# Patient Record
Sex: Female | Born: 1965 | Race: Black or African American | Hispanic: No | Marital: Single | Smoking: Current every day smoker
Health system: Southern US, Community
[De-identification: ages and names within clinical notes are randomized; demographics above are authoritative.]

## PROBLEM LIST (undated history)

## (undated) DIAGNOSIS — I739 Peripheral vascular disease, unspecified: Secondary | ICD-10-CM

## (undated) DIAGNOSIS — I10 Essential (primary) hypertension: Secondary | ICD-10-CM

## (undated) DIAGNOSIS — E119 Type 2 diabetes mellitus without complications: Secondary | ICD-10-CM

## (undated) DIAGNOSIS — I519 Heart disease, unspecified: Secondary | ICD-10-CM

## (undated) DIAGNOSIS — Z8619 Personal history of other infectious and parasitic diseases: Secondary | ICD-10-CM

## (undated) DIAGNOSIS — F32A Depression, unspecified: Secondary | ICD-10-CM

## (undated) DIAGNOSIS — I480 Paroxysmal atrial fibrillation: Secondary | ICD-10-CM

## (undated) DIAGNOSIS — F319 Bipolar disorder, unspecified: Secondary | ICD-10-CM

## (undated) DIAGNOSIS — E785 Hyperlipidemia, unspecified: Secondary | ICD-10-CM

## (undated) DIAGNOSIS — F329 Major depressive disorder, single episode, unspecified: Secondary | ICD-10-CM

## (undated) DIAGNOSIS — I499 Cardiac arrhythmia, unspecified: Secondary | ICD-10-CM

## (undated) HISTORY — DX: Paroxysmal atrial fibrillation: I48.0

## (undated) HISTORY — PX: WISDOM TOOTH EXTRACTION: SHX21

## (undated) HISTORY — DX: Peripheral vascular disease, unspecified: I73.9

## (undated) HISTORY — DX: Hyperlipidemia, unspecified: E78.5

## (undated) HISTORY — DX: Heart disease, unspecified: I51.9

## (undated) HISTORY — DX: Personal history of other infectious and parasitic diseases: Z86.19

## (undated) HISTORY — PX: MULTIPLE TOOTH EXTRACTIONS: SHX2053

---
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
## 1999-07-08 HISTORY — PX: ABDOMINAL HYSTERECTOMY: SHX81

## 2014-02-10 ENCOUNTER — Emergency Department (HOSPITAL_BASED_OUTPATIENT_CLINIC_OR_DEPARTMENT_OTHER)
Admission: EM | Admit: 2014-02-10 | Discharge: 2014-02-10 | Disposition: A | Discharge status: Home / Self Care | Payer: Managed Care, Other (non HMO) | Attending: Emergency Medicine | Admitting: Emergency Medicine

## 2014-02-10 ENCOUNTER — Encounter (HOSPITAL_BASED_OUTPATIENT_CLINIC_OR_DEPARTMENT_OTHER): Payer: Self-pay | Admitting: Emergency Medicine

## 2014-02-10 DIAGNOSIS — F17200 Nicotine dependence, unspecified, uncomplicated: Secondary | ICD-10-CM | POA: Insufficient documentation

## 2014-02-10 DIAGNOSIS — I10 Essential (primary) hypertension: Secondary | ICD-10-CM | POA: Insufficient documentation

## 2014-02-10 DIAGNOSIS — F329 Major depressive disorder, single episode, unspecified: Secondary | ICD-10-CM | POA: Insufficient documentation

## 2014-02-10 DIAGNOSIS — F3289 Other specified depressive episodes: Secondary | ICD-10-CM | POA: Insufficient documentation

## 2014-02-10 DIAGNOSIS — N390 Urinary tract infection, site not specified: Secondary | ICD-10-CM | POA: Insufficient documentation

## 2014-02-10 DIAGNOSIS — N898 Other specified noninflammatory disorders of vagina: Secondary | ICD-10-CM | POA: Insufficient documentation

## 2014-02-10 DIAGNOSIS — Z79899 Other long term (current) drug therapy: Secondary | ICD-10-CM | POA: Insufficient documentation

## 2014-02-10 DIAGNOSIS — A5901 Trichomonal vulvovaginitis: Secondary | ICD-10-CM

## 2014-02-10 HISTORY — DX: Major depressive disorder, single episode, unspecified: F32.9

## 2014-02-10 HISTORY — DX: Depression, unspecified: F32.A

## 2014-02-10 HISTORY — DX: Essential (primary) hypertension: I10

## 2014-02-10 LAB — URINALYSIS, ROUTINE W REFLEX MICROSCOPIC
BILIRUBIN URINE: NEGATIVE
Glucose, UA: NEGATIVE mg/dL
KETONES UR: NEGATIVE mg/dL
NITRITE: NEGATIVE
PH: 5 (ref 5.0–8.0)
PROTEIN: 30 mg/dL — AB
Specific Gravity, Urine: 1.03 (ref 1.005–1.030)
Urobilinogen, UA: 0.2 mg/dL (ref 0.0–1.0)

## 2014-02-10 LAB — URINE MICROSCOPIC-ADD ON

## 2014-02-10 LAB — WET PREP, GENITAL: YEAST WET PREP: NONE SEEN

## 2014-02-10 MED ORDER — CEFTRIAXONE SODIUM 250 MG IJ SOLR
250.0000 mg | Freq: Once | INTRAMUSCULAR | Status: AC
  Administered 2014-02-10: 250 mg via INTRAMUSCULAR
  Filled 2014-02-10: qty 250

## 2014-02-10 MED ORDER — METRONIDAZOLE 500 MG PO TABS
2000.0000 mg | ORAL_TABLET | Freq: Once | ORAL | Status: AC
  Administered 2014-02-10: 2000 mg via ORAL
  Filled 2014-02-10: qty 4

## 2014-02-10 MED ORDER — NITROFURANTOIN MONOHYD MACRO 100 MG PO CAPS: 100.0000 mg | ORAL_CAPSULE | Freq: Two times a day (BID) | ORAL | Status: DC

## 2014-02-10 MED ORDER — FLUCONAZOLE 150 MG PO TABS: 150.0000 mg | ORAL_TABLET | Freq: Every day | ORAL | Status: AC

## 2014-02-10 MED ORDER — ONDANSETRON 4 MG PO TBDP
4.0000 mg | ORAL_TABLET | Freq: Once | ORAL | Status: AC
  Administered 2014-02-10: 4 mg via ORAL
  Filled 2014-02-10: qty 1

## 2014-02-10 NOTE — Discharge Instructions (Signed)
Urinary Tract Infection °Urinary tract infections (UTIs) can develop anywhere along your urinary tract. Your urinary tract is your body's drainage system for removing wastes and extra water. Your urinary tract includes two kidneys, two ureters, a bladder, and a urethra. Your kidneys are a pair of bean-shaped organs. Each kidney is about the size of your fist. They are located below your ribs, one on each side of your spine. °CAUSES °Infections are caused by microbes, which are microscopic organisms, including fungi, viruses, and bacteria. These organisms are so small that they can only be seen through a microscope. Bacteria are the microbes that most commonly cause UTIs. °SYMPTOMS  °Symptoms of UTIs may vary by age and gender of the patient and by the location of the infection. Symptoms in young women typically include a frequent and intense urge to urinate and a painful, burning feeling in the bladder or urethra during urination. Older women and men are more likely to be tired, shaky, and weak and have muscle aches and abdominal pain. A fever may mean the infection is in your kidneys. Other symptoms of a kidney infection include pain in your back or sides below the ribs, nausea, and vomiting. °DIAGNOSIS °To diagnose a UTI, your caregiver will ask you about your symptoms. Your caregiver also will ask to provide a urine sample. The urine sample will be tested for bacteria and white blood cells. White blood cells are made by your body to help fight infection. °TREATMENT  °Typically, UTIs can be treated with medication. Because most UTIs are caused by a bacterial infection, they usually can be treated with the use of antibiotics. The choice of antibiotic and length of treatment depend on your symptoms and the type of bacteria causing your infection. °HOME CARE INSTRUCTIONS °· If you were prescribed antibiotics, take them exactly as your caregiver instructs you. Finish the medication even if you feel better after you  have only taken some of the medication. °· Drink enough water and fluids to keep your urine clear or pale yellow. °· Avoid caffeine, tea, and carbonated beverages. They tend to irritate your bladder. °· Empty your bladder often. Avoid holding urine for long periods of time. °· Empty your bladder before and after sexual intercourse. °· After a bowel movement, women should cleanse from front to back. Use each tissue only once. °SEEK MEDICAL CARE IF:  °· You have back pain. °· You develop a fever. °· Your symptoms do not begin to resolve within 3 days. °SEEK IMMEDIATE MEDICAL CARE IF:  °· You have severe back pain or lower abdominal pain. °· You develop chills. °· You have nausea or vomiting. °· You have continued burning or discomfort with urination. °MAKE SURE YOU:  °· Understand these instructions. °· Will watch your condition. °· Will get help right away if you are not doing well or get worse. °Document Released: 04/02/2005 Document Revised: 12/23/2011 Document Reviewed: 08/01/2011 °ExitCare® Patient Information ©2015 [employer]. This information is not intended to replace advice given to you by your health care provider. Make sure you discuss any questions you have with your health care provider. °Trichomoniasis °Trichomoniasis is an infection caused by an organism called Trichomonas. The infection can affect both women and men. In women, the outer female genitalia and the vagina are affected. In men, the penis is mainly affected, but the prostate and other reproductive organs can also be involved. Trichomoniasis is a sexually transmitted infection (STI) and is most often passed to another person through sexual contact.  °  RISK FACTORS °· Having unprotected sexual intercourse. °· Having sexual intercourse with an infected partner. °SIGNS AND SYMPTOMS  °Symptoms of trichomoniasis in women include: °· Abnormal gray-green frothy vaginal discharge. °· Itching and irritation of the vagina. °· Itching and irritation  of the area outside the vagina. °Symptoms of trichomoniasis in men include:  °· Penile discharge with or without pain. °· Pain during urination. This results from inflammation of the urethra. °DIAGNOSIS  °Trichomoniasis may be found during a Pap test or physical exam. Your health care provider may use one of the following methods to help diagnose this infection: °· Examining vaginal discharge under a microscope. For men, urethral discharge would be examined. °· Testing the pH of the vagina with a test tape. °· Using a vaginal swab test that checks for the Trichomonas organism. A test is available that provides results within a few minutes. °· Doing a culture test for the organism. This is not usually needed. °TREATMENT  °· You may be given medicine to fight the infection. Women should inform their health care provider if they could be or are pregnant. Some medicines used to treat the infection should not be taken during pregnancy. °· Your health care provider may recommend over-the-counter medicines or creams to decrease itching or irritation. °· Your sexual partner will need to be treated if infected. °HOME CARE INSTRUCTIONS  °· Take medicines only as directed by your health care provider. °· Take over-the-counter medicine for itching or irritation as directed by your health care provider. °· Do not have sexual intercourse while you have the infection. °· Women should not douche or wear tampons while they have the infection. °· Discuss your infection with your partner. Your partner may have gotten the infection from you, or you may have gotten it from your partner. °· Have your sex partner get examined and treated if necessary. °· Practice safe, informed, and protected sex. °· See your health care provider for other STI testing. °SEEK MEDICAL CARE IF:  °· You still have symptoms after you finish your medicine. °· You develop abdominal pain. °· You have pain when you urinate. °· You have bleeding after sexual  intercourse. °· You develop a rash. °· Your medicine makes you sick or makes you throw up (vomit). °MAKE SURE YOU: °· Understand these instructions. °· Will watch your condition. °· Will get help right away if you are not doing well or get worse. °Document Released: 12/17/2000 Document Revised: 11/07/2013 Document Reviewed: 04/04/2013 °ExitCare® Patient Information ©2015 [employer]. This information is not intended to replace advice given to you by your health care provider. Make sure you discuss any questions you have with your health care provider. ° °

## 2014-02-10 NOTE — ED Provider Notes (Signed)
CSN: [id number]     Arrival date & time 02/10/14  1754 History  This chart was scribed for Rachael Camel, MD by Luisa Dago, ED Scribe. This patient was seen in room MH01/MH01 and the patient's care was started at 7:12 PM.    Chief Complaint  Patient presents with  . Vaginal Discharge   The history is provided by the patient. No language interpreter was used.   HPI Comments: Rachael Becker is a 48 y.o. female who presents to the Emergency Department complaining of vaginal discharge that started approximately 4 days ago. Pt is also complaining of associated vaginal irritation and dysuria. Pt states that she's been using vagisil with no relief. She also states that last week she switched to a new detergent and that's when she noticed the vaginal irritation. Pt also states that she recently started having sex again and is concerned that the vaginal irritation could be due to her usage of latex condoms. Has been having some dysuria as well. She denies any other pertinent medical history. Denies any abdominal pain, back pain, vomiting or fevers.    Past Medical History  Diagnosis Date  . Hypertension   . Depression    Past Surgical History  Procedure Laterality Date  . Abdominal hysterectomy     No family history on file. History  Substance Use Topics  . Smoking status: Current Every Day Smoker  . Smokeless tobacco: Not on file  . Alcohol Use: Not on file   OB History   Grav Para Term Preterm Abortions TAB SAB Ect Mult Living                 Review of Systems  Constitutional: Negative for fever and chills.  Gastrointestinal: Negative for nausea, vomiting and abdominal pain.  Genitourinary: Positive for dysuria and vaginal discharge. Negative for flank pain.  Musculoskeletal: Negative for back pain.  All other systems reviewed and are negative.  Allergies  Review of patient's allergies indicates no known allergies.  Home Medications   Prior to Admission medications    Medication Sig Start Date End Date Taking? Authorizing Provider  carvedilol (COREG) 25 MG tablet Take 25 mg by mouth 2 (two) times daily with a meal.   Yes Historical Provider, MD  citalopram (CELEXA) 40 MG tablet Take 40 mg by mouth daily.   Yes Historical Provider, MD   BP 164/98  Pulse 77  Temp(Src) 97.5 F (36.4 C)  Resp 16  Ht  (1.651 m)  Wt 210 lb (95.255 kg)  BMI 34.95 kg/m2  SpO2 95%  Physical Exam  Nursing note and vitals reviewed. Constitutional: She is oriented to person, place, and time. She appears well-developed and well-nourished. No distress.  HENT:  Head: Normocephalic and atraumatic.  Eyes: EOM are normal.  Neck: Neck supple.  Cardiovascular: Normal rate, regular rhythm and normal heart sounds.   Pulmonary/Chest: Effort normal and breath sounds normal. No respiratory distress.  Abdominal: Soft. She exhibits no distension. There is no tenderness. There is no CVA tenderness.  Genitourinary: Uterus is not enlarged and not tender. Cervix exhibits no motion tenderness and no friability. Vaginal discharge (yellow) found.  Musculoskeletal: Normal range of motion.  Neurological: She is alert and oriented to person, place, and time.  Skin: Skin is warm and dry.  Psychiatric: She has a normal mood and affect. Her behavior is normal.    ED Course  Procedures (including critical care time)  DIAGNOSTIC STUDIES: Oxygen Saturation is 95% on RA, low by  my interpretation.    COORDINATION OF CARE: 7:17 PM- Will order a UA. Pt advised of plan for treatment and pt agrees.  Labs Review Labs Reviewed  WET PREP, GENITAL - Abnormal; Notable for the following:    Trich, Wet Prep MODERATE (*)    Clue Cells Wet Prep HPF POC FEW (*)    WBC, Wet Prep HPF POC TOO NUMEROUS TO COUNT (*)    All other components within normal limits  URINALYSIS, ROUTINE W REFLEX MICROSCOPIC - Abnormal; Notable for the following:    APPearance CLOUDY (*)    Hgb urine dipstick LARGE (*)     Protein, ur 30 (*)    Leukocytes, UA MODERATE (*)    All other components within normal limits  URINE MICROSCOPIC-ADD ON - Abnormal; Notable for the following:    Bacteria, UA MANY (*)    All other components within normal limits  GC/CHLAMYDIA PROBE AMP  URINE CULTURE    Imaging Review No results found.   EKG Interpretation None      MDM   Final diagnoses:  UTI (lower urinary tract infection)  Trichomonal vaginitis    Patient's wife is consistent with trichomoniasis. Given this she was also treated for GC/chlamydia. Tolerated meds well in the ED. We'll treat her likely UTI with macrobid. Patient has no signs of pyelonephritis, going to fever, flank/back pain, or at this time will discharge and recommend return if any of these symptoms develop.  I personally performed the services described in this documentation, which was scribed in my presence. The recorded information has been reviewed and is accurate.    Rachael Camel, MD 02/10/14 [phone]

## 2014-02-10 NOTE — ED Notes (Signed)
Pt having vaginal irritation and discharge since Tuesday.  Pt has recently changed detergent.  Pt states she is having pink tinged discharge.  Some dysuria.

## 2014-02-11 LAB — GC/CHLAMYDIA PROBE AMP
CT Probe RNA: NEGATIVE
GC Probe RNA: NEGATIVE

## 2014-02-12 LAB — URINE CULTURE: Colony Count: >=100000

## 2014-07-07 DIAGNOSIS — I2510 Atherosclerotic heart disease of native coronary artery without angina pectoris: Secondary | ICD-10-CM

## 2014-07-07 HISTORY — DX: Atherosclerotic heart disease of native coronary artery without angina pectoris: I25.10

## 2014-07-18 HISTORY — PX: BYPASS GRAFT: SHX909

## 2014-08-23 ENCOUNTER — Ambulatory Visit (INDEPENDENT_AMBULATORY_CARE_PROVIDER_SITE_OTHER): Payer: Managed Care, Other (non HMO) | Admitting: Psychology

## 2014-08-23 DIAGNOSIS — F4323 Adjustment disorder with mixed anxiety and depressed mood: Secondary | ICD-10-CM

## 2014-08-28 ENCOUNTER — Encounter: Payer: Self-pay | Admitting: Physician Assistant

## 2014-08-28 ENCOUNTER — Telehealth: Payer: Self-pay | Admitting: Physician Assistant

## 2014-08-28 ENCOUNTER — Ambulatory Visit (INDEPENDENT_AMBULATORY_CARE_PROVIDER_SITE_OTHER): Payer: Managed Care, Other (non HMO) | Admitting: Physician Assistant

## 2014-08-28 VITALS — BP 107/74 | HR 81 | Temp 98.4°F | Resp 16 | Ht 64.5 in | Wt 250.5 lb

## 2014-08-28 DIAGNOSIS — F419 Anxiety disorder, unspecified: Principal | ICD-10-CM

## 2014-08-28 DIAGNOSIS — E785 Hyperlipidemia, unspecified: Secondary | ICD-10-CM

## 2014-08-28 DIAGNOSIS — I25709 Atherosclerosis of coronary artery bypass graft(s), unspecified, with unspecified angina pectoris: Secondary | ICD-10-CM | POA: Insufficient documentation

## 2014-08-28 DIAGNOSIS — I10 Essential (primary) hypertension: Secondary | ICD-10-CM

## 2014-08-28 DIAGNOSIS — F418 Other specified anxiety disorders: Secondary | ICD-10-CM

## 2014-08-28 DIAGNOSIS — F32A Depression, unspecified: Secondary | ICD-10-CM

## 2014-08-28 DIAGNOSIS — F329 Major depressive disorder, single episode, unspecified: Secondary | ICD-10-CM | POA: Insufficient documentation

## 2014-08-28 MED ORDER — CITALOPRAM HYDROBROMIDE 40 MG PO TABS: 40.0000 mg | ORAL_TABLET | Freq: Every day | ORAL | Status: DC

## 2014-08-28 MED ORDER — ALPRAZOLAM 1 MG PO TABS: 1.0000 mg | ORAL_TABLET | Freq: Every evening | ORAL | Status: DC | PRN

## 2014-08-28 MED ORDER — LISINOPRIL 10 MG PO TABS: 10.0000 mg | ORAL_TABLET | Freq: Every day | ORAL | Status: DC

## 2014-08-28 MED ORDER — LISINOPRIL 20 MG PO TABS: 20.0000 mg | ORAL_TABLET | Freq: Every day | ORAL | Status: DC

## 2014-08-28 MED ORDER — CITALOPRAM HYDROBROMIDE 20 MG PO TABS: 20.0000 mg | ORAL_TABLET | Freq: Every day | ORAL | Status: DC

## 2014-08-28 MED ORDER — HYDROCHLOROTHIAZIDE 25 MG PO TABS: 25.0000 mg | ORAL_TABLET | Freq: Every day | ORAL | Status: AC

## 2014-08-28 MED ORDER — ATORVASTATIN CALCIUM 80 MG PO TABS: 40.0000 mg | ORAL_TABLET | Freq: Every day | ORAL | Status: DC

## 2014-08-28 NOTE — Patient Instructions (Signed)
Please continue medications as directed.  I will fax over a refill of your Xanax once I get confirmation on the dose from your pharmacy.   I would like for you to follow-up in 1 month to reassess your BP levels.  We can do your physical at that time.  Welcome to [facility]!  Hypertension Hypertension, commonly called high blood pressure, is when the force of blood pumping through your arteries is too strong. Your arteries are the blood vessels that carry blood from your heart throughout your body. A blood pressure reading consists of a higher number over a lower number, such as 110/72. The higher number (systolic) is the pressure inside your arteries when your heart pumps. The lower number (diastolic) is the pressure inside your arteries when your heart relaxes. Ideally you want your blood pressure below 120/80. Hypertension forces your heart to work harder to pump blood. Your arteries may become narrow or stiff. Having hypertension puts you at risk for heart disease, stroke, and other problems.  RISK FACTORS Some risk factors for high blood pressure are controllable. Others are not.  Risk factors you cannot control include:   Race. You may be at higher risk if you are African American.  Age. Risk increases with age.  Gender. Men are at higher risk than women before age 51 years. After age 51, women are at higher risk than men. Risk factors you can control include:  Not getting enough exercise or physical activity.  Being overweight.  Getting too much fat, sugar, calories, or salt in your diet.  Drinking too much alcohol. SIGNS AND SYMPTOMS Hypertension does not usually cause signs or symptoms. Extremely high blood pressure (hypertensive crisis) may cause headache, anxiety, shortness of breath, and nosebleed. DIAGNOSIS  To check if you have hypertension, your health care provider will measure your blood pressure while you are seated, with your arm held at the level of your heart. It should  be measured at least twice using the same arm. Certain conditions can cause a difference in blood pressure between your right and left arms. A blood pressure reading that is higher than normal on one occasion does not mean that you need treatment. If one blood pressure reading is high, ask your health care provider about having it checked again. TREATMENT  Treating high blood pressure includes making lifestyle changes and possibly taking medicine. Living a healthy lifestyle can help lower high blood pressure. You may need to change some of your habits. Lifestyle changes may include:  Following the DASH diet. This diet is high in fruits, vegetables, and whole grains. It is low in salt, red meat, and added sugars.  Getting at least 2 hours of brisk physical activity every week.  Losing weight if necessary.  Not smoking.  Limiting alcoholic beverages.  Learning ways to reduce stress. If lifestyle changes are not enough to get your blood pressure under control, your health care provider may prescribe medicine. You may need to take more than one. Work closely with your health care provider to understand the risks and benefits. HOME CARE INSTRUCTIONS  Have your blood pressure rechecked as directed by your health care provider.   Take medicines only as directed by your health care provider. Follow the directions carefully. Blood pressure medicines must be taken as prescribed. The medicine does not work as well when you skip doses. Skipping doses also puts you at risk for problems.   Do not smoke.   Monitor your blood pressure at home as directed  by your health care provider. SEEK MEDICAL CARE IF:   You think you are having a reaction to medicines taken.  You have recurrent headaches or feel dizzy.  You have swelling in your ankles.  You have trouble with your vision. SEEK IMMEDIATE MEDICAL CARE IF:  You develop a severe headache or confusion.  You have unusual weakness,  numbness, or feel faint.  You have severe chest or abdominal pain.  You vomit repeatedly.  You have trouble breathing. MAKE SURE YOU:   Understand these instructions.  Will watch your condition.  Will get help right away if you are not doing well or get worse. Document Released: 06/23/2005 Document Revised: 11/07/2013 Document Reviewed: 04/15/2013 [facility] Patient Information 2015 [place], [place]. This information is not intended to replace advice given to you by your health care provider. Make sure you discuss any questions you have with your health care provider.

## 2014-08-28 NOTE — Telephone Encounter (Signed)
Attempt to reach [facility] 3 times w/o answer this Am/SLS 02.22.16

## 2014-08-28 NOTE — Telephone Encounter (Signed)
Patient had stated at her appointment today that she was on Xanax PRN but did not know the dose.  Her pharmacy has faxed over all filled medication and it does not include Xanax.  Reviewing the [place] Controlled [place]bstance Database, it seem she was on Lorazepam 0.5 mg PRN previously.  I want to double check with her regarding the Lorazepam before I send in a refill.

## 2014-08-28 NOTE — Progress Notes (Signed)
Pre visit review using our clinic review tool, if applicable. No additional management support is needed unless otherwise documented below in the visit note/SLS  

## 2014-08-28 NOTE — Assessment & Plan Note (Signed)
Doing well.  Has scheduled follow-up with Cardiology. BP well controlled.  Continue cholesterol and BP regimen.  Continue 81 mg ASA daily.  Will return for CPE with fasting labs to assess cholesterol.

## 2014-08-28 NOTE — Telephone Encounter (Signed)
Please attempt to reach again as we need an updated med list to verify dosing of medications.

## 2014-08-28 NOTE — Assessment & Plan Note (Signed)
Well controlled. Medications refilled.  Continue current regimen.  Exercise encouraged.  Patient to return for CPE with fasting labs.

## 2014-08-28 NOTE — Assessment & Plan Note (Signed)
Medications refilled. Continue current regimen.  Diet and exercise regimen discussed.

## 2014-08-28 NOTE — Telephone Encounter (Signed)
Spoke with Pharmacist at [facility] listed in patient's chart. Has old Rx for Xanax 1 mg.  Will refill that to use PRN with quantity 30 to send to her pharmacy ([facility]).   Reviewing the medication list provided by [facility], it seems she was inaccurate about most of her medications, unless recent changes had been made by her Cardiologist. Please contact patient and have her use her old Rx bottles to help find correct medication doses:  It seems she should be taking 10 mg Lisinopril instead of 20 like she stated.  Since medication was refilled, she will need to take 1/2 tablet daily to get the right dose. For the Citalopram, they have her taking 20 mg daily instead of 40 mg daily.  If this is correct, she is to take 1/2 tablet daily to get the correct dose. For the Atorvastatin, [facility] states she has been getting 80 mg tablets of the Lipitor instead of 20 mg she reported.  If this is accurate, then I want her to at least take 2 tablets (40 mg daily) until I can recheck her cholesterol at her CPE.  She is to bring all Rx pill bottles to her appointments from now on.  I want her to return in 2 weeks for her CPE and medication management.

## 2014-08-28 NOTE — Telephone Encounter (Signed)
Spoke with patient and informed her of the fax that I received from [facility] with different dosing instructions for her medications and had patient get her bottles of medications to verify what was received from the pharmacy; resent Rx to pharmacy with new/correct dosing instructions. Patient understood & agreed/SLS 02.22.16

## 2014-08-28 NOTE — Assessment & Plan Note (Signed)
Citalopram refilled.  Continue daily as directed.  Will call pharmacy to assess previous Xanax dose.  Will refill based on what pharmacy ellicits. Follow-up in 1 month.

## 2014-08-28 NOTE — Progress Notes (Signed)
Patient presents to clinic today to establish care.  Patient with history of hypertension, hyperlipidemia and coronary artery disease s/p bypass grafting on 07/18/14.  Is currently on regimen of Carvedilol 25 mg BID, HCTZ 25 mg QD, and lisinopril 20 mg daily for BP.  Endorses good control of BP. Patient denies chest pain, palpitations, lightheadedness, dizziness, vision changes or frequent headaches. Is taking 81 mg ASA and Lipitor 20 mg daily.  Denies myalgias.   Is feeling slightly fatigued since her bypass but feels it is improving every day.  Is currently on Citalopram 40 mg daily for anxiety/depression.  States symptoms well-controlled overall but has been having some acute anxiety over the past couple of weeks.  States she was previously given Xanax PRN for panic attacks but cannot remember the dose.  Has not needed medication in many months.  Patient with upcoming appointment with Cardiothoracic Surgery on 08/30/14.  Past Medical History  Diagnosis Date  . Hypertension   . Depression   . History of chicken pox   . Hyperlipidemia   . Heart disease     Past Surgical History  Procedure Laterality Date  . Abdominal hysterectomy  2001  . Bypass graft  01.12.2016  . Tonsillectomy  1970  . Wisdom tooth extraction    . Multiple tooth extractions      Denturees    Current Outpatient Prescriptions on File Prior to Visit  Medication Sig Dispense Refill  . carvedilol (COREG) 25 MG tablet Take 25 mg by mouth 2 (two) times daily with a meal.     No current facility-administered medications on file prior to visit.    No Known Allergies  Family History  Problem Relation Age of Onset  . Stroke Father 2671    Deceased  . Depression Father   . Hypertension Father   . Hypertension Mother     Living  . Heart disease Father   . Hypertension Sister   . Healthy Son     x1  . Healthy Daughter     x1    History   Social History  . Marital Status: Single    Spouse Name: N/A  .  Number of Children: N/A  . Years of Education: N/A   Occupational History  . Not on file.   Social History Main Topics  . Smoking status: Former Smoker    Quit date: 07/07/2014  . Smokeless tobacco: Never Used  . Alcohol Use: 6.0 oz/week    10 Standard drinks or equivalent per week  . Drug Use: No  . Sexual Activity: Not Currently   Other Topics Concern  . Not on file   Social History Narrative   ROS Pertinent ROS are listed in the HPI.  BP 107/74 mmHg  Pulse 81  Temp(Src) 98.4 F (36.9 C) (Oral)  Resp 16  Ht 5' 4.5" (1.638 m)  Wt 250 lb 8 oz (113.626 kg)  BMI 42.35 kg/m2  SpO2 98%  Physical Exam  Constitutional: She is oriented to person, place, and time and well-developed, well-nourished, and in no distress.  HENT:  Head: Normocephalic and atraumatic.  Right Ear: External ear normal.  Left Ear: External ear normal.  Eyes: Conjunctivae are normal.  Neck: Neck supple.  Cardiovascular: Normal rate, regular rhythm, normal heart sounds and intact distal pulses.   Pulmonary/Chest: Effort normal and breath sounds normal. No respiratory distress. She has no wheezes. She has no rales. She exhibits no tenderness.  Neurological: She is alert and oriented to person,  place, and time.  Skin: Skin is warm and dry. No rash noted.  Psychiatric: Affect normal.  Vitals reviewed.  Assessment/Plan: Essential hypertension, benign Well controlled. Medications refilled.  Continue current regimen.  Exercise encouraged.  Patient to return for CPE with fasting labs.   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  Has scheduled follow-up with Cardiology. BP well controlled.  Continue cholesterol and BP regimen.  Continue 81 mg ASA daily.  Will return for CPE with fasting labs to assess cholesterol.   Anxiety and depression Citalopram refilled.  Continue daily as directed.  Will call pharmacy to assess previous  Xanax dose.  Will refill based on what pharmacy ellicits. Follow-up in 1 month.   Hyperlipidemia Medications refilled. Continue current regimen.  Diet and exercise regimen discussed.

## 2014-09-06 ENCOUNTER — Ambulatory Visit: Payer: Managed Care, Other (non HMO) | Admitting: Psychology

## 2014-09-08 ENCOUNTER — Telehealth: Payer: Self-pay

## 2014-09-08 NOTE — Telephone Encounter (Signed)
Message on phone states patient is not accepting calls at this time.

## 2014-09-11 ENCOUNTER — Encounter: Payer: Self-pay | Admitting: Physician Assistant

## 2014-09-11 ENCOUNTER — Ambulatory Visit (INDEPENDENT_AMBULATORY_CARE_PROVIDER_SITE_OTHER): Payer: Managed Care, Other (non HMO) | Admitting: Physician Assistant

## 2014-09-11 VITALS — BP 139/75 | HR 79 | Temp 98.2°F | Ht 66.25 in | Wt 250.8 lb

## 2014-09-11 DIAGNOSIS — F32A Depression, unspecified: Secondary | ICD-10-CM | POA: Insufficient documentation

## 2014-09-11 DIAGNOSIS — Z23 Encounter for immunization: Secondary | ICD-10-CM

## 2014-09-11 DIAGNOSIS — F329 Major depressive disorder, single episode, unspecified: Secondary | ICD-10-CM

## 2014-09-11 DIAGNOSIS — R5383 Other fatigue: Secondary | ICD-10-CM

## 2014-09-11 DIAGNOSIS — Z0000 Encounter for general adult medical examination without abnormal findings: Secondary | ICD-10-CM

## 2014-09-11 LAB — CBC
HCT: 36.9 % (ref 36.0–46.0)
HEMOGLOBIN: 11.9 g/dL — AB (ref 12.0–15.0)
MCHC: 32.2 g/dL (ref 30.0–36.0)
MCV: 80.8 fl (ref 78.0–100.0)
Platelets: 320 10*3/uL (ref 150.0–400.0)
RBC: 4.56 Mil/uL (ref 3.87–5.11)
RDW: 15.5 % (ref 11.5–15.5)
WBC: 8.7 10*3/uL (ref 4.0–10.5)

## 2014-09-11 LAB — BASIC METABOLIC PANEL
BUN: 9 mg/dL (ref 6–23)
CALCIUM: 9.3 mg/dL (ref 8.4–10.5)
CHLORIDE: 104 meq/L (ref 96–112)
CO2: 27 mEq/L (ref 19–32)
Creatinine, Ser: 0.71 mg/dL (ref 0.40–1.20)
GFR: 111.76 mL/min (ref >60.00)
GLUCOSE: 131 mg/dL — AB (ref 70–99)
Potassium: 4.1 mEq/L (ref 3.5–5.1)
Sodium: 138 mEq/L (ref 135–145)

## 2014-09-11 LAB — HEPATIC FUNCTION PANEL
ALBUMIN: 3.9 g/dL (ref 3.5–5.2)
ALT: 10 U/L (ref 0–35)
AST: 11 U/L (ref 0–37)
Alkaline Phosphatase: 74 U/L (ref 39–117)
BILIRUBIN DIRECT: 0.1 mg/dL (ref 0.0–0.3)
BILIRUBIN TOTAL: 0.4 mg/dL (ref 0.2–1.2)
TOTAL PROTEIN: 7.3 g/dL (ref 6.0–8.3)

## 2014-09-11 LAB — TSH: TSH: 1.2 u[IU]/mL (ref 0.35–4.50)

## 2014-09-11 LAB — VITAMIN D 25 HYDROXY (VIT D DEFICIENCY, FRACTURES): VITD: 8.49 ng/mL — ABNORMAL LOW (ref 30.00–100.00)

## 2014-09-11 LAB — LIPID PANEL
CHOL/HDL RATIO: 4
Cholesterol: 149 mg/dL (ref 0–200)
HDL: 38.5 mg/dL — AB (ref >39.00)
LDL CALC: 99 mg/dL (ref 0–99)
NonHDL: 110.5
TRIGLYCERIDES: 56 mg/dL (ref 0.0–149.0)
VLDL: 11.2 mg/dL (ref 0.0–40.0)

## 2014-09-11 LAB — HEMOGLOBIN A1C: Hgb A1c MFr Bld: 6.9 % — ABNORMAL HIGH (ref 4.6–6.5)

## 2014-09-11 NOTE — Assessment & Plan Note (Signed)
Will obtain labs today to further assess.  Increase Citalopram to 40 mg daily. Patient encouraged to pick up her Xanax and take as directed.  Per new office policy a CSC was signed by patient.  UDS will be obtained.  Follow-up in 1 month.

## 2014-09-11 NOTE — Addendum Note (Signed)
Addended by: Marcelline MatesMARTIN, Parsa Rickett on: 09/11/2014 09:04 AM   Modules accepted: [employer]

## 2014-09-11 NOTE — Progress Notes (Signed)
Patient presents to clinic today for annual exam.  Patient is fasting for labs.  Acute Concerns: Fatigue -- Patient is s/p CABG.  Also with history of depression, currently on Citalopram 20 mg daily.  Endorses some continued depression and anxiety.  Denies SI/HI.  Is wondering if symptoms could be stemming from a vitamin deficiency.   Chronic Issues: Hypertension -- Well controlled on current regimen. Patient denies chest pain, palpitations, lightheadedness, dizziness, vision changes or frequent headaches.  Health Maintenance: Dental -- Edentulous Vision -- up-to-date Immunizations -- Unsure of last Tetanus.   Colonoscopy -- Due Mammogram -- Last in June 2015.  No abnormal findings.  Due in June 2016. PAP -- s/p hysterectomy.  No hx of cervical cancer.   Past Medical History  Diagnosis Date  . Hypertension   . Depression   . History of chicken pox   . Hyperlipidemia   . Heart disease     Past Surgical History  Procedure Laterality Date  . Abdominal hysterectomy  2001  . Bypass graft  01.12.2016  . Tonsillectomy  1970  . Wisdom tooth extraction    . Multiple tooth extractions      Denturees    Current Outpatient Prescriptions on File Prior to Visit  Medication Sig Dispense Refill  . ALPRAZolam (XANAX) 1 MG tablet Take 1 tablet (1 mg total) by mouth at bedtime as needed for anxiety. 30 tablet 0  . aspirin 81 MG tablet Take 81 mg by mouth daily.    Marland Kitchen. atorvastatin (LIPITOR) 80 MG tablet Take 0.5 tablets (40 mg total) by mouth daily. 90 tablet 0  . carvedilol (COREG) 25 MG tablet Take 25 mg by mouth 2 (two) times daily with a meal.    . citalopram (CELEXA) 20 MG tablet Take 1 tablet (20 mg total) by mouth daily. 90 tablet 0  . hydrochlorothiazide (HYDRODIURIL) 25 MG tablet Take 1 tablet (25 mg total) by mouth daily. 90 tablet 1  . lisinopril (PRINIVIL,ZESTRIL) 10 MG tablet Take 1 tablet (10 mg total) by mouth daily. 90 tablet 0   No current facility-administered  medications on file prior to visit.    No Known Allergies  Family History  Problem Relation Age of Onset  . Stroke Father [id number]    Deceased  . Depression Father   . Hypertension Father   . Hypertension Mother     Living  . Heart disease Father   . Hypertension Sister   . Healthy Son     x1  . Healthy Daughter     x1    History   Social History  . Marital Status: Single    Spouse Name: N/A  . Number of Children: N/A  . Years of Education: N/A   Occupational History  . Not on file.   Social History Main Topics  . Smoking status: Former Smoker    Quit date: 07/07/2014  . Smokeless tobacco: Never Used  . Alcohol Use: 6.0 oz/week    10 Standard drinks or equivalent per week  . Drug Use: No  . Sexual Activity: Not Currently   Other Topics Concern  . Not on file   Social History Narrative   Review of Systems  Constitutional: Positive for malaise/fatigue. Negative for fever and weight loss.  HENT: Negative for ear discharge, ear pain, hearing loss and tinnitus.   Eyes: Negative for blurred vision, double vision, photophobia and pain.  Respiratory: Negative for cough and shortness of breath.   Cardiovascular: Negative for chest  pain and palpitations.  Gastrointestinal: Negative for heartburn, nausea, vomiting, abdominal pain, diarrhea, constipation, blood in stool and melena.  Genitourinary: Negative for dysuria, urgency, frequency, hematuria and flank pain.  Musculoskeletal: Negative for falls.  Neurological: Negative for dizziness, loss of consciousness and headaches.  Endo/Heme/Allergies: Negative for environmental allergies.  Psychiatric/Behavioral: Positive for depression. Negative for suicidal ideas, hallucinations and substance abuse. The patient is nervous/anxious. The patient does not have insomnia.    BP 139/75 mmHg  Pulse 79  Temp(Src) 98.2 F (36.8 C) (Oral)  Ht 5' 6.25" (1.683 m)  Wt 250 lb 12.8 oz (113.762 kg)  BMI 40.16 kg/m2  SpO2 99%  Physical  Exam  Constitutional: She is oriented to person, place, and time and well-developed, well-nourished, and in no distress.  HENT:  Head: Normocephalic and atraumatic.  Right Ear: External ear normal.  Left Ear: External ear normal.  Nose: Nose normal.  Mouth/Throat: Oropharynx is clear and moist. No oropharyngeal exudate.  TM within normal limits bilaterally.  Eyes: Conjunctivae are normal. Pupils are equal, round, and reactive to light.  Neck: Neck supple. No thyromegaly present.  Cardiovascular: Normal rate, regular rhythm, normal heart sounds and intact distal pulses.   Pulmonary/Chest: Effort normal and breath sounds normal. No respiratory distress. She has no wheezes. She has no rales. She exhibits no tenderness.  Abdominal: Soft. Bowel sounds are normal. She exhibits no distension and no mass. There is no tenderness. There is no rebound and no guarding.  Lymphadenopathy:    She has no cervical adenopathy.  Neurological: She is alert and oriented to person, place, and time.  Skin: Skin is warm and dry. No rash noted.  Psychiatric: Affect normal.  Vitals reviewed.  Assessment/Plan: Visit for preventive health examination I have reviewed the patient's medical history in detail and updated the computerized patient record. Health Maintenance discussed.  Due for TDaP.  TDaP given by nursing. Due for Colonoscopy.  Giving recent CT surgery, will hold off on colonoscopy for a couple of months until patient is feeling better.  Reminder placed in system to call patient the 1st of May to set up Colonoscopy.  Due for Mammogram in June 2016.  Will obtain fasting labs today. Preventive care discussed.  Handout given.   Fatigue due to depression Will obtain labs today to further assess.  Increase Citalopram to 40 mg daily. Patient encouraged to pick up her Xanax and take as directed.  Per new office policy a CSC was signed by patient.  UDS will be obtained.  Follow-up in 1 month.

## 2014-09-11 NOTE — Addendum Note (Signed)
Addended by: Verdie ShireBAYNES, ANGELA M on: 09/11/2014 03:58 PM   Modules accepted: Orders

## 2014-09-11 NOTE — Patient Instructions (Signed)
Please continue medications as directed. Can increase the Celexa to 40 mg.  You have a refill of your Xanax waiting at the pharmacy.  Please take as directed.  Stop by the lab for blood work.  I will call you with your results.  I will call you in a few months for a colonoscopy referral, after you have had time to adequately recover from heart surgery.  Follow-up 1 month.  Preventive Care for Adults A healthy lifestyle and preventive care can promote health and wellness. Preventive health guidelines for women include the following key practices.  A routine yearly physical is a good way to check with your health care provider about your health and preventive screening. It is a chance to share any concerns and updates on your health and to receive a thorough exam.  Visit your dentist for a routine exam and preventive care every 6 months. Brush your teeth twice a day and floss once a day. Good oral hygiene prevents tooth decay and gum disease.  The frequency of eye exams is based on your age, health, family medical history, use of contact lenses, and other factors. Follow your health care provider's recommendations for frequency of eye exams.  Eat a healthy diet. Foods like vegetables, fruits, whole grains, low-fat dairy products, and lean protein foods contain the nutrients you need without too many calories. Decrease your intake of foods high in solid fats, added sugars, and salt. Eat the right amount of calories for you.Get information about a proper diet from your health care provider, if necessary.  Regular physical exercise is one of the most important things you can do for your health. Most adults should get at least 150 minutes of moderate-intensity exercise (any activity that increases your heart rate and causes you to sweat) each week. In addition, most adults need muscle-strengthening exercises on 2 or more days a week.  Maintain a healthy weight. The body mass index (BMI) is a  screening tool to identify possible weight problems. It provides an estimate of body fat based on height and weight. Your health care provider can find your BMI and can help you achieve or maintain a healthy weight.For adults 20 years and older:  A BMI below 18.5 is considered underweight.  A BMI of 18.5 to 24.9 is normal.  A BMI of 25 to 29.9 is considered overweight.  A BMI of 30 and above is considered obese.  Maintain normal blood lipids and cholesterol levels by exercising and minimizing your intake of saturated fat. Eat a balanced diet with plenty of fruit and vegetables. Blood tests for lipids and cholesterol should begin at age 77 and be repeated every 5 years. If your lipid or cholesterol levels are high, you are over 50, or you are at high risk for heart disease, you may need your cholesterol levels checked more frequently.Ongoing high lipid and cholesterol levels should be treated with medicines if diet and exercise are not working.  If you smoke, find out from your health care provider how to quit. If you do not use tobacco, do not start.  Lung cancer screening is recommended for adults aged 57-80 years who are at high risk for developing lung cancer because of a history of smoking. A yearly low-dose CT scan of the lungs is recommended for people who have at least a 30-pack-year history of smoking and are a current smoker or have quit within the past 15 years. A pack year of smoking is smoking an average of 1  pack of cigarettes a day for 1 year (for example: 1 pack a day for 30 years or 2 packs a day for 15 years). Yearly screening should continue until the smoker has stopped smoking for at least 15 years. Yearly screening should be stopped for people who develop a health problem that would prevent them from having lung cancer treatment.  If you are pregnant, do not drink alcohol. If you are breastfeeding, be very cautious about drinking alcohol. If you are not pregnant and choose to  drink alcohol, do not have more than 1 drink per day. One drink is considered to be 12 ounces (355 mL) of beer, 5 ounces (148 mL) of wine, or 1.5 ounces (44 mL) of liquor.  Avoid use of street drugs. Do not share needles with anyone. Ask for help if you need support or instructions about stopping the use of drugs.  High blood pressure causes heart disease and increases the risk of stroke. Your blood pressure should be checked at least every 1 to 2 years. Ongoing high blood pressure should be treated with medicines if weight loss and exercise do not work.  If you are 61-66 years old, ask your health care provider if you should take aspirin to prevent strokes.  Diabetes screening involves taking a blood sample to check your fasting blood sugar level. This should be done once every 3 years, after age 14, if you are within normal weight and without risk factors for diabetes. Testing should be considered at a younger age or be carried out more frequently if you are overweight and have at least 1 risk factor for diabetes.  Breast cancer screening is essential preventive care for women. You should practice "breast self-awareness." This means understanding the normal appearance and feel of your breasts and may include breast self-examination. Any changes detected, no matter how small, should be reported to a health care provider. Women in their 75s and 30s should have a clinical breast exam (CBE) by a health care provider as part of a regular health exam every 1 to 3 years. After age 61, women should have a CBE every year. Starting at age 4, women should consider having a mammogram (breast X-ray test) every year. Women who have a family history of breast cancer should talk to their health care provider about genetic screening. Women at a high risk of breast cancer should talk to their health care providers about having an MRI and a mammogram every year.  Breast cancer gene (BRCA)-related cancer risk assessment  is recommended for women who have family members with BRCA-related cancers. BRCA-related cancers include breast, ovarian, tubal, and peritoneal cancers. Having family members with these cancers may be associated with an increased risk for harmful changes (mutations) in the breast cancer genes BRCA1 and BRCA2. Results of the assessment will determine the need for genetic counseling and BRCA1 and BRCA2 testing.  Routine pelvic exams to screen for cancer are no longer recommended for nonpregnant women who are considered low risk for cancer of the pelvic organs (ovaries, uterus, and vagina) and who do not have symptoms. Ask your health care provider if a screening pelvic exam is right for you.  If you have had past treatment for cervical cancer or a condition that could lead to cancer, you need Pap tests and screening for cancer for at least 20 years after your treatment. If Pap tests have been discontinued, your risk factors (such as having a new sexual partner) need to be reassessed to determine  if screening should be resumed. Some women have medical problems that increase the chance of getting cervical cancer. In these cases, your health care provider may recommend more frequent screening and Pap tests.  The HPV test is an additional test that may be used for cervical cancer screening. The HPV test looks for the virus that can cause the cell changes on the cervix. The cells collected during the Pap test can be tested for HPV. The HPV test could be used to screen women aged 37 years and older, and should be used in women of any age who have unclear Pap test results. After the age of 64, women should have HPV testing at the same frequency as a Pap test.  Colorectal cancer can be detected and often prevented. Most routine colorectal cancer screening begins at the age of 42 years and continues through age 26 years. However, your health care provider may recommend screening at an earlier age if you have risk  factors for colon cancer. On a yearly basis, your health care provider may provide home test kits to check for hidden blood in the stool. Use of a small camera at the end of a tube, to directly examine the colon (sigmoidoscopy or colonoscopy), can detect the earliest forms of colorectal cancer. Talk to your health care provider about this at age 9, when routine screening begins. Direct exam of the colon should be repeated every 5-10 years through age 105 years, unless early forms of pre-cancerous polyps or small growths are found.  People who are at an increased risk for hepatitis B should be screened for this virus. You are considered at high risk for hepatitis B if:  You were born in a country where hepatitis B occurs often. Talk with your health care provider about which countries are considered high risk.  Your parents were born in a high-risk country and you have not received a shot to protect against hepatitis B (hepatitis B vaccine).  You have HIV or AIDS.  You use needles to inject street drugs.  You live with, or have sex with, someone who has hepatitis B.  You get hemodialysis treatment.  You take certain medicines for conditions like cancer, organ transplantation, and autoimmune conditions.  Hepatitis C blood testing is recommended for all people born from [birth year] through 1965 and any individual with known risks for hepatitis C.  Practice safe sex. Use condoms and avoid high-risk sexual practices to reduce the spread of sexually transmitted infections (STIs). STIs include gonorrhea, chlamydia, syphilis, trichomonas, herpes, HPV, and human immunodeficiency virus (HIV). Herpes, HIV, and HPV are viral illnesses that have no cure. They can result in disability, cancer, and death.  You should be screened for sexually transmitted illnesses (STIs) including gonorrhea and chlamydia if:  You are sexually active and are younger than 24 years.  You are older than 24 years and your health care  provider tells you that you are at risk for this type of infection.  Your sexual activity has changed since you were last screened and you are at an increased risk for chlamydia or gonorrhea. Ask your health care provider if you are at risk.  If you are at risk of being infected with HIV, it is recommended that you take a prescription medicine daily to prevent HIV infection. This is called preexposure prophylaxis (PrEP). You are considered at risk if:  You are a heterosexual woman, are sexually active, and are at increased risk for HIV infection.  You take drugs  by injection.  You are sexually active with a partner who has HIV.  Talk with your health care provider about whether you are at high risk of being infected with HIV. If you choose to begin PrEP, you should first be tested for HIV. You should then be tested every 3 months for as long as you are taking PrEP.  Osteoporosis is a disease in which the bones lose minerals and strength with aging. This can result in serious bone fractures or breaks. The risk of osteoporosis can be identified using a bone density scan. Women ages 20 years and over and women at risk for fractures or osteoporosis should discuss screening with their health care providers. Ask your health care provider whether you should take a calcium supplement or vitamin D to reduce the rate of osteoporosis.  Menopause can be associated with physical symptoms and risks. Hormone replacement therapy is available to decrease symptoms and risks. You should talk to your health care provider about whether hormone replacement therapy is right for you.  Use sunscreen. Apply sunscreen liberally and repeatedly throughout the day. You should seek shade when your shadow is shorter than you. Protect yourself by wearing long sleeves, pants, a wide-brimmed hat, and sunglasses year round, whenever you are outdoors.  Once a month, do a whole body skin exam, using a mirror to look at the skin on  your back. Tell your health care provider of new moles, moles that have irregular borders, moles that are larger than a pencil eraser, or moles that have changed in shape or color.  Stay current with required vaccines (immunizations).  Influenza vaccine. All adults should be immunized every year.  Tetanus, diphtheria, and acellular pertussis (Td, Tdap) vaccine. Pregnant women should receive 1 dose of Tdap vaccine during each pregnancy. The dose should be obtained regardless of the length of time since the last dose. Immunization is preferred during the 27th-36th week of gestation. An adult who has not previously received Tdap or who does not know her vaccine status should receive 1 dose of Tdap. This initial dose should be followed by tetanus and diphtheria toxoids (Td) booster doses every 10 years. Adults with an unknown or incomplete history of completing a 3-dose immunization series with Td-containing vaccines should begin or complete a primary immunization series including a Tdap dose. Adults should receive a Td booster every 10 years.  Varicella vaccine. An adult without evidence of immunity to varicella should receive 2 doses or a second dose if she has previously received 1 dose. Pregnant females who do not have evidence of immunity should receive the first dose after pregnancy. This first dose should be obtained before leaving the health care facility. The second dose should be obtained 4-8 weeks after the first dose.  Human papillomavirus (HPV) vaccine. Females aged 13-26 years who have not received the vaccine previously should obtain the 3-dose series. The vaccine is not recommended for use in pregnant females. However, pregnancy testing is not needed before receiving a dose. If a female is found to be pregnant after receiving a dose, no treatment is needed. In that case, the remaining doses should be delayed until after the pregnancy. Immunization is recommended for any person with an  immunocompromised condition through the age of 22 years if she did not get any or all doses earlier. During the 3-dose series, the second dose should be obtained 4-8 weeks after the first dose. The third dose should be obtained 24 weeks after the first dose and 16  weeks after the second dose.  Zoster vaccine. One dose is recommended for adults aged 37 years or older unless certain conditions are present.  Measles, mumps, and rubella (MMR) vaccine. Adults born before [birth year] generally are considered immune to measles and mumps. Adults born in [birth year] or later should have 1 or more doses of MMR vaccine unless there is a contraindication to the vaccine or there is laboratory evidence of immunity to each of the three diseases. A routine second dose of MMR vaccine should be obtained at least 28 days after the first dose for students attending postsecondary schools, health care workers, or international travelers. People who received inactivated measles vaccine or an unknown type of measles vaccine during 1963-1967 should receive 2 doses of MMR vaccine. People who received inactivated mumps vaccine or an unknown type of mumps vaccine before 1979 and are at high risk for mumps infection should consider immunization with 2 doses of MMR vaccine. For females of childbearing age, rubella immunity should be determined. If there is no evidence of immunity, females who are not pregnant should be vaccinated. If there is no evidence of immunity, females who are pregnant should delay immunization until after pregnancy. Unvaccinated health care workers born before [birth year] who lack laboratory evidence of measles, mumps, or rubella immunity or laboratory confirmation of disease should consider measles and mumps immunization with 2 doses of MMR vaccine or rubella immunization with 1 dose of MMR vaccine.  Pneumococcal 13-valent conjugate (PCV13) vaccine. When indicated, a person who is uncertain of her immunization history and has no record  of immunization should receive the PCV13 vaccine. An adult aged 70 years or older who has certain medical conditions and has not been previously immunized should receive 1 dose of PCV13 vaccine. This PCV13 should be followed with a dose of pneumococcal polysaccharide (PPSV23) vaccine. The PPSV23 vaccine dose should be obtained at least 8 weeks after the dose of PCV13 vaccine. An adult aged 58 years or older who has certain medical conditions and previously received 1 or more doses of PPSV23 vaccine should receive 1 dose of PCV13. The PCV13 vaccine dose should be obtained 1 or more years after the last PPSV23 vaccine dose.  Pneumococcal polysaccharide (PPSV23) vaccine. When PCV13 is also indicated, PCV13 should be obtained first. All adults aged 15 years and older should be immunized. An adult younger than age 90 years who has certain medical conditions should be immunized. Any person who resides in a nursing home or long-term care facility should be immunized. An adult smoker should be immunized. People with an immunocompromised condition and certain other conditions should receive both PCV13 and PPSV23 vaccines. People with human immunodeficiency virus (HIV) infection should be immunized as soon as possible after diagnosis. Immunization during chemotherapy or radiation therapy should be avoided. Routine use of PPSV23 vaccine is not recommended for American Indians, [place] Natives, or people younger than 65 years unless there are medical conditions that require PPSV23 vaccine. When indicated, people who have unknown immunization and have no record of immunization should receive PPSV23 vaccine. One-time revaccination 5 years after the first dose of PPSV23 is recommended for people aged 19-64 years who have chronic kidney failure, nephrotic syndrome, asplenia, or immunocompromised conditions. People who received 1-2 doses of PPSV23 before age 52 years should receive another dose of PPSV23 vaccine at age 35 years or  later if at least 5 years have passed since the previous dose. Doses of PPSV23 are not needed for people immunized with PPSV23 at or  after age 83 years.  Meningococcal vaccine. Adults with asplenia or persistent complement component deficiencies should receive 2 doses of quadrivalent meningococcal conjugate (MenACWY-D) vaccine. The doses should be obtained at least 2 months apart. Microbiologists working with certain meningococcal bacteria, [employer] recruits, people at risk during an outbreak, and people who travel to or live in countries with a high rate of meningitis should be immunized. A first-year college student up through age 63 years who is living in a residence hall should receive a dose if she did not receive a dose on or after her 16th birthday. Adults who have certain high-risk conditions should receive one or more doses of vaccine.  Hepatitis A vaccine. Adults who wish to be protected from this disease, have certain high-risk conditions, work with hepatitis A-infected animals, work in hepatitis A research labs, or travel to or work in countries with a high rate of hepatitis A should be immunized. Adults who were previously unvaccinated and who anticipate close contact with an international adoptee during the first 60 days after arrival in the [place] from a country with a high rate of hepatitis A should be immunized.  Hepatitis B vaccine. Adults who wish to be protected from this disease, have certain high-risk conditions, may be exposed to blood or other infectious body fluids, are household contacts or sex partners of hepatitis B positive people, are clients or workers in certain care facilities, or travel to or work in countries with a high rate of hepatitis B should be immunized.  Haemophilus influenzae type b (Hib) vaccine. A previously unvaccinated person with asplenia or sickle cell disease or having a scheduled splenectomy should receive 1 dose of Hib vaccine. Regardless of  previous immunization, a recipient of a hematopoietic stem cell transplant should receive a 3-dose series 6-12 months after her successful transplant. Hib vaccine is not recommended for adults with HIV infection. Preventive Services / Frequency Ages 37 to 38 years  Blood pressure check.** / Every 1 to 2 years.  Lipid and cholesterol check.** / Every 5 years beginning at age 34.  Clinical breast exam.** / Every 3 years for women in their 62s and 6s.  BRCA-related cancer risk assessment.** / For women who have family members with a BRCA-related cancer (breast, ovarian, tubal, or peritoneal cancers).  Pap test.** / Every 2 years from ages 32 through 24. Every 3 years starting at age 64 through age 68 or 61 with a history of 3 consecutive normal Pap tests.  HPV screening.** / Every 3 years from ages 13 through ages 82 to 61 with a history of 3 consecutive normal Pap tests.  Hepatitis C blood test.** / For any individual with known risks for hepatitis C.  Skin self-exam. / Monthly.  Influenza vaccine. / Every year.  Tetanus, diphtheria, and acellular pertussis (Tdap, Td) vaccine.** / Consult your health care provider. Pregnant women should receive 1 dose of Tdap vaccine during each pregnancy. 1 dose of Td every 10 years.  Varicella vaccine.** / Consult your health care provider. Pregnant females who do not have evidence of immunity should receive the first dose after pregnancy.  HPV vaccine. / 3 doses over 6 months, if 26 and younger. The vaccine is not recommended for use in pregnant females. However, pregnancy testing is not needed before receiving a dose.  Measles, mumps, rubella (MMR) vaccine.** / You need at least 1 dose of MMR if you were born in 1957 or later. You may also need a 2nd dose. For females of  childbearing age, rubella immunity should be determined. If there is no evidence of immunity, females who are not pregnant should be vaccinated. If there is no evidence of immunity,  females who are pregnant should delay immunization until after pregnancy.  Pneumococcal 13-valent conjugate (PCV13) vaccine.** / Consult your health care provider.  Pneumococcal polysaccharide (PPSV23) vaccine.** / 1 to 2 doses if you smoke cigarettes or if you have certain conditions.  Meningococcal vaccine.** / 1 dose if you are age 5 to 64 years and a Market researcher living in a residence hall, or have one of several medical conditions, you need to get vaccinated against meningococcal disease. You may also need additional booster doses.  Hepatitis A vaccine.** / Consult your health care provider.  Hepatitis B vaccine.** / Consult your health care provider.  Haemophilus influenzae type b (Hib) vaccine.** / Consult your health care provider. Ages 52 to 53 years  Blood pressure check.** / Every 1 to 2 years.  Lipid and cholesterol check.** / Every 5 years beginning at age 59 years.  Lung cancer screening. / Every year if you are aged 69-80 years and have a 30-pack-year history of smoking and currently smoke or have quit within the past 15 years. Yearly screening is stopped once you have quit smoking for at least 15 years or develop a health problem that would prevent you from having lung cancer treatment.  Clinical breast exam.** / Every year after age 93 years.  BRCA-related cancer risk assessment.** / For women who have family members with a BRCA-related cancer (breast, ovarian, tubal, or peritoneal cancers).  Mammogram.** / Every year beginning at age 54 years and continuing for as long as you are in good health. Consult with your health care provider.  Pap test.** / Every 3 years starting at age 24 years through age 61 or 46 years with a history of 3 consecutive normal Pap tests.  HPV screening.** / Every 3 years from ages 1 years through ages 46 to 65 years with a history of 3 consecutive normal Pap tests.  Fecal occult blood test (FOBT) of stool. / Every year  beginning at age 69 years and continuing until age 63 years. You may not need to do this test if you get a colonoscopy every 10 years.  Flexible sigmoidoscopy or colonoscopy.** / Every 5 years for a flexible sigmoidoscopy or every 10 years for a colonoscopy beginning at age 92 years and continuing until age 30 years.  Hepatitis C blood test.** / For all people born from [birth year] through 1967 and any individual with known risks for hepatitis C.  Skin self-exam. / Monthly.  Influenza vaccine. / Every year.  Tetanus, diphtheria, and acellular pertussis (Tdap/Td) vaccine.** / Consult your health care provider. Pregnant women should receive 1 dose of Tdap vaccine during each pregnancy. 1 dose of Td every 10 years.  Varicella vaccine.** / Consult your health care provider. Pregnant females who do not have evidence of immunity should receive the first dose after pregnancy.  Zoster vaccine.** / 1 dose for adults aged 53 years or older.  Measles, mumps, rubella (MMR) vaccine.** / You need at least 1 dose of MMR if you were born in 1957 or later. You may also need a 2nd dose. For females of childbearing age, rubella immunity should be determined. If there is no evidence of immunity, females who are not pregnant should be vaccinated. If there is no evidence of immunity, females who are pregnant should delay immunization until after pregnancy.  Pneumococcal 13-valent conjugate (PCV13) vaccine.** / Consult your health care provider.  Pneumococcal polysaccharide (PPSV23) vaccine.** / 1 to 2 doses if you smoke cigarettes or if you have certain conditions.  Meningococcal vaccine.** / Consult your health care provider.  Hepatitis A vaccine.** / Consult your health care provider.  Hepatitis B vaccine.** / Consult your health care provider.  Haemophilus influenzae type b (Hib) vaccine.** / Consult your health care provider. Ages 58 years and over  Blood pressure check.** / Every 1 to 2 years.  Lipid and  cholesterol check.** / Every 5 years beginning at age 46 years.  Lung cancer screening. / Every year if you are aged 50-80 years and have a 30-pack-year history of smoking and currently smoke or have quit within the past 15 years. Yearly screening is stopped once you have quit smoking for at least 15 years or develop a health problem that would prevent you from having lung cancer treatment.  Clinical breast exam.** / Every year after age 68 years.  BRCA-related cancer risk assessment.** / For women who have family members with a BRCA-related cancer (breast, ovarian, tubal, or peritoneal cancers).  Mammogram.** / Every year beginning at age 43 years and continuing for as long as you are in good health. Consult with your health care provider.  Pap test.** / Every 3 years starting at age 55 years through age 53 or 95 years with 3 consecutive normal Pap tests. Testing can be stopped between 65 and 70 years with 3 consecutive normal Pap tests and no abnormal Pap or HPV tests in the past 10 years.  HPV screening.** / Every 3 years from ages 21 years through ages 29 or 12 years with a history of 3 consecutive normal Pap tests. Testing can be stopped between 65 and 70 years with 3 consecutive normal Pap tests and no abnormal Pap or HPV tests in the past 10 years.  Fecal occult blood test (FOBT) of stool. / Every year beginning at age 60 years and continuing until age 37 years. You may not need to do this test if you get a colonoscopy every 10 years.  Flexible sigmoidoscopy or colonoscopy.** / Every 5 years for a flexible sigmoidoscopy or every 10 years for a colonoscopy beginning at age 50 years and continuing until age 75 years.  Hepatitis C blood test.** / For all people born from [birth year] through 1967 and any individual with known risks for hepatitis C.  Osteoporosis screening.** / A one-time screening for women ages 72 years and over and women at risk for fractures or osteoporosis.  Skin self-exam. /  Monthly.  Influenza vaccine. / Every year.  Tetanus, diphtheria, and acellular pertussis (Tdap/Td) vaccine.** / 1 dose of Td every 10 years.  Varicella vaccine.** / Consult your health care provider.  Zoster vaccine.** / 1 dose for adults aged 76 years or older.  Pneumococcal 13-valent conjugate (PCV13) vaccine.** / Consult your health care provider.  Pneumococcal polysaccharide (PPSV23) vaccine.** / 1 dose for all adults aged 11 years and older.  Meningococcal vaccine.** / Consult your health care provider.  Hepatitis A vaccine.** / Consult your health care provider.  Hepatitis B vaccine.** / Consult your health care provider.  Haemophilus influenzae type b (Hib) vaccine.** / Consult your health care provider. ** Family history and personal history of risk and conditions may change your health care provider's recommendations. Document Released: 08/19/2001 Document Revised: 11/07/2013 Document Reviewed: 11/18/2010 [facility] Patient Information 2015 [place], [place]. This information is not intended to replace advice  to you by your health care provider. Make sure you discuss any questions you have with your health care provider.  

## 2014-09-11 NOTE — Assessment & Plan Note (Signed)
I have reviewed the patient's medical history in detail and updated the computerized patient record. Health Maintenance discussed.  Due for TDaP.  TDaP given by nursing. Due for Colonoscopy.  Giving recent CT surgery, will hold off on colonoscopy for a couple of months until patient is feeling better.  Reminder placed in system to call patient the 1st of May to set up Colonoscopy.  Due for Mammogram in June 2016.  Will obtain fasting labs today. Preventive care discussed.  Handout given.

## 2014-09-11 NOTE — Progress Notes (Signed)
Pre visit review using our clinic review tool, if applicable. No additional management support is needed unless otherwise documented below in the visit note. 

## 2014-09-12 LAB — URINALYSIS, ROUTINE W REFLEX MICROSCOPIC
Bilirubin Urine: NEGATIVE
Hgb urine dipstick: NEGATIVE
Ketones, ur: NEGATIVE
LEUKOCYTES UA: NEGATIVE
NITRITE: NEGATIVE
RBC / HPF: NONE SEEN (ref 0–?)
SPECIFIC GRAVITY, URINE: 1.015 (ref 1.000–1.030)
Total Protein, Urine: NEGATIVE
Urine Glucose: NEGATIVE
Urobilinogen, UA: 0.2 (ref 0.0–1.0)
pH: 5.5 (ref 5.0–8.0)

## 2014-09-15 ENCOUNTER — Telehealth: Payer: Self-pay | Admitting: Physician Assistant

## 2014-09-15 ENCOUNTER — Ambulatory Visit (INDEPENDENT_AMBULATORY_CARE_PROVIDER_SITE_OTHER): Payer: Managed Care, Other (non HMO) | Admitting: Psychology

## 2014-09-15 DIAGNOSIS — F4323 Adjustment disorder with mixed anxiety and depressed mood: Secondary | ICD-10-CM | POA: Diagnosis not present

## 2014-09-15 DIAGNOSIS — E559 Vitamin D deficiency, unspecified: Secondary | ICD-10-CM

## 2014-09-15 MED ORDER — VITAMIN D (ERGOCALCIFEROL) 1.25 MG (50000 UNIT) PO CAPS: 50000.0000 [IU] | ORAL_CAPSULE | ORAL | Status: DC

## 2014-09-15 NOTE — Telephone Encounter (Signed)
Patient informed, understood & agreed/SLS  

## 2014-09-15 NOTE — Telephone Encounter (Signed)
Lab results are in.  Want to talk to her regarding as it was high.  She has appointment Monday with me so we can discuss in detail at that time.  Other labs good overall.  Her Vitamin D level is very low which is contributing to her fatigue, etc.  I have sent in a Rx for Vitamin D 50,000 unit pill.  She is to take one tablet every 7 days for 10 weeks.

## 2014-09-18 ENCOUNTER — Encounter: Payer: Self-pay | Admitting: Physician Assistant

## 2014-09-18 ENCOUNTER — Ambulatory Visit (INDEPENDENT_AMBULATORY_CARE_PROVIDER_SITE_OTHER): Payer: Managed Care, Other (non HMO) | Admitting: Physician Assistant

## 2014-09-18 VITALS — BP 132/98 | HR 89 | Temp 98.4°F | Resp 16 | Ht 64.5 in | Wt 249.0 lb

## 2014-09-18 DIAGNOSIS — F418 Other specified anxiety disorders: Secondary | ICD-10-CM

## 2014-09-18 DIAGNOSIS — F32A Depression, unspecified: Secondary | ICD-10-CM

## 2014-09-18 DIAGNOSIS — F419 Anxiety disorder, unspecified: Principal | ICD-10-CM

## 2014-09-18 DIAGNOSIS — R7309 Other abnormal glucose: Secondary | ICD-10-CM | POA: Insufficient documentation

## 2014-09-18 DIAGNOSIS — F329 Major depressive disorder, single episode, unspecified: Secondary | ICD-10-CM

## 2014-09-18 MED ORDER — VENLAFAXINE HCL ER 37.5 MG PO CP24: 37.5000 mg | ORAL_CAPSULE | Freq: Every day | ORAL | Status: DC

## 2014-09-18 MED ORDER — ALPRAZOLAM 1 MG PO TABS: 1.0000 mg | ORAL_TABLET | Freq: Two times a day (BID) | ORAL | Status: DC | PRN

## 2014-09-18 NOTE — Progress Notes (Signed)
Patient presents to clinic today for medication management regarding anxiety and depression, as recommended by her Counselor.  At last visit, patient was placed on combination of Celexa 20 mg and Xanax 1 mg.  Patient endorses worsening anxiety and depressed mood. Denies SI/HI.Marland Kitchen  Patient also here to discuss recent A1C results.  A1c on 09/11/14 was at 6.9, indicating potential for diabetes.  Patient denies prior history of elevated blood glucose.  + family history.    Past Medical History  Diagnosis Date  . Hypertension   . Depression   . History of chicken pox   . Hyperlipidemia   . Heart disease     Current Outpatient Prescriptions on File Prior to Visit  Medication Sig Dispense Refill  . aspirin 81 MG tablet Take 81 mg by mouth daily.    Marland Kitchen atorvastatin (LIPITOR) 80 MG tablet Take 0.5 tablets (40 mg total) by mouth daily. 90 tablet 0  . carvedilol (COREG) 25 MG tablet Take 25 mg by mouth 2 (two) times daily with a meal.    . hydrochlorothiazide (HYDRODIURIL) 25 MG tablet Take 1 tablet (25 mg total) by mouth daily. 90 tablet 1  . lisinopril (PRINIVIL,ZESTRIL) 10 MG tablet Take 1 tablet (10 mg total) by mouth daily. 90 tablet 0  . Vitamin D, Ergocalciferol, (DRISDOL) 50000 UNITS CAPS capsule Take 1 capsule (50,000 Units total) by mouth every 7 (seven) days. 10 capsule 0   No current facility-administered medications on file prior to visit.    No Known Allergies  Family History  Problem Relation Age of Onset  . Stroke Father 67    Deceased  . Depression Father   . Hypertension Father   . Hypertension Mother     Living  . Heart disease Father   . Hypertension Sister   . Healthy Son     x1  . Healthy Daughter     x1    History   Social History  . Marital Status: Single    Spouse Name: N/A  . Number of Children: N/A  . Years of Education: N/A   Social History Main Topics  . Smoking status: Former Smoker    Quit date: 07/07/2014  . Smokeless tobacco: Never Used  .  Alcohol Use: 6.0 oz/week    10 Standard drinks or equivalent per week  . Drug Use: No  . Sexual Activity: Not Currently   Other Topics Concern  . None   Social History Narrative   Review of Systems - See HPI.  All other ROS are negative.  BP 132/98 mmHg  Pulse 89  Temp(Src) 98.4 F (36.9 C) (Oral)  Resp 16  Ht 5' 4.5" (1.638 m)  Wt 249 lb (112.946 kg)  BMI 42.10 kg/m2  SpO2 100%  Physical Exam  Constitutional: She is oriented to person, place, and time and well-developed, well-nourished, and in no distress.  HENT:  Head: Normocephalic and atraumatic.  Eyes: Conjunctivae are normal.  Neck: Neck supple.  Cardiovascular: Normal rate, regular rhythm, normal heart sounds and intact distal pulses.   Neurological: She is alert and oriented to person, place, and time.  Skin: Skin is warm and dry.  Vitals reviewed.   Recent Results (from the past 2160 hour(s))  CBC     Status: Abnormal   Collection Time: 09/11/14  9:12 AM  Result Value Ref Range   WBC 8.7 4.0 - 10.5 K/uL   RBC 4.56 3.87 - 5.11 Mil/uL   Platelets 320.0 150.0 - 400.0 K/uL  Hemoglobin 11.9 (L) 12.0 - 15.0 g/dL   HCT 16.1 09.6 - 04.5 %   MCV 80.8 78.0 - 100.0 fl   MCHC 32.2 30.0 - 36.0 g/dL   RDW 40.9 81.1 - 91.4 %  Basic metabolic panel     Status: Abnormal   Collection Time: 09/11/14  9:12 AM  Result Value Ref Range   Sodium 138 135 - 145 mEq/L   Potassium 4.1 3.5 - 5.1 mEq/L   Chloride 104 96 - 112 mEq/L   CO2 27 19 - 32 mEq/L   Glucose, Bld 131 (H) 70 - 99 mg/dL   BUN 9 6 - 23 mg/dL   Creatinine, Ser 7.82 0.40 - 1.20 mg/dL   Calcium 9.3 8.4 - 95.6 mg/dL   GFR 213.08 >65.78 mL/min  Hemoglobin A1c     Status: Abnormal   Collection Time: 09/11/14  9:12 AM  Result Value Ref Range   Hgb A1c MFr Bld 6.9 (H) 4.6 - 6.5 %    Comment: Glycemic Control Guidelines for People with Diabetes:Non Diabetic:  <6%Goal of Therapy: <7%Additional Action Suggested:  >8%   Hepatic function panel     Status: None    Collection Time: 09/11/14  9:12 AM  Result Value Ref Range   Total Bilirubin 0.4 0.2 - 1.2 mg/dL   Bilirubin, Direct 0.1 0.0 - 0.3 mg/dL   Alkaline Phosphatase 74 39 - 117 U/L   AST 11 0 - 37 U/L   ALT 10 0 - 35 U/L   Total Protein 7.3 6.0 - 8.3 g/dL   Albumin 3.9 3.5 - 5.2 g/dL  TSH     Status: None   Collection Time: 09/11/14  9:12 AM  Result Value Ref Range   TSH 1.20 0.35 - 4.50 uIU/mL  Urinalysis, Routine w reflex microscopic     Status: Abnormal   Collection Time: 09/11/14  9:12 AM  Result Value Ref Range   Color, Urine YELLOW Yellow;Lt. Yellow   APPearance CLEAR Clear   Specific Gravity, Urine 1.015 1.000-1.030   pH 5.5 5.0 - 8.0   Total Protein, Urine NEGATIVE Negative   Urine Glucose NEGATIVE Negative   Ketones, ur NEGATIVE Negative   Bilirubin Urine NEGATIVE Negative   Hgb urine dipstick NEGATIVE Negative   Urobilinogen, UA 0.2 0.0 - 1.0   Leukocytes, UA NEGATIVE Negative   Nitrite NEGATIVE Negative   WBC, UA 0-2/hpf 0-2/hpf   RBC / HPF none seen 0-2/hpf   Mucus, UA Presence of (A) None   Squamous Epithelial / LPF Rare(0-4/hpf) Rare(0-4/hpf)   Bacteria, UA Rare(<10/hpf) (A) None  Lipid panel     Status: Abnormal   Collection Time: 09/11/14  9:12 AM  Result Value Ref Range   Cholesterol 149 0 - 200 mg/dL    Comment: ATP III Classification       Desirable:  < 200 mg/dL               Borderline High:  200 - 239 mg/dL          High:  > = 469 mg/dL   Triglycerides 62.9 0.0 - 149.0 mg/dL    Comment: Normal:  <528 mg/dLBorderline High:  150 - 199 mg/dL   HDL 41.32 (L) >44.01 mg/dL   VLDL 02.7 0.0 - 25.3 mg/dL   LDL Cholesterol 99 0 - 99 mg/dL   Total CHOL/HDL Ratio 4     Comment:                Men  Women1/2 Average Risk     3.4          3.3Average Risk          5.0          4.42X Average Risk          9.6          7.13X Average Risk          15.0          11.0                       NonHDL 110.50     Comment: NOTE:  Non-HDL goal should be 30 mg/dL higher than  patient's LDL goal (i.e. LDL goal of < 70 mg/dL, would have non-HDL goal of < 100 mg/dL)  Vitamin D (25 hydroxy)     Status: Abnormal   Collection Time: 09/11/14  9:12 AM  Result Value Ref Range   VITD 8.49 (L) 30.00 - 100.00 ng/mL    Assessment/Plan: Anxiety and depression Will stop Celexa.  Will begin Effexor XR 37.5 mg daily. Will refill Xanax -- 1 mg up to BID prn.  Will follow-up in 2 weeks via phone to assess how she is doing.  Follow-up in office 1 month.   Elevated hemoglobin A1c Mild. Possibly due to cortisol elevation from recent CT surgery.  Diet and exercise discussed.  Will recheck in 3 months.

## 2014-09-18 NOTE — Patient Instructions (Signed)
Please take Xanax up to twice daily as needed for acute anxiety. Stop the Celexa (Citalopram) Start the Effexor (Venlafaxine XR) daily as directed. I will call you in 2 weeks to see how you are doing. We will increase dose if needed.  If you notice any worsening of your symptoms on this medication, please stop the medicine and call the office.  IF you have any thoughts of harming yourself or others, please call 911.  Keep up on the diet and exercise.  We will recheck your A1C in 3 months.

## 2014-09-18 NOTE — Progress Notes (Signed)
Pre visit review using our clinic review tool, if applicable. No additional management support is needed unless otherwise documented below in the visit note/SLS  

## 2014-09-18 NOTE — Assessment & Plan Note (Signed)
Mild. Possibly due to cortisol elevation from recent CT surgery.  Diet and exercise discussed.  Will recheck in 3 months.

## 2014-09-18 NOTE — Assessment & Plan Note (Signed)
Will stop Celexa.  Will begin Effexor XR 37.5 mg daily. Will refill Xanax -- 1 mg up to BID prn.  Will follow-up in 2 weeks via phone to assess how she is doing.  Follow-up in office 1 month.

## 2014-09-19 ENCOUNTER — Telehealth: Payer: Self-pay | Admitting: Physician Assistant

## 2014-09-19 DIAGNOSIS — F419 Anxiety disorder, unspecified: Principal | ICD-10-CM

## 2014-09-19 DIAGNOSIS — F329 Major depressive disorder, single episode, unspecified: Secondary | ICD-10-CM

## 2014-09-19 DIAGNOSIS — F32A Depression, unspecified: Secondary | ICD-10-CM

## 2014-09-19 MED ORDER — VENLAFAXINE HCL ER 37.5 MG PO CP24: 37.5000 mg | ORAL_CAPSULE | Freq: Every day | ORAL | Status: DC

## 2014-09-19 MED ORDER — ALPRAZOLAM 1 MG PO TABS: 1.0000 mg | ORAL_TABLET | Freq: Two times a day (BID) | ORAL | Status: AC | PRN

## 2014-09-19 NOTE — Telephone Encounter (Signed)
Done

## 2014-09-19 NOTE — Telephone Encounter (Signed)
Caller name: Wendi SnipesGibson, Veleka Relation to pt: SELF  Call back number: [phone] [facility] [phone]  Reason for call:   [facility] but will cover venlafaxine XR (EFFEXOR XR) 37.5 MG 24 hr capsule & ALPRAZolam (XANAX) 1 MG tablet [[id number]][[id number]]  If its sent to  [facility] [facility] [place] - [address] [phone] (Phone) [phone] (Fax)

## 2014-09-26 ENCOUNTER — Encounter: Payer: Managed Care, Other (non HMO) | Admitting: Physician Assistant

## 2014-10-03 ENCOUNTER — Encounter: Payer: Self-pay | Admitting: Physician Assistant

## 2014-10-03 ENCOUNTER — Ambulatory Visit: Payer: Managed Care, Other (non HMO) | Admitting: Physician Assistant

## 2014-10-03 ENCOUNTER — Ambulatory Visit (INDEPENDENT_AMBULATORY_CARE_PROVIDER_SITE_OTHER): Payer: Managed Care, Other (non HMO) | Admitting: Physician Assistant

## 2014-10-03 VITALS — BP 126/72 | HR 95 | Temp 98.4°F | Resp 16 | Ht 64.5 in | Wt 247.0 lb

## 2014-10-03 DIAGNOSIS — F418 Other specified anxiety disorders: Secondary | ICD-10-CM

## 2014-10-03 DIAGNOSIS — F419 Anxiety disorder, unspecified: Principal | ICD-10-CM

## 2014-10-03 DIAGNOSIS — F32A Depression, unspecified: Secondary | ICD-10-CM

## 2014-10-03 DIAGNOSIS — F329 Major depressive disorder, single episode, unspecified: Secondary | ICD-10-CM

## 2014-10-03 MED ORDER — VENLAFAXINE HCL ER 75 MG PO CP24: 75.0000 mg | ORAL_CAPSULE | Freq: Every day | ORAL | Status: DC

## 2014-10-03 NOTE — Progress Notes (Signed)
Pre visit review using our clinic review tool, if applicable. No additional management support is needed unless otherwise documented below in the visit note/SLS  

## 2014-10-03 NOTE — Patient Instructions (Signed)
Please started the new dose of the Effexor daily as directed. Continue all other medications as prescribed. Try to stay active, but don't overexert yourself. Follow-up with the cardiothoracic surgeon as scheduled.  Follow-up with me in one month.

## 2014-10-06 ENCOUNTER — Ambulatory Visit: Payer: Managed Care, Other (non HMO) | Admitting: Psychology

## 2014-10-06 NOTE — Progress Notes (Signed)
Patient presents to clinic today  For follow-up of anxiety and depression after starting Effexor XR 37.5 mg daily.  Patient endorses taking medication as directed. Has started to notice some improvement in her symptoms, but is still struggling with anxiety. Is having to take her Xanax a couple times per day. Denies panic attack. Denies suicidal thought or ideation.  Past Medical History  Diagnosis Date  . Hypertension   . Depression   . History of chicken pox   . Hyperlipidemia   . Heart disease     Current Outpatient Prescriptions on File Prior to Visit  Medication Sig Dispense Refill  . ALPRAZolam (XANAX) 1 MG tablet Take 1 tablet (1 mg total) by mouth 2 (two) times daily as needed for anxiety. 60 tablet 0  . aspirin 81 MG tablet Take 81 mg by mouth daily.    Marland Kitchen atorvastatin (LIPITOR) 80 MG tablet Take 0.5 tablets (40 mg total) by mouth daily. 90 tablet 0  . carvedilol (COREG) 25 MG tablet Take 25 mg by mouth 2 (two) times daily with a meal.    . hydrochlorothiazide (HYDRODIURIL) 25 MG tablet Take 1 tablet (25 mg total) by mouth daily. 90 tablet 1  . lisinopril (PRINIVIL,ZESTRIL) 10 MG tablet Take 1 tablet (10 mg total) by mouth daily. 90 tablet 0  . Vitamin D, Ergocalciferol, (DRISDOL) 50000 UNITS CAPS capsule Take 1 capsule (50,000 Units total) by mouth every 7 (seven) days. 10 capsule 0   No current facility-administered medications on file prior to visit.    No Known Allergies  Family History  Problem Relation Age of Onset  . Stroke Father 91    Deceased  . Depression Father   . Hypertension Father   . Hypertension Mother     Living  . Heart disease Father   . Hypertension Sister   . Healthy Son     x1  . Healthy Daughter     x1    History   Social History  . Marital Status: Single    Spouse Name: N/A  . Number of Children: N/A  . Years of Education: N/A   Social History Main Topics  . Smoking status: Former Smoker    Quit date: 07/07/2014  . Smokeless  tobacco: Never Used  . Alcohol Use: 6.0 oz/week    10 Standard drinks or equivalent per week  . Drug Use: No  . Sexual Activity: Not Currently   Other Topics Concern  . None   Social History Narrative    Review of Systems - See HPI.  All other ROS are negative.  BP 126/72 mmHg  Pulse 95  Temp(Src) 98.4 F (36.9 C) (Oral)  Resp 16  Ht 5' 4.5" (1.638 m)  Wt 247 lb (112.038 kg)  BMI 41.76 kg/m2  SpO2 100%  Physical Exam  Constitutional: She is oriented to person, place, and time and well-developed, well-nourished, and in no distress.  HENT:  Head: Normocephalic and atraumatic.  Eyes: Conjunctivae are normal.  Cardiovascular: Normal rate, regular rhythm, normal heart sounds and intact distal pulses.   Pulmonary/Chest: Effort normal and breath sounds normal. No respiratory distress. She has no wheezes. She has no rales. She exhibits no tenderness.  Neurological: She is alert and oriented to person, place, and time.  Skin: Skin is warm and dry. No rash noted.  Psychiatric: Affect normal.  Vitals reviewed.   Recent Results (from the past 2160 hour(s))  CBC     Status: Abnormal   Collection  Time: 09/11/14  9:12 AM  Result Value Ref Range   WBC 8.7 4.0 - 10.5 K/uL   RBC 4.56 3.87 - 5.11 Mil/uL   Platelets 320.0 150.0 - 400.0 K/uL   Hemoglobin 11.9 (L) 12.0 - 15.0 g/dL   HCT 04.5 40.9 - 81.1 %   MCV 80.8 78.0 - 100.0 fl   MCHC 32.2 30.0 - 36.0 g/dL   RDW 91.4 78.2 - 95.6 %  Basic metabolic panel     Status: Abnormal   Collection Time: 09/11/14  9:12 AM  Result Value Ref Range   Sodium 138 135 - 145 mEq/L   Potassium 4.1 3.5 - 5.1 mEq/L   Chloride 104 96 - 112 mEq/L   CO2 27 19 - 32 mEq/L   Glucose, Bld 131 (H) 70 - 99 mg/dL   BUN 9 6 - 23 mg/dL   Creatinine, Ser 2.13 0.40 - 1.20 mg/dL   Calcium 9.3 8.4 - 08.6 mg/dL   GFR 578.46 >96.29 mL/min  Hemoglobin A1c     Status: Abnormal   Collection Time: 09/11/14  9:12 AM  Result Value Ref Range   Hgb A1c MFr Bld 6.9 (H)  4.6 - 6.5 %    Comment: Glycemic Control Guidelines for People with Diabetes:Non Diabetic:  <6%Goal of Therapy: <7%Additional Action Suggested:  >8%   Hepatic function panel     Status: None   Collection Time: 09/11/14  9:12 AM  Result Value Ref Range   Total Bilirubin 0.4 0.2 - 1.2 mg/dL   Bilirubin, Direct 0.1 0.0 - 0.3 mg/dL   Alkaline Phosphatase 74 39 - 117 U/L   AST 11 0 - 37 U/L   ALT 10 0 - 35 U/L   Total Protein 7.3 6.0 - 8.3 g/dL   Albumin 3.9 3.5 - 5.2 g/dL  TSH     Status: None   Collection Time: 09/11/14  9:12 AM  Result Value Ref Range   TSH 1.20 0.35 - 4.50 uIU/mL  Urinalysis, Routine w reflex microscopic     Status: Abnormal   Collection Time: 09/11/14  9:12 AM  Result Value Ref Range   Color, Urine YELLOW Yellow;Lt. Yellow   APPearance CLEAR Clear   Specific Gravity, Urine 1.015 1.000-1.030   pH 5.5 5.0 - 8.0   Total Protein, Urine NEGATIVE Negative   Urine Glucose NEGATIVE Negative   Ketones, ur NEGATIVE Negative   Bilirubin Urine NEGATIVE Negative   Hgb urine dipstick NEGATIVE Negative   Urobilinogen, UA 0.2 0.0 - 1.0   Leukocytes, UA NEGATIVE Negative   Nitrite NEGATIVE Negative   WBC, UA 0-2/hpf 0-2/hpf   RBC / HPF none seen 0-2/hpf   Mucus, UA Presence of (A) None   Squamous Epithelial / LPF Rare(0-4/hpf) Rare(0-4/hpf)   Bacteria, UA Rare(<10/hpf) (A) None  Lipid panel     Status: Abnormal   Collection Time: 09/11/14  9:12 AM  Result Value Ref Range   Cholesterol 149 0 - 200 mg/dL    Comment: ATP III Classification       Desirable:  < 200 mg/dL               Borderline High:  200 - 239 mg/dL          High:  > = 528 mg/dL   Triglycerides 41.3 0.0 - 149.0 mg/dL    Comment: Normal:  <244 mg/dLBorderline High:  150 - 199 mg/dL   HDL 01.02 (L) >72.53 mg/dL   VLDL 66.4 0.0 - 40.3 mg/dL  LDL Cholesterol 99 0 - 99 mg/dL   Total CHOL/HDL Ratio 4     Comment:                Men          Women1/2 Average Risk     3.4          3.3Average Risk          5.0           4.42X Average Risk          9.6          7.13X Average Risk          15.0          11.0                       NonHDL 110.50     Comment: NOTE:  Non-HDL goal should be 30 mg/dL higher than patient's LDL goal (i.e. LDL goal of < 70 mg/dL, would have non-HDL goal of < 100 mg/dL)  Vitamin D (25 hydroxy)     Status: Abnormal   Collection Time: 09/11/14  9:12 AM  Result Value Ref Range   VITD 8.49 (L) 30.00 - 100.00 ng/mL    Assessment/Plan: Anxiety and depression  Improving but not quite there yet. We'll increase Effexor XR to 75 mg daily. Continue Xanax as directed. Follow-up with counseling as scheduled. Follow-up in one month.

## 2014-10-06 NOTE — Assessment & Plan Note (Signed)
Improving but not quite there yet. We'll increase Effexor XR to 75 mg daily. Continue Xanax as directed. Follow-up with counseling as scheduled. Follow-up in one month.

## 2014-10-09 ENCOUNTER — Telehealth: Payer: Self-pay | Admitting: Physician Assistant

## 2014-10-09 DIAGNOSIS — Z1211 Encounter for screening for malignant neoplasm of colon: Secondary | ICD-10-CM

## 2014-10-09 NOTE — Telephone Encounter (Signed)
Please inform patient I went ahead with referral to GI for screening colonoscopy.  She will be contacted to schedule.

## 2014-10-10 NOTE — Telephone Encounter (Signed)
Referral Notes     Type Date User   General 10/09/2014 3:07 PM HARRIS, CHRISTY D        Summary   Auto: Referral message      Note   ----- Message -----    From: [name]    Sent: 10/09/2014  3:06 PM     To: Rachael DarbyMarjorie H Farquhar   Spoke w/pt---wants to sch'd at an Office in [place].   Thanks     [facility], [facility] [place] [facility] [place], per patient request/SLS

## 2014-10-13 ENCOUNTER — Ambulatory Visit: Payer: Self-pay | Admitting: Physician Assistant

## 2014-11-03 ENCOUNTER — Ambulatory Visit: Payer: Managed Care, Other (non HMO) | Admitting: Physician Assistant

## 2014-11-03 DIAGNOSIS — Z0289 Encounter for other administrative examinations: Secondary | ICD-10-CM

## 2014-11-15 ENCOUNTER — Ambulatory Visit: Payer: Managed Care, Other (non HMO) | Admitting: Psychology

## 2014-12-25 ENCOUNTER — Ambulatory Visit: Payer: Managed Care, Other (non HMO) | Admitting: Physician Assistant

## 2014-12-27 ENCOUNTER — Encounter: Payer: Self-pay | Admitting: Physician Assistant

## 2014-12-27 ENCOUNTER — Telehealth: Payer: Self-pay | Admitting: Physician Assistant

## 2014-12-27 NOTE — Telephone Encounter (Signed)
Charge. 

## 2014-12-27 NOTE — Telephone Encounter (Signed)
Pt was no show 12/25/14 2:30pm, follow up 15 appt, appt has not been rescheduled, charge?

## 2014-12-29 ENCOUNTER — Telehealth: Payer: Self-pay

## 2014-12-29 NOTE — Telephone Encounter (Signed)
Pt signed ROI received via fax from [employer]. Forwarded to [place] to scan/email to medical records.

## 2015-07-12 ENCOUNTER — Encounter (HOSPITAL_BASED_OUTPATIENT_CLINIC_OR_DEPARTMENT_OTHER): Payer: Self-pay | Admitting: *Deleted

## 2015-07-12 ENCOUNTER — Emergency Department (HOSPITAL_BASED_OUTPATIENT_CLINIC_OR_DEPARTMENT_OTHER)
Admission: EM | Admit: 2015-07-12 | Discharge: 2015-07-12 | Disposition: A | Discharge status: Home / Self Care | Payer: Managed Care, Other (non HMO) | Attending: Emergency Medicine | Admitting: Emergency Medicine

## 2015-07-12 DIAGNOSIS — F329 Major depressive disorder, single episode, unspecified: Secondary | ICD-10-CM | POA: Insufficient documentation

## 2015-07-12 DIAGNOSIS — Z951 Presence of aortocoronary bypass graft: Secondary | ICD-10-CM | POA: Diagnosis not present

## 2015-07-12 DIAGNOSIS — G8929 Other chronic pain: Secondary | ICD-10-CM | POA: Diagnosis not present

## 2015-07-12 DIAGNOSIS — R200 Anesthesia of skin: Secondary | ICD-10-CM | POA: Insufficient documentation

## 2015-07-12 DIAGNOSIS — Z8619 Personal history of other infectious and parasitic diseases: Secondary | ICD-10-CM | POA: Diagnosis not present

## 2015-07-12 DIAGNOSIS — Z87891 Personal history of nicotine dependence: Secondary | ICD-10-CM | POA: Diagnosis not present

## 2015-07-12 DIAGNOSIS — I10 Essential (primary) hypertension: Secondary | ICD-10-CM | POA: Diagnosis not present

## 2015-07-12 DIAGNOSIS — E785 Hyperlipidemia, unspecified: Secondary | ICD-10-CM | POA: Insufficient documentation

## 2015-07-12 DIAGNOSIS — Z7982 Long term (current) use of aspirin: Secondary | ICD-10-CM | POA: Diagnosis not present

## 2015-07-12 DIAGNOSIS — Z79899 Other long term (current) drug therapy: Secondary | ICD-10-CM | POA: Insufficient documentation

## 2015-07-12 DIAGNOSIS — M545 Low back pain, unspecified: Secondary | ICD-10-CM

## 2015-07-12 MED ORDER — HYDROCODONE-ACETAMINOPHEN 5-325 MG PO TABS: 1.0000 | ORAL_TABLET | Freq: Four times a day (QID) | ORAL | Status: DC | PRN

## 2015-07-12 NOTE — ED Notes (Signed)
walking in triage with cane Pt c/o chronic leg pain x 3 months

## 2015-07-12 NOTE — ED Provider Notes (Signed)
CSN: [id number]     Arrival date & time 07/12/15  2059 History  By signing my name below, I, Jarvis Morganaylor Ferguson, attest that this documentation has been prepared under the direction and in the presence of Paula LibraJohn Feliz Herard, MD. Electronically Signed: Jarvis Morganaylor Ferguson, ED Scribe. 07/12/2015. 11:43 PM.    Chief Complaint  Patient presents with  . Pain   The history is provided by the patient. No language interpreter was used.    HPI Comments: Rachael Becker is a 50 y.o. female with a h/o chronic pain who presents to the Emergency Department complaining of moderate to severe, lower back pain that radiates from her tailbone down into right right leg for 2-3 months that has worsened over the last 3 weeks. She rates her pain as a 10/10 at today's visit. She states she walks with a cane for stability because she feels like when she walks her "right leg is going to give out". Pt endorses associated numbness in her right lower leg. She admits she has been taken Tylenol with no significant relief at this time. Pt was given a narcotic rx back in November 2016 and [facility] [place], she has not had any other prescriptions for narcotics since early last year. Pt notes she has attempted to see a specialist about this but has been unable to get an appt. She denies any other associated symptoms at this time.   Past Medical History  Diagnosis Date  . Hypertension   . Depression   . History of chicken pox   . Hyperlipidemia   . Heart disease    Past Surgical History  Procedure Laterality Date  . Abdominal hysterectomy  2001  . Bypass graft  01.12.2016  . Tonsillectomy  1970  . Wisdom tooth extraction    . Multiple tooth extractions      Denturees   Family History  Problem Relation Age of Onset  . Stroke Father [id number]    Deceased  . Depression Father   . Hypertension Father   . Hypertension Mother     Living  . Heart disease Father   . Hypertension Sister   . Healthy Son     x1   . Healthy Daughter     x1   Social History  Substance Use Topics  . Smoking status: Former Smoker    Quit date: 07/07/2014  . Smokeless tobacco: Never Used  . Alcohol Use: 6.0 oz/week    10 Standard drinks or equivalent per week   OB History    No data available     Review of Systems 10 Systems reviewed and all are negative for acute change except as noted in the HPI.  Allergies  Review of patient's allergies indicates no known allergies.  Home Medications   Prior to Admission medications   Medication Sig Start Date End Date Taking? Authorizing Provider  ALPRAZolam Prudy Feeler(XANAX) 1 MG tablet Take 1 tablet (1 mg total) by mouth 2 (two) times daily as needed for anxiety. 09/19/14   Waldon MerlWilliam C Martin, PA-C  aspirin 81 MG tablet Take 81 mg by mouth daily.    Historical Provider, MD  atorvastatin (LIPITOR) 80 MG tablet Take 0.5 tablets (40 mg total) by mouth daily. 08/28/14   Waldon MerlWilliam C Martin, PA-C  carvedilol (COREG) 25 MG tablet Take 25 mg by mouth 2 (two) times daily with a meal.    Historical Provider, MD  hydrochlorothiazide (HYDRODIURIL) 25 MG tablet Take 1 tablet (25 mg total) by mouth daily.  08/28/14   Waldon Merl, PA-C  HYDROcodone-acetaminophen (NORCO/VICODIN) 5-325 MG tablet Take 1-2 tablets by mouth every 6 (six) hours as needed (for pain; may cause constipation). 07/12/15   Kalia Vahey, MD  lisinopril (PRINIVIL,ZESTRIL) 10 MG tablet Take 1 tablet (10 mg total) by mouth daily. 08/28/14   Waldon Merl, PA-C  venlafaxine XR (EFFEXOR-XR) 75 MG 24 hr capsule Take 1 capsule (75 mg total) by mouth daily with breakfast. 10/03/14   Waldon Merl, PA-C  Vitamin D, Ergocalciferol, (DRISDOL) 50000 UNITS CAPS capsule Take 1 capsule (50,000 Units total) by mouth every 7 (seven) days. 09/15/14   Waldon Merl, PA-C   Triage Vitals: BP 115/113 mmHg  Pulse 108  Temp(Src) 98.2 F (36.8 C)  Resp 16  SpO2 98%  Physical Exam  Nursing note and vitals reviewed.  General: Well-developed,  well-nourished female in no acute distress; appearance consistent with age of record HENT: normocephalic; atraumatic Eyes: pupils equal, round and reactive to light; extraocular muscles intact Neck: supple Heart: regular rate and rhythm Lungs: clear to auscultation bilaterally Abdomen: soft; nondistended; nontender; bowel sounds present Back: lower lumbar tenderness; positive SLR on left at 30 degrees; positive SLR on right at 25 degrees Extremities: No deformity; full range of motion except hips limited by pain; pulses normal Neurologic: Awake, alert and oriented; decreased sensation in right lower leg and foot; motor function intact and symmetric in all extremities except +4 out of 5 in the right lower extremity; no facial droop;  Skin: Warm and dry Psychiatric: Normal mood and affect   ED Course  Procedures (including critical care time)   MDM  We'll provide a short course of narcotic analgesia and refer to neurosurgery.  Final diagnoses:  Chronic low back pain   I personally performed the services described in this documentation, which was scribed in my presence. The recorded information has been reviewed and is accurate.    Paula Libra, MD 07/12/15 [phone]

## 2016-10-22 ENCOUNTER — Other Ambulatory Visit: Payer: Self-pay | Admitting: Neurological Surgery

## 2016-10-22 DIAGNOSIS — M4316 Spondylolisthesis, lumbar region: Secondary | ICD-10-CM

## 2016-11-08 ENCOUNTER — Ambulatory Visit
Admission: RE | Admit: 2016-11-08 | Discharge: 2016-11-08 | Disposition: A | Discharge status: Home / Self Care | Payer: BLUE CROSS/BLUE SHIELD | Source: Ambulatory Visit | Attending: Neurological Surgery | Admitting: Neurological Surgery

## 2016-11-08 DIAGNOSIS — M4316 Spondylolisthesis, lumbar region: Secondary | ICD-10-CM

## 2016-11-08 MED ORDER — GADOBENATE DIMEGLUMINE 529 MG/ML IV SOLN
20.0000 mL | Freq: Once | INTRAVENOUS | Status: AC | PRN
  Administered 2016-11-08: 20 mL via INTRAVENOUS

## 2016-12-16 ENCOUNTER — Other Ambulatory Visit: Payer: Self-pay | Admitting: Neurological Surgery

## 2017-01-08 NOTE — Pre-Procedure Instructions (Signed)
    Rachael Becker  01/08/2017      [facility] [address] - [place], [place] - [address] [place] [address] Phone: [phone] Fax: [phone]  [facility] [address] [place], [place] - [id number] [place] [address] Phone: [phone] Fax: [phone]    Your procedure is scheduled on Friday, July 13th   [facility] Admitting at 8:30 AM             (posted surgery time 10:27 am - 1:54 pm)   Call this number if you have problems the morning of surgery:  [phone], for other questions, call [phone] Mon - Fri from 8-4pm   Remember:   Do not eat food or drink liquids after midnight Thursday.              4-5 days prior to surgery, STOP taking any Vitamins, Herbal Supplements, Anti=inflammatories.   Take these medicines the morning of surgery with A SIP OF WATER : Xanax, Carvedilol, Oxycodone, Effexor.   Do not wear jewelry, make-up or nail polish.  Do not wear lotions, powders, perfumes, or deoderant.  Do not shave 48 hours prior to surgery.    Do not bring valuables to the hospital.  [facility] is not responsible for any belongings or valuables.  Contacts, dentures or bridgework may not be worn into surgery.  Leave your suitcase in the car.  After surgery it may be brought to your room.  For patients admitted to the hospital, discharge time will be determined by your treatment team.  Patients discharged the day of surgery will not be allowed to drive home.   Please read over the following fact sheets that you were given. Pain Booklet, MRSA Information and Surgical Site Infection Prevention

## 2017-01-09 ENCOUNTER — Encounter (HOSPITAL_COMMUNITY): Payer: Self-pay

## 2017-01-09 ENCOUNTER — Encounter (HOSPITAL_COMMUNITY)
Admission: RE | Admit: 2017-01-09 | Discharge: 2017-01-09 | Disposition: A | Discharge status: Home / Self Care | Payer: BLUE CROSS/BLUE SHIELD | Source: Ambulatory Visit | Attending: Neurological Surgery | Admitting: Neurological Surgery

## 2017-01-09 ENCOUNTER — Ambulatory Visit (HOSPITAL_COMMUNITY)
Admission: RE | Admit: 2017-01-09 | Discharge: 2017-01-09 | Disposition: A | Discharge status: Home / Self Care | Payer: BLUE CROSS/BLUE SHIELD | Source: Ambulatory Visit | Attending: Neurological Surgery | Admitting: Neurological Surgery

## 2017-01-09 DIAGNOSIS — Z01818 Encounter for other preprocedural examination: Secondary | ICD-10-CM | POA: Diagnosis present

## 2017-01-09 DIAGNOSIS — M4310 Spondylolisthesis, site unspecified: Secondary | ICD-10-CM | POA: Diagnosis not present

## 2017-01-09 HISTORY — DX: Bipolar disorder, unspecified: F31.9

## 2017-01-09 HISTORY — DX: Cardiac arrhythmia, unspecified: I49.9

## 2017-01-09 LAB — SURGICAL PCR SCREEN
MRSA, PCR: NEGATIVE
STAPHYLOCOCCUS AUREUS: NEGATIVE

## 2017-01-09 LAB — BASIC METABOLIC PANEL
Anion gap: 10 (ref 5–15)
BUN: 10 mg/dL (ref 6–20)
CHLORIDE: 108 mmol/L (ref 101–111)
CO2: 20 mmol/L — AB (ref 22–32)
CREATININE: 0.77 mg/dL (ref 0.44–1.00)
Calcium: 9.3 mg/dL (ref 8.9–10.3)
GFR calc non Af Amer: >60 mL/min (ref >60)
Glucose, Bld: 186 mg/dL — ABNORMAL HIGH (ref 65–99)
POTASSIUM: 4.2 mmol/L (ref 3.5–5.1)
Sodium: 138 mmol/L (ref 135–145)

## 2017-01-09 LAB — CBC WITH DIFFERENTIAL/PLATELET
Basophils Absolute: 0 10*3/uL (ref 0.0–0.1)
Basophils Relative: 0 %
EOS ABS: 0.3 10*3/uL (ref 0.0–0.7)
Eosinophils Relative: 3 %
HEMATOCRIT: 40.2 % (ref 36.0–46.0)
HEMOGLOBIN: 12.6 g/dL (ref 12.0–15.0)
LYMPHS ABS: 3.3 10*3/uL (ref 0.7–4.0)
LYMPHS PCT: 33 %
MCH: 26.1 pg (ref 26.0–34.0)
MCHC: 31.3 g/dL (ref 30.0–36.0)
MCV: 83.4 fL (ref 78.0–100.0)
MONOS PCT: 5 %
Monocytes Absolute: 0.5 10*3/uL (ref 0.1–1.0)
NEUTROS PCT: 59 %
Neutro Abs: 5.7 10*3/uL (ref 1.7–7.7)
Platelets: 235 10*3/uL (ref 150–400)
RBC: 4.82 MIL/uL (ref 3.87–5.11)
RDW: 15.2 % (ref 11.5–15.5)
WBC: 10 10*3/uL (ref 4.0–10.5)

## 2017-01-09 LAB — TYPE AND SCREEN
ABO/RH(D): B POS
Antibody Screen: NEGATIVE

## 2017-01-09 LAB — PROTIME-INR
INR: 0.92
PROTHROMBIN TIME: 12.4 s (ref 11.4–15.2)

## 2017-01-09 LAB — ABO/RH: ABO/RH(D): B POS

## 2017-01-09 NOTE — Progress Notes (Addendum)
Anesthesia Chart Review: Patient is a 51 year old female scheduled for L3-4, L4-5 PLIF on 01/16/17 by Dr. Marikay Alar.   History includes former smoker (07/07/14), HTN, HLD, afib, CAD s/p CABG X 4 07/2014 [facility]), Bipolar disorder, depression, hysterectomy, tonsillectomy. BMI is consistent with obesity (borderline morbid obesity). She did not report a history of diabetes, but when records received from her PCP an A1c was listed at 8.4.   - PCP is Dr. Dolan Amen with [facility], last visit 06/11/16 for HTN follow-up. A1c was added to labs due to "hyperglycemia" with A1c of 8.4. (I don't have documentation on whether this was addressed or not, but she was not on DM medications then and is not on any now.) - Cardiologist is Dr. Lollie Marrow with [facility]. He saw her on 11/27/16 and did not recommend any additional cardiac testing prior to lumbar surgery.   Meds include Xanax, ASA 81 mg (to hold 3 days prior to surgery), Lipitor, Coreg, Eliquis (to hold 3 days prior to surgery), losartan 100 mg daily, Coreg 6.25 mg BID. At one point she was on HCTZ and lisinopril, but per patient HCTZ was discontinued and lisinopril was changed to losartan due to cough. Her current medications of Coreg and losartan (and not lisinopril and HCTZ) is supported by Dr. Reginal Lutes' 06/11/16 note although patient is now on a higher dose of Coreg (which is supported by the medication list at Dr. Dyke Brackett office). Patient reports that Dr. Jethro Poling manages her HTN. She was instructed that despite cardiac clearance, her surgery could be cancelled if she presented with a significantly elevated BP on the day of surgery.  BP (!) 187/117   Pulse 96   Temp 36.9 C (Oral)   Resp 20   Ht 5\' 5"  (1.651 m)   Wt 238 lb 1.6 oz (108 kg)   SpO2 98%   BMI 39.62 kg/m  Patient arrived with BP 227/139, but 187/117 on recheck. Patient was asymptomatic and had not taken her antihypertensive  medications due to being in a rush to make it to [facility] from [facility] by 8:00 AM. She also had [employer]ite last night for her birthday. She denied illicit drugs such as cocaine. She has a BP cuff at home, and will go home from [facility] and take her medications. BP at her 11/27/16 cardiology visit was 134/80. (I followed up with her this afternoon and at 1:40 PM today her home BP was 138/91 with HR 89.)   EKG 01/09/17: NSR, ST/T wave abnormality, consider anterolateral ischemia. She had high lateral T wave abnormality on 02/05/16 tracing from [facility], but V3-6 is new or more pronounced (V5-6) on current tracing.   Echo 11/27/16 [facility]): Impression: 1. There is mild concentric LVH. 2. Trace MR. 3. Normal study; normal cardiac chamber sizes and function; normal valve anatomy and function; no pericardial effusion or intracardiac mass. No intracardiac shunts by 2-D and color flow imaging. Normal thoracic aorta and aortic arch.  Nuclear stress test 04/01/16 [facility]): Impressions: The resting ECG shows normal sinus rhythm. The resting and stress ECG shows normal ST segment; and no ventricular tachycardia, significant QRS prolongation or heart block. Both the rest and stress images are within normal limits showing a showing only a mild fixed apical attenuation artifact. No significant reversible ischemia or fixed scar. Gated left ventricular EF is low normal at 45%. Normal LV segmental wall motion.  CXR 01/09/17: IMPRESSION:  Diffuse airspace opacities bilaterally which may be of a chronic fibrotic nature. Given lack of previous imaging a short-term follow-up is recommended to assess for stability. This could be performed in 4-6 weeks. Alternatively a CT of the chest could be performed for better characterization of the opacities. ([facility]. Patient notified that future follow-up recommended, either CXR within the next few  weeks or a chest CT. She denied SOB, fever, cough. She is not on home O2. Will defer timing of follow-up to Dr. Yetta BarreJones or he can defer to Dr. Jethro PolingNorwood.)  Preoperative labs noted. Cr 0.77. Glucose 186. CBC WNL. Will add an A1c to labs drawn today.   Last cath report and [facility] Op [facility]. I notified Erie NoeVanessa at Dr. Yetta BarreJones' office of patient's BP, CXR results, and probable untreated DM2. Hopefully lab can add the A1c. If her A1c is elevated and can't be added on then she will likely need to return for PCP for further management. Erie NoeVanessa will discuss this with Dr. Yetta BarreJones and get his recommendations for CXR follow-up.   Chart will be left for follow-up for records from [facility] and A1c results. Additional recommendations at that time.   Velna Ochsllison Zelenak, PA-C [facility]esthesiology Phone [phone] 01/09/2017 4:59 PM  Addendum:   Additional records received.   Cardiac [place] 07/18/14 ([place] [facility]):  1. Multivessel CAD. Active CX lesions, ostial ramus, RCA CTO, complex aneurysmal LAD lesion. 2. Preserved LV function. 3. Consult for possible CABG.  HbA1c at PAT is 8.6.   Pt went for f/u to PCP's office 01/10/17 and was evaluated by Laurence Alyyan Miller, PA for new DM.  Started on metformin 500mg  po BID.    If pt's glucose is acceptable DOS, I anticipate pt can proceed as scheduled.   Rica Mastngela Kamilia Carollo, FNP-BC [facility]iology Phone: [phone] 01/13/2017 3:29 PM

## 2017-01-09 NOTE — Progress Notes (Addendum)
PCP is Dr. Dolan Amenorothy [place] Cardiologist Dr Lollie Marrowaymond  [facility] Denies any chest pain, fever, or cough.  Echo noted from 02-2016 Stress test noted from 03-2016  Cardiac Clearance noted in the chart. Bp Today 227/139 states he was rushing and did not take her bp meds Rechecked bp 187/117 Revonda Standardllison called and informed of Bp  Instructed her to take Bp when she gets home and after her Bp med then call us with her readings. Also instructed her if Bp continues to read high to call her Dr or go to the Ed. Voices understanding. Pt states she was told to stop Eliquis and aspirin 3 days before surgery.

## 2017-01-10 LAB — HEMOGLOBIN A1C
Hgb A1c MFr Bld: 8.6 % — ABNORMAL HIGH (ref 4.8–5.6)
Mean Plasma Glucose: 200 mg/dL

## 2017-01-14 NOTE — Progress Notes (Addendum)
Ms Rachael Becker called message left for pt not not to take metformin on the day of surgery. Pt did call back and was informed not to take metformin the day of surgery.

## 2017-01-16 ENCOUNTER — Inpatient Hospital Stay (HOSPITAL_COMMUNITY): Payer: BLUE CROSS/BLUE SHIELD | Admitting: Vascular Surgery

## 2017-01-16 ENCOUNTER — Inpatient Hospital Stay (HOSPITAL_COMMUNITY)
Admission: RE | Admit: 2017-01-16 | Discharge: 2017-01-20 | DRG: 455 | Disposition: A | Discharge status: Home Health | Payer: BLUE CROSS/BLUE SHIELD | Attending: Neurological Surgery | Admitting: Neurological Surgery

## 2017-01-16 ENCOUNTER — Inpatient Hospital Stay (HOSPITAL_COMMUNITY): Payer: BLUE CROSS/BLUE SHIELD

## 2017-01-16 ENCOUNTER — Encounter (HOSPITAL_COMMUNITY)
Admission: RE | Disposition: A | Discharge status: Home Health | Payer: Self-pay | Source: Home / Self Care | Attending: Neurological Surgery

## 2017-01-16 ENCOUNTER — Encounter (HOSPITAL_COMMUNITY): Payer: Self-pay | Admitting: *Deleted

## 2017-01-16 DIAGNOSIS — M545 Low back pain: Secondary | ICD-10-CM | POA: Diagnosis present

## 2017-01-16 DIAGNOSIS — Z7982 Long term (current) use of aspirin: Secondary | ICD-10-CM

## 2017-01-16 DIAGNOSIS — I4891 Unspecified atrial fibrillation: Secondary | ICD-10-CM | POA: Diagnosis present

## 2017-01-16 DIAGNOSIS — Z8249 Family history of ischemic heart disease and other diseases of the circulatory system: Secondary | ICD-10-CM

## 2017-01-16 DIAGNOSIS — M5116 Intervertebral disc disorders with radiculopathy, lumbar region: Secondary | ICD-10-CM | POA: Diagnosis present

## 2017-01-16 DIAGNOSIS — Z7901 Long term (current) use of anticoagulants: Secondary | ICD-10-CM

## 2017-01-16 DIAGNOSIS — Z818 Family history of other mental and behavioral disorders: Secondary | ICD-10-CM

## 2017-01-16 DIAGNOSIS — R9389 Abnormal findings on diagnostic imaging of other specified body structures: Secondary | ICD-10-CM

## 2017-01-16 DIAGNOSIS — Z79891 Long term (current) use of opiate analgesic: Secondary | ICD-10-CM | POA: Diagnosis not present

## 2017-01-16 DIAGNOSIS — Z79899 Other long term (current) drug therapy: Secondary | ICD-10-CM

## 2017-01-16 DIAGNOSIS — Z886 Allergy status to analgesic agent status: Secondary | ICD-10-CM

## 2017-01-16 DIAGNOSIS — Z7984 Long term (current) use of oral hypoglycemic drugs: Secondary | ICD-10-CM

## 2017-01-16 DIAGNOSIS — Z888 Allergy status to other drugs, medicaments and biological substances status: Secondary | ICD-10-CM

## 2017-01-16 DIAGNOSIS — E785 Hyperlipidemia, unspecified: Secondary | ICD-10-CM | POA: Diagnosis present

## 2017-01-16 DIAGNOSIS — Z8619 Personal history of other infectious and parasitic diseases: Secondary | ICD-10-CM

## 2017-01-16 DIAGNOSIS — Z951 Presence of aortocoronary bypass graft: Secondary | ICD-10-CM

## 2017-01-16 DIAGNOSIS — Z981 Arthrodesis status: Secondary | ICD-10-CM

## 2017-01-16 DIAGNOSIS — F319 Bipolar disorder, unspecified: Secondary | ICD-10-CM | POA: Diagnosis present

## 2017-01-16 DIAGNOSIS — M48061 Spinal stenosis, lumbar region without neurogenic claudication: Secondary | ICD-10-CM | POA: Diagnosis present

## 2017-01-16 DIAGNOSIS — Z87891 Personal history of nicotine dependence: Secondary | ICD-10-CM

## 2017-01-16 DIAGNOSIS — I10 Essential (primary) hypertension: Secondary | ICD-10-CM | POA: Diagnosis present

## 2017-01-16 DIAGNOSIS — Z90710 Acquired absence of both cervix and uterus: Secondary | ICD-10-CM | POA: Diagnosis not present

## 2017-01-16 DIAGNOSIS — E119 Type 2 diabetes mellitus without complications: Secondary | ICD-10-CM | POA: Diagnosis present

## 2017-01-16 DIAGNOSIS — Z419 Encounter for procedure for purposes other than remedying health state, unspecified: Secondary | ICD-10-CM

## 2017-01-16 HISTORY — DX: Type 2 diabetes mellitus without complications: E11.9

## 2017-01-16 LAB — GLUCOSE, CAPILLARY
GLUCOSE-CAPILLARY: 233 mg/dL — AB (ref 65–99)
Glucose-Capillary: 177 mg/dL — ABNORMAL HIGH (ref 65–99)
Glucose-Capillary: 319 mg/dL — ABNORMAL HIGH (ref 65–99)

## 2017-01-16 SURGERY — POSTERIOR LUMBAR FUSION 2 LEVEL
Anesthesia: General | Site: Back

## 2017-01-16 MED ORDER — ACETAMINOPHEN 650 MG RE SUPP: 650.0000 mg | RECTAL | Status: DC | PRN

## 2017-01-16 MED ORDER — CHLORHEXIDINE GLUCONATE CLOTH 2 % EX PADS: 6.0000 | MEDICATED_PAD | Freq: Once | CUTANEOUS | Status: DC

## 2017-01-16 MED ORDER — ROCURONIUM BROMIDE 50 MG/5ML IV SOLN
INTRAVENOUS | Status: AC
  Filled 2017-01-16: qty 1

## 2017-01-16 MED ORDER — MENTHOL 3 MG MT LOZG: 1.0000 | LOZENGE | OROMUCOSAL | Status: DC | PRN

## 2017-01-16 MED ORDER — CARVEDILOL 6.25 MG PO TABS
6.2500 mg | ORAL_TABLET | Freq: Two times a day (BID) | ORAL | Status: DC
  Administered 2017-01-17 – 2017-01-18 (×3): 6.25 mg via ORAL
  Filled 2017-01-16 (×3): qty 1

## 2017-01-16 MED ORDER — DEXAMETHASONE SODIUM PHOSPHATE 10 MG/ML IJ SOLN
INTRAMUSCULAR | Status: AC
  Filled 2017-01-16: qty 1

## 2017-01-16 MED ORDER — SUGAMMADEX SODIUM 500 MG/5ML IV SOLN
INTRAVENOUS | Status: AC
  Filled 2017-01-16: qty 5

## 2017-01-16 MED ORDER — PANTOPRAZOLE SODIUM 40 MG PO TBEC
40.0000 mg | DELAYED_RELEASE_TABLET | Freq: Every day | ORAL | Status: DC
  Administered 2017-01-17 – 2017-01-20 (×4): 40 mg via ORAL
  Filled 2017-01-16 (×4): qty 1

## 2017-01-16 MED ORDER — HYDROMORPHONE HCL 1 MG/ML IJ SOLN
INTRAMUSCULAR | Status: AC
  Filled 2017-01-16: qty 0.5

## 2017-01-16 MED ORDER — SENNA 8.6 MG PO TABS
1.0000 | ORAL_TABLET | Freq: Two times a day (BID) | ORAL | Status: DC
  Administered 2017-01-17 – 2017-01-20 (×8): 8.6 mg via ORAL
  Filled 2017-01-16 (×8): qty 1

## 2017-01-16 MED ORDER — OXYCODONE HCL 5 MG PO TABS
5.0000 mg | ORAL_TABLET | ORAL | Status: DC | PRN
  Administered 2017-01-16 – 2017-01-20 (×15): 10 mg via ORAL
  Filled 2017-01-16 (×14): qty 2

## 2017-01-16 MED ORDER — SODIUM CHLORIDE 0.9% FLUSH: 3.0000 mL | INTRAVENOUS | Status: DC | PRN

## 2017-01-16 MED ORDER — VENLAFAXINE HCL ER 75 MG PO CP24
75.0000 mg | ORAL_CAPSULE | Freq: Every day | ORAL | Status: DC
Start: 2017-01-17 — End: 2017-01-20
  Administered 2017-01-17 – 2017-01-20 (×4): 75 mg via ORAL
  Filled 2017-01-16 (×4): qty 1

## 2017-01-16 MED ORDER — LABETALOL HCL 5 MG/ML IV SOLN
INTRAVENOUS | Status: AC
  Filled 2017-01-16: qty 4

## 2017-01-16 MED ORDER — PROPOFOL 10 MG/ML IV BOLUS
INTRAVENOUS | Status: AC
  Filled 2017-01-16: qty 20

## 2017-01-16 MED ORDER — VANCOMYCIN HCL 1000 MG IV SOLR
INTRAVENOUS | Status: DC | PRN
  Administered 2017-01-16: 1000 mg via TOPICAL

## 2017-01-16 MED ORDER — BUPIVACAINE HCL (PF) 0.25 % IJ SOLN
INTRAMUSCULAR | Status: AC
  Filled 2017-01-16: qty 30

## 2017-01-16 MED ORDER — LACTATED RINGERS IV SOLN
INTRAVENOUS | Status: DC
  Administered 2017-01-16 (×3): via INTRAVENOUS

## 2017-01-16 MED ORDER — SODIUM CHLORIDE 0.9 % IR SOLN
Status: DC | PRN
  Administered 2017-01-16: 12:00:00

## 2017-01-16 MED ORDER — ONDANSETRON HCL 4 MG/2ML IJ SOLN: 4.0000 mg | Freq: Once | INTRAMUSCULAR | Status: DC | PRN

## 2017-01-16 MED ORDER — PHENOL 1.4 % MT LIQD: 1.0000 | OROMUCOSAL | Status: DC | PRN

## 2017-01-16 MED ORDER — SUGAMMADEX SODIUM 500 MG/5ML IV SOLN
INTRAVENOUS | Status: DC | PRN
  Administered 2017-01-16: 300 mg via INTRAVENOUS

## 2017-01-16 MED ORDER — FENTANYL CITRATE (PF) 250 MCG/5ML IJ SOLN
INTRAMUSCULAR | Status: AC
  Filled 2017-01-16: qty 5

## 2017-01-16 MED ORDER — THROMBIN 5000 UNITS EX SOLR
OROMUCOSAL | Status: DC | PRN
  Administered 2017-01-16: 12:00:00 via TOPICAL

## 2017-01-16 MED ORDER — INSULIN ASPART 100 UNIT/ML ~~LOC~~ SOLN
0.0000 [IU] | Freq: Three times a day (TID) | SUBCUTANEOUS | Status: DC
Start: 2017-01-17 — End: 2017-01-20
  Administered 2017-01-16: 11 [IU] via SUBCUTANEOUS
  Administered 2017-01-17: 3 [IU] via SUBCUTANEOUS
  Administered 2017-01-17: 5 [IU] via SUBCUTANEOUS
  Administered 2017-01-18: 3 [IU] via SUBCUTANEOUS
  Administered 2017-01-18: 5 [IU] via SUBCUTANEOUS
  Administered 2017-01-18 – 2017-01-20 (×4): 3 [IU] via SUBCUTANEOUS
  Administered 2017-01-20 (×2): 2 [IU] via SUBCUTANEOUS

## 2017-01-16 MED ORDER — THROMBIN 20000 UNITS EX SOLR
CUTANEOUS | Status: DC | PRN
  Administered 2017-01-16: 12:00:00 via TOPICAL

## 2017-01-16 MED ORDER — METHOCARBAMOL 500 MG PO TABS
500.0000 mg | ORAL_TABLET | Freq: Four times a day (QID) | ORAL | Status: DC | PRN
  Administered 2017-01-16 – 2017-01-20 (×5): 500 mg via ORAL
  Filled 2017-01-16 (×4): qty 1

## 2017-01-16 MED ORDER — ACETAMINOPHEN 10 MG/ML IV SOLN
INTRAVENOUS | Status: AC
  Filled 2017-01-16: qty 100

## 2017-01-16 MED ORDER — ACETAMINOPHEN 10 MG/ML IV SOLN
INTRAVENOUS | Status: DC | PRN
  Administered 2017-01-16: 1000 mg via INTRAVENOUS

## 2017-01-16 MED ORDER — METHOCARBAMOL 1000 MG/10ML IJ SOLN
500.0000 mg | Freq: Four times a day (QID) | INTRAVENOUS | Status: DC | PRN
  Filled 2017-01-16: qty 5

## 2017-01-16 MED ORDER — VANCOMYCIN HCL 1000 MG IV SOLR
INTRAVENOUS | Status: AC
  Filled 2017-01-16: qty 1000

## 2017-01-16 MED ORDER — LIDOCAINE HCL (CARDIAC) 20 MG/ML IV SOLN
INTRAVENOUS | Status: DC | PRN
  Administered 2017-01-16: 100 mg via INTRAVENOUS

## 2017-01-16 MED ORDER — HYDROMORPHONE HCL 1 MG/ML IJ SOLN
0.2500 mg | INTRAMUSCULAR | Status: DC | PRN
  Administered 2017-01-16: 0.5 mg via INTRAVENOUS

## 2017-01-16 MED ORDER — ROCURONIUM BROMIDE 100 MG/10ML IV SOLN
INTRAVENOUS | Status: DC | PRN
  Administered 2017-01-16: 50 mg via INTRAVENOUS
  Administered 2017-01-16: 15 mg via INTRAVENOUS
  Administered 2017-01-16: 20 mg via INTRAVENOUS
  Administered 2017-01-16: 15 mg via INTRAVENOUS

## 2017-01-16 MED ORDER — ASPIRIN EC 81 MG PO TBEC
81.0000 mg | DELAYED_RELEASE_TABLET | Freq: Every day | ORAL | Status: DC
  Administered 2017-01-16 – 2017-01-20 (×5): 81 mg via ORAL
  Filled 2017-01-16 (×5): qty 1

## 2017-01-16 MED ORDER — CELECOXIB 200 MG PO CAPS
200.0000 mg | ORAL_CAPSULE | Freq: Two times a day (BID) | ORAL | Status: DC
  Administered 2017-01-17 – 2017-01-20 (×8): 200 mg via ORAL
  Filled 2017-01-16 (×8): qty 1

## 2017-01-16 MED ORDER — LABETALOL HCL 5 MG/ML IV SOLN
INTRAVENOUS | Status: DC | PRN
  Administered 2017-01-16 (×2): 5 mg via INTRAVENOUS
  Administered 2017-01-16: 10 mg via INTRAVENOUS

## 2017-01-16 MED ORDER — MORPHINE SULFATE (PF) 2 MG/ML IV SOLN
2.0000 mg | INTRAVENOUS | Status: DC | PRN
  Administered 2017-01-16 – 2017-01-17 (×3): 2 mg via INTRAVENOUS
  Filled 2017-01-16 (×3): qty 1

## 2017-01-16 MED ORDER — DEXAMETHASONE SODIUM PHOSPHATE 10 MG/ML IJ SOLN
10.0000 mg | INTRAMUSCULAR | Status: AC
  Administered 2017-01-16: 5 mg via INTRAVENOUS

## 2017-01-16 MED ORDER — PROPOFOL 10 MG/ML IV BOLUS
INTRAVENOUS | Status: DC | PRN
  Administered 2017-01-16: 40 mg via INTRAVENOUS
  Administered 2017-01-16: 110 mg via INTRAVENOUS

## 2017-01-16 MED ORDER — LOSARTAN POTASSIUM 50 MG PO TABS
100.0000 mg | ORAL_TABLET | Freq: Every day | ORAL | Status: DC
  Administered 2017-01-17 – 2017-01-20 (×3): 100 mg via ORAL
  Filled 2017-01-16 (×4): qty 2

## 2017-01-16 MED ORDER — MEPERIDINE HCL 25 MG/ML IJ SOLN: 6.2500 mg | INTRAMUSCULAR | Status: DC | PRN

## 2017-01-16 MED ORDER — ONDANSETRON HCL 4 MG PO TABS: 4.0000 mg | ORAL_TABLET | Freq: Four times a day (QID) | ORAL | Status: DC | PRN

## 2017-01-16 MED ORDER — OXYCODONE HCL 5 MG PO TABS
ORAL_TABLET | ORAL | Status: AC
  Filled 2017-01-16: qty 2

## 2017-01-16 MED ORDER — CEFAZOLIN SODIUM-DEXTROSE 2-4 GM/100ML-% IV SOLN
INTRAVENOUS | Status: AC
  Filled 2017-01-16: qty 100

## 2017-01-16 MED ORDER — LIDOCAINE HCL (CARDIAC) 20 MG/ML IV SOLN
INTRAVENOUS | Status: AC
  Filled 2017-01-16: qty 5

## 2017-01-16 MED ORDER — FENTANYL CITRATE (PF) 100 MCG/2ML IJ SOLN
INTRAMUSCULAR | Status: DC | PRN
  Administered 2017-01-16: 50 ug via INTRAVENOUS
  Administered 2017-01-16: 150 ug via INTRAVENOUS
  Administered 2017-01-16 (×2): 50 ug via INTRAVENOUS
  Administered 2017-01-16: 100 ug via INTRAVENOUS
  Administered 2017-01-16: 50 ug via INTRAVENOUS

## 2017-01-16 MED ORDER — BUPIVACAINE HCL (PF) 0.25 % IJ SOLN
INTRAMUSCULAR | Status: DC | PRN
  Administered 2017-01-16: 3 mL

## 2017-01-16 MED ORDER — ONDANSETRON HCL 4 MG/2ML IJ SOLN
INTRAMUSCULAR | Status: AC
  Filled 2017-01-16: qty 2

## 2017-01-16 MED ORDER — CEFAZOLIN SODIUM-DEXTROSE 2-4 GM/100ML-% IV SOLN
2.0000 g | Freq: Three times a day (TID) | INTRAVENOUS | Status: AC
  Administered 2017-01-16 – 2017-01-17 (×2): 2 g via INTRAVENOUS
  Filled 2017-01-16 (×2): qty 100

## 2017-01-16 MED ORDER — PHENYLEPHRINE 40 MCG/ML (10ML) SYRINGE FOR IV PUSH (FOR BLOOD PRESSURE SUPPORT)
PREFILLED_SYRINGE | INTRAVENOUS | Status: AC
  Filled 2017-01-16: qty 10

## 2017-01-16 MED ORDER — QUETIAPINE FUMARATE 200 MG PO TABS
200.0000 mg | ORAL_TABLET | Freq: Every day | ORAL | Status: DC
  Administered 2017-01-16 – 2017-01-19 (×4): 200 mg via ORAL
  Filled 2017-01-16 (×4): qty 1

## 2017-01-16 MED ORDER — ONDANSETRON HCL 4 MG/2ML IJ SOLN: 4.0000 mg | Freq: Four times a day (QID) | INTRAMUSCULAR | Status: DC | PRN

## 2017-01-16 MED ORDER — THROMBIN 5000 UNITS EX SOLR
CUTANEOUS | Status: AC
  Filled 2017-01-16: qty 5000

## 2017-01-16 MED ORDER — THROMBIN 20000 UNITS EX SOLR
CUTANEOUS | Status: AC
  Filled 2017-01-16: qty 20000

## 2017-01-16 MED ORDER — METFORMIN HCL 500 MG PO TABS
500.0000 mg | ORAL_TABLET | Freq: Two times a day (BID) | ORAL | Status: DC
  Administered 2017-01-17 – 2017-01-20 (×8): 500 mg via ORAL
  Filled 2017-01-16 (×8): qty 1

## 2017-01-16 MED ORDER — MIDAZOLAM HCL 5 MG/5ML IJ SOLN
INTRAMUSCULAR | Status: DC | PRN
  Administered 2017-01-16: 2 mg via INTRAVENOUS

## 2017-01-16 MED ORDER — 0.9 % SODIUM CHLORIDE (POUR BTL) OPTIME
TOPICAL | Status: DC | PRN
  Administered 2017-01-16: 1000 mL

## 2017-01-16 MED ORDER — METHOCARBAMOL 500 MG PO TABS
ORAL_TABLET | ORAL | Status: AC
  Filled 2017-01-16: qty 1

## 2017-01-16 MED ORDER — SODIUM CHLORIDE 0.9 % IV SOLN
250.0000 mL | INTRAVENOUS | Status: DC
  Administered 2017-01-16: 250 mL via INTRAVENOUS

## 2017-01-16 MED ORDER — MIDAZOLAM HCL 2 MG/2ML IJ SOLN
INTRAMUSCULAR | Status: AC
  Filled 2017-01-16: qty 2

## 2017-01-16 MED ORDER — ACETAMINOPHEN 325 MG PO TABS: 650.0000 mg | ORAL_TABLET | ORAL | Status: DC | PRN

## 2017-01-16 MED ORDER — POTASSIUM CHLORIDE IN NACL 20-0.9 MEQ/L-% IV SOLN
INTRAVENOUS | Status: DC
  Filled 2017-01-16: qty 1000

## 2017-01-16 MED ORDER — ONDANSETRON HCL 4 MG/2ML IJ SOLN
INTRAMUSCULAR | Status: DC | PRN
  Administered 2017-01-16: 4 mg via INTRAVENOUS

## 2017-01-16 MED ORDER — SODIUM CHLORIDE 0.9% FLUSH
3.0000 mL | Freq: Two times a day (BID) | INTRAVENOUS | Status: DC
  Administered 2017-01-17 – 2017-01-19 (×3): 3 mL via INTRAVENOUS

## 2017-01-16 MED ORDER — CEFAZOLIN SODIUM-DEXTROSE 2-4 GM/100ML-% IV SOLN
2.0000 g | INTRAVENOUS | Status: AC
  Administered 2017-01-16: 2 g via INTRAVENOUS

## 2017-01-16 MED ORDER — ASPIRIN 81 MG PO TABS: 81.0000 mg | ORAL_TABLET | Freq: Every evening | ORAL | Status: DC

## 2017-01-16 SURGICAL SUPPLY — 64 items
BAG DECANTER FOR FLEXI CONT (MISCELLANEOUS) ×2 IMPLANT
BASKET BONE COLLECTION (BASKET) ×2 IMPLANT
BENZOIN TINCTURE PRP APPL 2/3 (GAUZE/BANDAGES/DRESSINGS) ×2 IMPLANT
BLADE CLIPPER SURG (BLADE) IMPLANT
BUR MATCHSTICK NEURO 3.0 LAGG (BURR) ×2 IMPLANT
CANISTER SUCT 3000ML PPV (MISCELLANEOUS) ×2 IMPLANT
CARTRIDGE OIL MAESTRO DRILL (MISCELLANEOUS) ×1 IMPLANT
CONT SPEC 4OZ CLIKSEAL STRL BL (MISCELLANEOUS) ×2 IMPLANT
COVER BACK TABLE 60X90IN (DRAPES) ×2 IMPLANT
DERMABOND ADVANCED (GAUZE/BANDAGES/DRESSINGS) ×1
DERMABOND ADVANCED .7 DNX12 (GAUZE/BANDAGES/DRESSINGS) ×1 IMPLANT
DIFFUSER DRILL AIR PNEUMATIC (MISCELLANEOUS) ×2 IMPLANT
DRAPE C-ARM 42X72 X-RAY (DRAPES) ×2 IMPLANT
DRAPE C-ARMOR (DRAPES) ×2 IMPLANT
DRAPE LAPAROTOMY 100X72X124 (DRAPES) ×2 IMPLANT
DRAPE POUCH INSTRU U-SHP 10X18 (DRAPES) ×2 IMPLANT
DRAPE SURG 17X23 STRL (DRAPES) ×2 IMPLANT
DRSG OPSITE POSTOP 4X6 (GAUZE/BANDAGES/DRESSINGS) ×2 IMPLANT
DURAPREP 26ML APPLICATOR (WOUND CARE) ×2 IMPLANT
ELECT REM PT RETURN 9FT ADLT (ELECTROSURGICAL) ×2
ELECTRODE REM PT RTRN 9FT ADLT (ELECTROSURGICAL) ×1 IMPLANT
EVACUATOR 1/8 PVC DRAIN (DRAIN) IMPLANT
GAUZE SPONGE 4X4 16PLY XRAY LF (GAUZE/BANDAGES/DRESSINGS) IMPLANT
GLOVE BIO SURGEON STRL SZ7 (GLOVE) ×2 IMPLANT
GLOVE BIO SURGEON STRL SZ8 (GLOVE) ×2 IMPLANT
GLOVE BIOGEL PI IND STRL 7.0 (GLOVE) ×1 IMPLANT
GLOVE BIOGEL PI IND STRL 7.5 (GLOVE) ×3 IMPLANT
GLOVE BIOGEL PI INDICATOR 7.0 (GLOVE) ×1
GLOVE BIOGEL PI INDICATOR 7.5 (GLOVE) ×3
GLOVE ECLIPSE 7.0 STRL STRAW (GLOVE) ×2 IMPLANT
GLOVE SURG SS PI 7.0 STRL IVOR (GLOVE) ×10 IMPLANT
GOWN STRL REUS W/ TWL LRG LVL3 (GOWN DISPOSABLE) ×5 IMPLANT
GOWN STRL REUS W/ TWL XL LVL3 (GOWN DISPOSABLE) ×1 IMPLANT
GOWN STRL REUS W/TWL 2XL LVL3 (GOWN DISPOSABLE) IMPLANT
GOWN STRL REUS W/TWL LRG LVL3 (GOWN DISPOSABLE) ×5
GOWN STRL REUS W/TWL XL LVL3 (GOWN DISPOSABLE) ×1
HEMOSTAT POWDER KIT SURGIFOAM (HEMOSTASIS) ×2 IMPLANT
KIT BASIN OR (CUSTOM PROCEDURE TRAY) ×2 IMPLANT
KIT ROOM TURNOVER OR (KITS) ×2 IMPLANT
MILL MEDIUM DISP (BLADE) ×2 IMPLANT
NEEDLE HYPO 25X1 1.5 SAFETY (NEEDLE) ×2 IMPLANT
NS IRRIG 1000ML POUR BTL (IV SOLUTION) ×2 IMPLANT
OIL CARTRIDGE MAESTRO DRILL (MISCELLANEOUS) ×2
PACK LAMINECTOMY NEURO (CUSTOM PROCEDURE TRAY) ×2 IMPLANT
PAD ARMBOARD 7.5X6 YLW CONV (MISCELLANEOUS) ×6 IMPLANT
ROD PC 5.5X65 TI ARSENAL (Rod) ×4 IMPLANT
SCREW CBX 5.5X35MM ARSENAL (Screw) ×8 IMPLANT
SCREW CORT CANC 5.5X40 (Screw) ×4 IMPLANT
SCREW SET SPINAL ARSENAL 47127 (Screw) ×12 IMPLANT
SPACER BATTALION 5 .25X10MM 9 (Spacer) ×4 IMPLANT
SPACER PEEK PS 25X9MM 9MM 5DEG (Spacer) ×4 IMPLANT
SPONGE LAP 4X18 X RAY DECT (DISPOSABLE) IMPLANT
SPONGE SURGIFOAM ABS GEL 100 (HEMOSTASIS) ×2 IMPLANT
STRIP BIOACTIVE 20CC 25X100X8 (Miscellaneous) ×2 IMPLANT
STRIP CLOSURE SKIN 1/2X4 (GAUZE/BANDAGES/DRESSINGS) ×2 IMPLANT
SUT VIC AB 0 CT1 18XCR BRD8 (SUTURE) ×1 IMPLANT
SUT VIC AB 0 CT1 8-18 (SUTURE) ×1
SUT VIC AB 2-0 CP2 18 (SUTURE) ×2 IMPLANT
SUT VIC AB 3-0 SH 8-18 (SUTURE) ×4 IMPLANT
SYR CONTROL 10ML LL (SYRINGE) ×2 IMPLANT
TOWEL GREEN STERILE (TOWEL DISPOSABLE) ×2 IMPLANT
TOWEL GREEN STERILE FF (TOWEL DISPOSABLE) ×2 IMPLANT
TRAY FOLEY W/METER SILVER 16FR (SET/KITS/TRAYS/PACK) ×2 IMPLANT
WATER STERILE IRR 1000ML POUR (IV SOLUTION) ×2 IMPLANT

## 2017-01-16 NOTE — Op Note (Signed)
01/16/2017  2:46 PM  PATIENT:  Rachael Becker  51 y.o. 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 Decompressive lumbar laminectomy L3-4 and L4-5 requiring more work than would be required for a simple exposure of the disk for PLIF in order to adequately decompress the neural elements and address the spinal stenosis 2. Posterior lumbar interbody fusion L3-4 and L4-5 using PEEK interbody cages packed with morcellized allograft and autograft 3. Posterior fixation L3-L5 inclusive using Alphatec cortical pedicle screws.  4. Intertransverse arthrodesis L3-L5 using morcellized autograft and allograft.  SURGEON:  Marikay Alar, MD  ASSISTANTS: Dr. Conchita Paris  ANESTHESIA:  General  EBL: 150 ml  Total I/O In: 1600 [I.V.:1600] Out: 500 [Urine:350; Blood:150]  BLOOD ADMINISTERED:none  DRAINS: none   INDICATION FOR PROCEDURE: This patient presented with severe back and leg pain. Imaging revealed recurrent disc herniation L4-5 and dynamic instability L3-4 and L4-5. The patient tried a reasonable attempt at conservative medical measures without relief. I recommended decompression and instrumented fusion to address the stenosis as well as the segmental stability.  Patient understood the risks, benefits, and alternatives and potential outcomes and wished to proceed.  PROCEDURE DETAILS:  The patient was brought to the operating room. After induction of generalized endotracheal anesthesia the patient was rolled into the prone position on chest rolls and all pressure points were padded. The patient's lumbar region was cleaned and then prepped with DuraPrep and draped in the usual sterile fashion. Anesthesia was injected and then a dorsal midline incision was made and carried down to the lumbosacral fascia. The fascia was opened and the paraspinous musculature  was taken down in a subperiosteal fashion to expose L3-4 and L4-5. A self-retaining retractor was placed. Intraoperative fluoroscopy confirmed my level, and I started with placement of the L3 cortical pedicle screws. The pedicle screw entry zones were identified utilizing surface landmarks and  AP and lateral fluoroscopy. I scored the cortex with the high-speed drill and then used the hand drill  to drill an upward and outward direction into the pedicle. I then tapped line to line, and the tap was also monitored. I then placed a 5.5 x 40 mm cortical pedicle screw into the pedicles of L3 bilaterally. I then turned my attention to the decompression and complete lumbar laminectomies, hemi- facetectomies, and foraminotomies were performed at L3-4 and L4-5. The patient had significant spinal stenosis and this required more work than would be required for a simple exposure of the disc for posterior lumbar interbody fusion. Much more generous decompression was undertaken in order to adequately decompress the neural elements and address the patient's leg pain. The yellow ligament was removed to expose the underlying dura and nerve roots, and generous foraminotomies were performed to adequately decompress the neural elements. Both the exiting and traversing nerve roots were decompressed on both sides until a coronary dilator passed easily along the nerve roots. Once the decompression was complete, I turned my attention to the posterior lower lumbar interbody fusion. The epidural venous vasculature was coagulated and cut sharply. Disc space was incised and the initial discectomy was performed with pituitary rongeurs. The disc space was distracted with sequential distractors to a height of 10 mm at L3-4 and 9 mm at L4-5. We then used a series of scrapers and shavers to prepare the endplates for fusion. The midline was prepared with Epstein curettes. Once the  complete discectomy was finished, we packed an appropriate sized peek  interbody cage with local autograft and morcellized allograft, gently retracted the nerve root, and tapped the cage into position at L3-4 and L4-5.  The midline between the cages was packed with morselized autograft and allograft. We then turned our attention to the placement of the lower pedicle screws. The pedicle screw entry zones were identified utilizing surface landmarks and fluoroscopy. I drilled into each pedicle utilizing the hand drill, and tapped each pedicle with the appropriate tap. We palpated with a ball probe to assure no break in the cortex. We then placed 5.5 x 35 mm cortical pedicle screws into the pedicles bilaterally at L4 and L5. We then decorticated the transverse processes and laid a mixture of morcellized autograft and allograft out over these to perform intertransverse arthrodesis at L3-4 and L4-5. We then placed lordotic rods into the multiaxial screw heads of the pedicle screws and locked these in position with the locking caps and anti-torque device. We then checked our construct with AP and lateral fluoroscopy. Irrigated with copious amounts of bacitracin-containing saline solution. Inspected the nerve roots once again to assure adequate decompression, lined to the dura with Gelfoam, and closed the muscle and the fascia with 0 Vicryl. Closed the subcutaneous tissues with 2-0 Vicryl and subcuticular tissues with 3-0 Vicryl. The skin was closed with benzoin and Steri-Strips. Dressing was then applied, the patient was awakened from general anesthesia and transported to the recovery room in stable condition. At the end of the procedure all sponge, needle and instrument counts were correct.   PLAN OF CARE: admit to inpatient  PATIENT DISPOSITION:  PACU - hemodynamically stable.   Delay start of Pharmacological VTE agent (>24hrs) due to surgical blood loss or risk of bleeding:  yes

## 2017-01-16 NOTE — Anesthesia Postprocedure Evaluation (Signed)
Anesthesia Post Note  Patient: Rachael SnipesVenetia Becker  Procedure(s) Performed: Procedure(s) (LRB): Posterior Lumbar Interbody Fusion Lumbar three-four, Lumbar four-five (N/A)     Patient location during evaluation: PACU Anesthesia Type: General Level of consciousness: awake and alert Pain management: pain level controlled Vital Signs Assessment: post-procedure vital signs reviewed and stable Respiratory status: spontaneous breathing, nonlabored ventilation, respiratory function stable and patient connected to nasal cannula oxygen Cardiovascular status: blood pressure returned to baseline and stable Postop Assessment: no signs of nausea or vomiting Anesthetic complications: no    Last Vitals:  Vitals:   01/16/17 1630 01/16/17 1704  BP: (!) 179/91 (!) 162/88  Pulse: 77 79  Resp: 12 14  Temp:      Last Pain:  Vitals:   01/16/17 1745  TempSrc:   PainSc: Asleep                 Mazelle Huebert DAVID

## 2017-01-16 NOTE — Anesthesia Procedure Notes (Signed)
Procedure Name: Intubation Date/Time: 01/16/2017 11:13 AM Performed by: Lillia Abed Pre-anesthesia Checklist: Patient identified, Emergency Drugs available, Suction available and Patient being monitored Patient Re-evaluated:Patient Re-evaluated prior to induction Oxygen Delivery Method: Circle System Utilized Preoxygenation: Pre-oxygenation with 100% oxygen Induction Type: IV induction Ventilation: Mask ventilation without difficulty Laryngoscope Size: Mac and 3 Grade View: Grade I Tube type: Oral Tube size: 7.0 mm Number of attempts: 1 Airway Equipment and Method: Stylet and Oral airway Placement Confirmation: ETT inserted through vocal cords under direct vision,  positive ETCO2 and breath sounds checked- equal and bilateral Secured at: 21 cm Tube secured with: Tape Dental Injury: Teeth and Oropharynx as per pre-operative assessment

## 2017-01-16 NOTE — Anesthesia Preprocedure Evaluation (Signed)
Anesthesia Evaluation  Patient identified by MRN, date of birth, ID band Patient awake    Reviewed: Allergy & Precautions, NPO status , Patient's Chart, lab work & pertinent test results  Airway Mallampati: II  TM Distance: >3 FB     Dental   Pulmonary former smoker,    Pulmonary exam normal        Cardiovascular hypertension, Pt. on medications + CABG  Normal cardiovascular exam+ dysrhythmias Atrial Fibrillation      Neuro/Psych Depression Bipolar Disorder    GI/Hepatic   Endo/Other  diabetes, Type 2, Oral Hypoglycemic Agents  Renal/GU      Musculoskeletal   Abdominal   Peds  Hematology   Anesthesia Other Findings   Reproductive/Obstetrics                             Anesthesia Physical Anesthesia Plan  ASA: III  Anesthesia Plan: General   Post-op Pain Management:    Induction:   PONV Risk Score and Plan: 3 and Ondansetron, Dexamethasone, Propofol and Midazolam  Airway Management Planned: Oral ETT  Additional Equipment:   Intra-op Plan:   Post-operative Plan: Extubation in OR  Informed Consent: I have reviewed the patients History and Physical, chart, labs and discussed the procedure including the risks, benefits and alternatives for the proposed anesthesia with the patient or authorized representative who has indicated his/her understanding and acceptance.     Plan Discussed with: CRNA and Surgeon  Anesthesia Plan Comments:         Anesthesia Quick Evaluation

## 2017-01-16 NOTE — H&P (Signed)
Subjective: Patient is a 51 y.o. female admitted for back and leg pain. Onset of symptoms was several years ago, gradually worsening since that time.  The pain is rated severe, unremitting, and is located at the across the lower back and radiates to legs. The pain is described as aching and occurs all day. The symptoms have been progressive. Symptoms are exacerbated by exercise. MRI or CT showed dynamic instability L3-4, L4-5   Past Medical History:  Diagnosis Date  . Bipolar disorder (HCC)   . Depression   . Diabetes mellitus without complication (HCC)   . Dysrhythmia    a fib  . Heart disease   . History of chicken pox   . Hyperlipidemia   . Hypertension     Past Surgical History:  Procedure Laterality Date  . ABDOMINAL HYSTERECTOMY  2001  . BYPASS GRAFT  01.12.2016  . MULTIPLE TOOTH EXTRACTIONS     [place]  . TONSILLECTOMY  1970  . WISDOM TOOTH EXTRACTION      Prior to Admission medications   Medication Sig Start Date End Date Taking? Authorizing Provider  ALPRAZolam Prudy Feeler) 1 MG tablet Take 1 tablet (1 mg total) by mouth 2 (two) times daily as needed for anxiety. Patient taking differently: Take 1 mg by mouth 3 (three) times daily as needed for anxiety.  09/19/14  Yes Waldon Merl, PA-C  APPLE CIDER VINEGAR PO Take 30 mg by mouth daily.   Yes [provider]  aspirin 81 MG tablet Take 81 mg by mouth every evening.    Yes [provider]  Aspirin-Acetaminophen (GOODY BODY PAIN) 500-325 MG PACK Take 1 packet by mouth daily as needed (pain).   Yes [provider]  atorvastatin (LIPITOR) 80 MG tablet Take 0.5 tablets (40 mg total) by mouth daily. 08/28/14  Yes Waldon Merl, PA-C  carvedilol (COREG) 6.25 MG tablet Take 6.25 mg by mouth 2 (two) times daily. 11/12/16  Yes [provider]  ELIQUIS 5 MG TABS tablet Take 5 mg by mouth 2 (two) times daily. 10/31/16  Yes [provider]  losartan (COZAAR) 100 MG tablet Take 100 mg by  mouth daily. 11/05/16  Yes [provider]  metFORMIN (GLUCOPHAGE) 500 MG tablet Take 500 mg by mouth 2 (two) times daily with a meal.   Yes [provider]  omeprazole (PRILOSEC) 20 MG capsule Take 20 mg by mouth daily.   Yes [provider]  oxyCODONE-acetaminophen (PERCOCET) 7.5-325 MG tablet Take 1 tablet by mouth 3 (three) times daily. 12/11/16  Yes [provider]  QUEtiapine (SEROQUEL) 100 MG tablet Take 200 mg by mouth at bedtime. 11/21/16  Yes [provider]  venlafaxine XR (EFFEXOR-XR) 75 MG 24 hr capsule Take 1 capsule (75 mg total) by mouth daily with breakfast. Patient taking differently: Take 75 mg by mouth 3 (three) times daily.  10/03/14  Yes Waldon Merl, PA-C  hydrochlorothiazide (HYDRODIURIL) 25 MG tablet Take 1 tablet (25 mg total) by mouth daily. Patient not taking: Reported on 01/05/2017 08/28/14   Waldon Merl, PA-C  HYDROcodone-acetaminophen (NORCO/VICODIN) 5-325 MG tablet Take 1-2 tablets by mouth every 6 (six) hours as needed (for pain; may cause constipation). Patient not taking: Reported on 01/05/2017 07/12/15   Molpus, Jonny Ruiz, MD  lisinopril (PRINIVIL,ZESTRIL) 10 MG tablet Take 1 tablet (10 mg total) by mouth daily. Patient not taking: Reported on 01/05/2017 08/28/14   Waldon Merl, PA-C  Vitamin D, Ergocalciferol, (DRISDOL) 50000 UNITS CAPS capsule Take 1  capsule (50,000 Units total) by mouth every 7 (seven) days. 09/15/14   Waldon MerlMartin, William C, PA-C   Allergies  Allergen Reactions  . Ace Inhibitors Cough  . Naprosyn [Naproxen] Nausea And Vomiting    Social History  Substance Use Topics  . Smoking status: Former Smoker    Quit date: 07/07/2014  . Smokeless tobacco: Never Used  . Alcohol use 6.0 oz/week    10 Standard drinks or equivalent per week     Comment: 2 beers a week    Family History  Problem Relation Age of Onset  . Stroke Father 3771       Deceased  . Depression Father   . Hypertension Father   . Heart  disease Father   . Hypertension Mother        Living  . Hypertension Sister   . Healthy Son        x1  . Healthy Daughter        x1     Review of Systems  Positive ROS: neg  All other systems have been reviewed and were otherwise negative with the exception of those mentioned in the HPI and as above.  Objective: Vital signs in last 24 hours: Temp:  [98.3 F (36.8 C)] 98.3 F (36.8 C) (07/13 0903) Pulse Rate:  [77] 77 (07/13 0903) Resp:  [20] 20 (07/13 0903) BP: (138)/(85) 138/85 (07/13 0903) SpO2:  [99 %] 99 % (07/13 0903) Weight:  [108 kg (238 lb)] 108 kg (238 lb) (07/13 0909)  General Appearance: Alert, cooperative, no distress, appears stated age Head: Normocephalic, without obvious abnormality, atraumatic Eyes: PERRL, conjunctiva/corneas clear, EOM's intact    Neck: Supple, symmetrical, trachea midline Back: Symmetric, no curvature, ROM normal, no CVA tenderness Lungs:  respirations unlabored Heart: Regular rate and rhythm Abdomen: Soft, non-tender Extremities: Extremities normal, atraumatic, no cyanosis or edema Pulses: 2+ and symmetric all extremities Skin: Skin color, texture, turgor normal, no rashes or lesions  NEUROLOGIC:   Mental status: Alert and oriented x4,  no aphasia, good attention span, fund of knowledge, and memory Motor Exam - grossly normal Sensory Exam - grossly normal Reflexes: 1+ Coordination - grossly normal Gait - grossly normal Balance - grossly normal Cranial Nerves: I: smell Not tested  II: visual acuity  OS: nl    OD: nl  II: visual fields Full to confrontation  II: pupils Equal, round, reactive to light  III,VII: ptosis None  III,IV,VI: extraocular muscles  Full ROM  V: mastication Normal  V: facial light touch sensation  Normal  V,VII: corneal reflex  Present  VII: facial muscle function - upper  Normal  VII: facial muscle function - lower Normal  VIII: hearing Not tested  IX: soft palate elevation  Normal  IX,X: gag reflex  Present  XI: trapezius strength  5/5  XI: sternocleidomastoid strength 5/5  XI: neck flexion strength  5/5  XII: tongue strength  Normal    Data Review Lab Results  Component Value Date   WBC 10.0 01/09/2017   HGB 12.6 01/09/2017   HCT 40.2 01/09/2017   MCV 83.4 01/09/2017   PLT 235 01/09/2017   Lab Results  Component Value Date   NA 138 01/09/2017   K 4.2 01/09/2017   CL 108 01/09/2017   CO2 20 (L) 01/09/2017   BUN 10 01/09/2017   CREATININE 0.77 01/09/2017   GLUCOSE 186 (H) 01/09/2017   Lab Results  Component Value Date   INR 0.92 01/09/2017    Assessment/Plan: Patient  admitted for PLIF L3-4, l4-5. Patient has failed a reasonable attempt at conservative therapy.  I explained the condition and procedure to the patient and answered any questions.  Patient wishes to proceed with procedure as planned. Understands risks/ benefits and typical outcomes of procedure.   JONES,DAVID S 01/16/2017 10:28 AM

## 2017-01-16 NOTE — Transfer of Care (Signed)
Immediate Anesthesia Transfer of Care Note  Patient: Rachael SnipesVenetia Becker  Procedure(s) Performed: Procedure(s): Posterior Lumbar Interbody Fusion Lumbar three-four, Lumbar four-five (N/A)  Patient Location: PACU  Anesthesia Type:General  Level of Consciousness: awake and alert   Airway & Oxygen Therapy: Patient Spontanous Breathing and Patient connected to face mask oxygen  Post-op Assessment: Report given to RN, Post -op Vital signs reviewed and stable and Patient moving all extremities X 4  Post vital signs: Reviewed and stable  Last Vitals:  Vitals:   01/16/17 0903 01/16/17 1504  BP: 138/85 (!) 165/82  Pulse: 77 77  Resp: 20 12  Temp: 36.8 C 36.7 C    Last Pain:  Vitals:   01/16/17 1504  TempSrc:   PainSc: 0-No pain         Complications: No apparent anesthesia complications

## 2017-01-17 LAB — GLUCOSE, CAPILLARY
GLUCOSE-CAPILLARY: 216 mg/dL — AB (ref 65–99)
Glucose-Capillary: 159 mg/dL — ABNORMAL HIGH (ref 65–99)
Glucose-Capillary: 209 mg/dL — ABNORMAL HIGH (ref 65–99)
Glucose-Capillary: 116 mg/dL — ABNORMAL HIGH (ref 65–99)

## 2017-01-17 NOTE — Progress Notes (Signed)
Patient ID: Rachael Becker, female   DOB: 06/25/1966, 51 y.o.   MRN: [id number] Subjective: Patient reports some back soreness, legs much better  Objective: Vital signs in last 24 hours: Temp:  [97.8 F (36.6 C)-99 F (37.2 C)] 97.8 F (36.6 C) (07/14 0518) Pulse Rate:  [73-95] 90 (07/14 0800) Resp:  [12-24] 18 (07/14 0518) BP: (116-193)/(71-116) 128/74 (07/14 0800) SpO2:  [93 %-100 %] 100 % (07/14 0800) Weight:  [114.4 kg (252 lb 1.6 oz)] 114.4 kg (252 lb 1.6 oz) (07/13 1852)  Intake/Output from previous day: 07/13 0701 - 07/14 0700 In: 3051 [P.O.:240; I.V.:2611; IV Piggyback:200] Out: 1350 [Urine:1200; Blood:150] Intake/Output this shift: No intake/output data recorded.  Neurologic: Grossly normal  Lab Results: Lab Results  Component Value Date   WBC 10.0 01/09/2017   HGB 12.6 01/09/2017   HCT 40.2 01/09/2017   MCV 83.4 01/09/2017   PLT 235 01/09/2017   Lab Results  Component Value Date   INR 0.92 01/09/2017   BMET Lab Results  Component Value Date   NA 138 01/09/2017   K 4.2 01/09/2017   CL 108 01/09/2017   CO2 20 (L) 01/09/2017   GLUCOSE 186 (H) 01/09/2017   BUN 10 01/09/2017   CREATININE 0.77 01/09/2017   CALCIUM 9.3 01/09/2017    Studies/Results: Dg Lumbar Spine 2-3 Views  Result Date: 01/16/2017 CLINICAL DATA:  L3-4 and L4-5 PLIF. EXAM: LUMBAR SPINE - 2-3 VIEW; DG C-ARM 61-120 MIN COMPARISON:  None. FINDINGS: Intraoperative images demonstrate pedicle screw and rod fixation at L3-4 and L4-5. Disc spacers are present at both disc levels. AP alignment is anatomic. IMPRESSION: Interval lumbar fusion at L3-4 and L4-5 without radiographic evidence for complication. Electronically Signed   By: Marin Robertshristopher  Mattern M.D.   On: 01/16/2017 14:51   Dg C-arm 1-60 Min  Result Date: 01/16/2017 CLINICAL DATA:  L3-4 and L4-5 PLIF. EXAM: LUMBAR SPINE - 2-3 VIEW; DG C-ARM 61-120 MIN COMPARISON:  None. FINDINGS: Intraoperative images demonstrate pedicle screw and rod  fixation at L3-4 and L4-5. Disc spacers are present at both disc levels. AP alignment is anatomic. IMPRESSION: Interval lumbar fusion at L3-4 and L4-5 without radiographic evidence for complication. Electronically Signed   By: Marin Robertshristopher  Mattern M.D.   On: 01/16/2017 14:51    Assessment/Plan: Doing well, prefers SNF   LOS: 1 day    [name] S 01/17/2017, 9:19 AM

## 2017-01-17 NOTE — Clinical Social Work Note (Addendum)
Clinical Social Work Assessment  Patient Details  Name: Rachael SnipesVenetia Becker MRN: [id number] Date of Birth: 04/15/1966  Date of referral:  01/17/17               Reason for consult:  Facility Placement, Discharge Planning                Permission sought to share information with:  Facility Industrial/product designerContact Representative Permission granted to share information::  Yes, Verbal Permission Granted  Name::        Agency::  SNF  Relationship::     Contact Information:     Housing/Transportation Living arrangements for the past 2 months:  Single Family Home Source of Information:  Patient Patient Interpreter Needed:  None Criminal Activity/Legal Involvement Pertinent to Current Situation/Hospitalization:  No - Comment as needed Significant Relationships:  Adult Children, Other Family Members Lives with:  Self Do you feel safe going back to the place where you live?  Yes Need for family participation in patient care:  No (Coment)  Care giving concerns:  Patient currently resides at home alone, but needs rehabilitation prior to returning home in order to successfully complete ADLs.   Social Worker assessment / plan:  CSW introduced self to patient and discussed role. CSW explained recommendation for SNF placement, and patient indicated preference [facility] [place], [place]. Patient [place]. CSW will fax out referral and follow to facilitate discharge.  Employment status:    Health and safety inspectornsurance information:  Managed Care PT Recommendations:  Skilled Nursing Facility Information / Referral to community resources:  Skilled Nursing Facility  Patient/Family's Response to care:  Patient agreeable to SNF placement.  Patient/Family's Understanding of and Emotional Response to Diagnosis, Current Treatment, and Prognosis:  Patient seems to understand need for additional support and rehabilitation at this time prior to returning home. Patient indicated understanding of  CSW role in discharge planning.  Emotional Assessment Appearance:  Appears stated age Attitude/Demeanor/Rapport:    Affect (typically observed):  Appropriate Orientation:  Oriented to Situation, Oriented to  Time, Oriented to Place, Oriented to Self Alcohol / Substance use:  Not Applicable Psych involvement (Current and /or in the community):  No (Comment)  Discharge Needs  Concerns to be addressed:  Care Coordination, Discharge Planning Concerns Readmission within the last 30 days:  No Current discharge risk:  Lives alone, Physical Impairment Barriers to Discharge:  Continued Medical Work up, Insurance Authorization   Baldemar Lenislizabeth M Jameson [place], [place] 01/17/2017, 3:38 PM

## 2017-01-17 NOTE — Evaluation (Signed)
Occupational Therapy Evaluation Patient Details Name: Rachael Becker MRN: [id number] DOB: 07/08/1965 Today's Date: 01/17/2017    History of Present Illness [age] y.o. s/p PLIF L3-L5. PMH includes bipolar, depression, DM, heart disease, HTN, HLD, Dysrhythmia.   Clinical Impression   Pt s/p above. Pt lives alone. Feel pt will benefit from acute OT to increase independence prior to d/c. Recommending SNF at this time.     Follow Up Recommendations  SNF    Equipment Recommendations  Other (comment) (defer to next venue)    Recommendations for Other Services       Precautions / Restrictions Precautions Precautions: Fall;Back Precaution Booklet Issued: No Precaution Comments: educated on back precautions Required Braces or Orthoses: Spinal Brace Spinal Brace: Lumbar corset;Applied in sitting position Restrictions Weight Bearing Restrictions: No      Mobility Bed Mobility Overal bed mobility: Needs Assistance Bed Mobility: Rolling;Sidelying to Sit Rolling: Min assist Sidelying to sit: Min assist       General bed mobility comments: cues for technique.  Transfers Overall transfer level: Needs assistance Equipment used: Rolling walker (2 wheeled) Transfers: Sit to/from UGI CorporationStand;Stand Pivot Transfers Sit to Stand: Min assist Stand pivot transfers: Min guard       General transfer comment: cues for technique.     Balance      Min guard for stand pivot transfer with RW. Took a few steps with Min guard assist with RW.                                     ADL either performed or assessed with clinical judgement   ADL Overall ADL's : Needs assistance/impaired Eating/Feeding: Sitting;Independent               Upper Body Dressing : Minimal assistance;Sitting   Lower Body Dressing: Maximal assistance;Sit to/from stand Lower Body Dressing Details (indicate cue type and reason): unable to cross ankles over knees to access feet. Toilet Transfer: Minimal  assistance;RW;Ambulation;Stand-pivot (sit to stand from bed)           Functional mobility during ADLs: Rolling walker;Minimal assistance (Min A-sit to stand; Min guard-steps) General ADL Comments: Explained that AE is available to assist with LB ADLs. Educated on back brace and assisted pt in donning back brace.     Vision         Perception     Praxis      Pertinent Vitals/Pain Pain Assessment: 0-10 Pain Score: 10-Worst pain ever Pain Location: back Pain Descriptors / Indicators: Sore Pain Intervention(s): Monitored during session;Repositioned     Hand Dominance     Extremity/Trunk Assessment Upper Extremity Assessment Upper Extremity Assessment: Overall WFL for tasks assessed   Lower Extremity Assessment Lower Extremity Assessment: Defer to PT evaluation       Communication Communication Communication: No difficulties   Cognition Arousal/Alertness: Awake/alert Behavior During Therapy: WFL for tasks assessed/performed Overall Cognitive Status: Within Functional Limits for tasks assessed                                     General Comments       Exercises     Shoulder Instructions      Home Living Family/patient expects to be discharged to:: Skilled nursing facility Living Arrangements: Alone   Type of Home: House Home Access: Stairs to enter Entergy CorporationEntrance Stairs-Number of Steps:  2 Entrance Stairs-Rails: Right Home Layout: One level     Bathroom Shower/Tub: Tub/shower unit         Home Equipment: Walker - 4 wheels;Shower seat          Prior Functioning/Environment          Comments: unsure if pt required assist with ADLs, but did use rollator prior to surgery        OT Problem List: Decreased strength;Decreased activity tolerance;Decreased range of motion;Impaired balance (sitting and/or standing);Decreased knowledge of use of DME or AE;Decreased knowledge of precautions;Pain;Obesity      OT Treatment/Interventions:  Self-care/ADL training;DME and/or AE instruction;Therapeutic activities;Patient/family education;Balance training    OT Goals(Current goals can be found in the care plan section) Acute Rehab OT Goals Patient Stated Goal: to walk outside OT Goal Formulation: With patient Time For Goal Achievement: 01/24/17 Potential to Achieve Goals: Good ADL Goals Pt Will Perform Lower Body Dressing: sit to/from stand;with adaptive equipment;with min guard assist Pt Will Transfer to Toilet: with set-up;with supervision;bedside commode;ambulating (3 in 1 over commode) Pt Will Perform Toileting - Clothing Manipulation and hygiene: with set-up;with supervision;sit to/from stand (with or without AE) Additional ADL Goal #1: Pt will independently verbalize 3/3 back precautions and maintain during session.   OT Frequency: Min 2X/week   Barriers to D/C:            Co-evaluation              AM-PAC PT "6 Clicks" Daily Activity     Outcome Measure Help from another person eating meals?: None Help from another person taking care of personal grooming?: A Little Help from another person toileting, which includes using toliet, bedpan, or urinal?: A Lot Help from another person bathing (including washing, rinsing, drying)?: A Lot Help from another person to put on and taking off regular upper body clothing?: A Little Help from another person to put on and taking off regular lower body clothing?: A Lot 6 Click Score: 16   End of Session Equipment Utilized During Treatment: Gait belt;Rolling walker;Back brace  Activity Tolerance: Patient tolerated treatment well (limited by legs feeling like they were going to give) Patient left: in chair;with call bell/phone within reach;with chair alarm set;Other (comment) (with nurse tech in room)  OT Visit Diagnosis: Pain Pain - Right/Left:  (back) Pain - part of body:  (back)                Time: 1191-4782 OT Time Calculation (min): 17 min Charges:  OT General  Charges $OT Visit: 1 Procedure OT Evaluation $OT Eval Moderate Complexity: 1 Procedure G-Codes:     Earlie Raveling OTR/L 01/17/2017, 7:35 AM

## 2017-01-17 NOTE — [place] FL2 (Signed)
[place] MEDICAID FL2 LEVEL OF CARE SCREENING TOOL     IDENTIFICATION  Patient Name: Rachael Becker Birthdate: 07/22/1965 Sex: female Admission Date (Current Location): 01/16/2017  [place]   Facility and Address:  [facility], [address], [place], [place] [address]      Provider Number: [id number]  Attending Physician Name and Address:  Tia AlertJones, David S, MD  Relative Name and Phone Number:       Current Level of Care: Hospital Recommended Level of Care: Skilled Nursing Facility Prior Approval Number:    Date Approved/Denied:   PASRR Number: Pending  Discharge Plan: SNF    Current Diagnoses: Patient Active Problem List   Diagnosis Date Noted  . S/P lumbar spinal fusion 01/16/2017  . Elevated hemoglobin A1c 09/18/2014  . Hypovitaminosis D 09/15/2014  . Visit for preventive health examination 09/11/2014  . Fatigue due to depression 09/11/2014  . Anxiety and depression 08/28/2014  . Essential hypertension, benign 08/28/2014  . Hyperlipidemia 08/28/2014  . Coronary artery disease involving coronary bypass graft of native heart with unspecified angina pectoris 08/28/2014    Orientation RESPIRATION BLADDER Height & Weight     Time, Self, Situation, Place  Normal Continent Weight: 252 lb 1.6 oz (114.4 kg) Height:  5\' 5"  (165.1 cm)  BEHAVIORAL SYMPTOMS/MOOD NEUROLOGICAL BOWEL NUTRITION STATUS      Continent Diet (cardiac; carb modified)  AMBULATORY STATUS COMMUNICATION OF NEEDS Skin   Limited Assist Verbally Surgical wounds                       Personal Care Assistance Level of Assistance  Bathing, Dressing Bathing Assistance: Limited assistance   Dressing Assistance: Limited assistance     Functional Limitations Info             SPECIAL CARE FACTORS FREQUENCY  PT (By licensed PT), OT (By licensed OT)     PT Frequency: 5x/wk OT Frequency: 5x/wk            Contractures       Additional Factors Info  Code Status, Allergies, Insulin Sliding Scale Code Status Info: full Allergies Info: Ace Inhibitors, Naprosyn Naproxen   Insulin Sliding Scale Info: 3x/day       Current Medications (01/17/2017):  This is the current hospital active medication list Current Facility-Administered Medications  Medication Dose Route Frequency Provider Last Rate Last Dose  . 0.9 %  sodium chloride infusion  250 mL Intravenous Continuous Tia AlertJones, David S, MD 1 mL/hr at 01/17/17 0700 250 mL at 01/17/17 0700  . 0.9 % NaCl with KCl 20 mEq/ L  infusion   Intravenous Continuous Tia AlertJones, David S, MD      . acetaminophen (TYLENOL) tablet 650 mg  650 mg Oral Q4H PRN Tia AlertJones, David S, MD       Or  . acetaminophen (TYLENOL) suppository 650 mg  650 mg Rectal Q4H PRN Tia AlertJones, David S, MD      . aspirin EC tablet 81 mg  81 mg Oral q1800 Tia AlertJones, David S, MD   81 mg at 01/16/17 2122  . carvedilol (COREG) tablet 6.25 mg  6.25 mg Oral BID WC Tia AlertJones, David S, MD   6.25 mg at 01/17/17 [id number]  . celecoxib (CELEBREX) capsule 200 mg  200 mg Oral Q12H Tia AlertJones, David S, MD   200 mg at 01/17/17 0831  . insulin aspart (novoLOG) injection 0-15 Units  0-15 Units Subcutaneous TID WC Tia AlertJones, David S,  MD   5 Units at 01/17/17 1217  . losartan (COZAAR) tablet 100 mg  100 mg Oral Daily Tia Alert, MD   100 mg at 01/17/17 0831  . menthol-cetylpyridinium (CEPACOL) lozenge 3 mg  1 lozenge Oral PRN Tia Alert, MD       Or  . phenol [facility]) mouth spray 1 spray  1 spray Mouth/Throat PRN Tia Alert, MD      . metFORMIN (GLUCOPHAGE) tablet 500 mg  500 mg Oral BID WC Tia Alert, MD   500 mg at 01/17/17 1610  . methocarbamol (ROBAXIN) tablet 500 mg  500 mg Oral Q6H PRN Tia Alert, MD   500 mg at 01/16/17 1601   Or  . methocarbamol (ROBAXIN) 500 mg in dextrose 5 % 50 mL IVPB  500 mg Intravenous Q6H PRN Tia Alert, MD      . morphine 2 MG/ML injection 2 mg  2 mg Intravenous Q2H PRN Tia Alert, MD   2 mg at  01/17/17 0830  . ondansetron (ZOFRAN) tablet 4 mg  4 mg Oral Q6H PRN Tia Alert, MD       Or  . ondansetron [facility]) injection 4 mg  4 mg Intravenous Q6H PRN Tia Alert, MD      . oxyCODONE (Oxy IR/ROXICODONE) immediate release tablet 5-10 mg  5-10 mg Oral Q4H PRN Tia Alert, MD   10 mg at 01/17/17 1218  . pantoprazole (PROTONIX) EC tablet 40 mg  40 mg Oral Daily Tia Alert, MD   40 mg at 01/17/17 9604  . QUEtiapine (SEROQUEL) tablet 200 mg  200 mg Oral QHS Tia Alert, MD   200 mg at 01/16/17 2122  . senna (SENOKOT) tablet 8.6 mg  1 tablet Oral BID Tia Alert, MD   8.6 mg at 01/17/17 5409  . sodium chloride flush (NS) 0.9 % injection 3 mL  3 mL Intravenous Q12H Tia Alert, MD   3 mL at 01/17/17 0834  . sodium chloride flush (NS) 0.9 % injection 3 mL  3 mL Intravenous PRN Tia Alert, MD      . venlafaxine XR [facility]) 24 hr capsule 75 mg  75 mg Oral Q breakfast Tia Alert, MD   75 mg at 01/17/17 0800     Discharge Medications: Please see discharge summary for a list of discharge medications.  Relevant Imaging Results:  Relevant Lab Results:   Additional Information SS#: [id number]  Baldemar Lenis, LCSW

## 2017-01-17 NOTE — Evaluation (Signed)
Physical Therapy Evaluation Patient Details Name: Rachael SnipesVenetia Becker MRN: [id number] DOB: 01/29/1966 Today's Date: 01/17/2017   History of Present Illness  51 y.o. s/p PLIF L3-L5. PMH includes bipolar, depression, DM, heart disease, HTN, HLD, Dysrhythmia.  Clinical Impression  Patient seen for mobility assessment s/p spinal surgery. Patient demonstrates deficits in functional mobility as indicated below. Will benefit from continued skilled PT to address deficits and maximize function. Will see as indicated and progress as tolerated. Patient resides alone with decreased caregiver support, would benefit from ST SNF upon acute discharge to maximize independence.      Follow Up Recommendations SNF    Equipment Recommendations  Rolling walker with 5" wheels    Recommendations for Other Services       Precautions / Restrictions Precautions Precautions: Fall;Back Precaution Booklet Issued: No Precaution Comments: educated on back precautions Required Braces or Orthoses: Spinal Brace Spinal Brace: Lumbar corset;Applied in sitting position Restrictions Weight Bearing Restrictions: No      Mobility  Bed Mobility               General bed mobility comments: received in chair  Transfers Overall transfer level: Needs assistance Equipment used: Rolling walker (2 wheeled) Transfers: Sit to/from [employer] CorporationStand;Stand Pivot Transfers Sit to Stand: Min assist Stand pivot transfers: Min guard       General transfer comment: cues for technique.   Ambulation/Gait Ambulation/Gait assistance: Min guard Ambulation Distance (Feet): 70 Feet Assistive device: Rolling walker (2 wheeled) Gait Pattern/deviations: Step-through pattern;Decreased stride length;Wide base of support Gait velocity: decreased Gait velocity interpretation: Below normal speed for age/gender General Gait Details: steady but guarded with ambulation. VCs for increased cadence and upright posture  Stairs             Wheelchair Mobility    Modified Rankin (Stroke Patients Only)       Balance Overall balance assessment: Needs assistance Sitting-balance support: Feet supported Sitting balance-Leahy Scale: Fair Sitting balance - Comments: able to tolerate EOB and BSC without assist   Standing balance support: Bilateral upper extremity supported Standing balance-Leahy Scale: Fair Standing balance comment: able to perform functional tasks with and without UE support                             Pertinent Vitals/Pain Pain Assessment: 0-10 Pain Score: 10-Worst pain ever Pain Location: back Pain Descriptors / Indicators: Sore Pain Intervention(s): Monitored during session;Premedicated before session    Home Living Family/patient expects to be discharged to:: Skilled nursing facility Living Arrangements: Alone   Type of Home: House Home Access: Stairs to enter Entrance Stairs-Rails: Right Entrance Stairs-Number of Steps: 2 Home Layout: One level Home Equipment: Walker - 4 wheels;Shower seat      Prior Function           Comments: unsure if pt required assist with ADLs, but did use rollator prior to surgery     Hand Dominance        Extremity/Trunk Assessment   Upper Extremity Assessment Upper Extremity Assessment: Overall WFL for tasks assessed    Lower Extremity Assessment Lower Extremity Assessment: Overall WFL for tasks assessed       Communication   Communication: No difficulties  Cognition Arousal/Alertness: Awake/alert Behavior During Therapy: WFL for tasks assessed/performed Overall Cognitive Status: Within Functional Limits for tasks assessed  General Comments      Exercises     Assessment/Plan    PT Assessment Patient needs continued PT services  PT Problem List Decreased activity tolerance;Decreased balance;Decreased mobility;Decreased knowledge of precautions;Pain       PT  Treatment Interventions Stair training;DME instruction;Gait training;Functional mobility training;Therapeutic activities;Therapeutic exercise;Patient/family education    PT Goals (Current goals can be found in the Care Plan section)  Acute Rehab PT Goals Patient Stated Goal: to get some rehab and be independent at home PT Goal Formulation: With patient Time For Goal Achievement: 01/31/17 Potential to Achieve Goals: Good    Frequency Min 5X/week   Barriers to discharge Decreased caregiver support      Co-evaluation               AM-PAC PT "6 Clicks" Daily Activity  Outcome Measure Difficulty turning over in bed (including adjusting bedclothes, sheets and blankets)?: Total Difficulty moving from lying on back to sitting on the side of the bed? : Total Difficulty sitting down on and standing up from a chair with arms (e.g., wheelchair, bedside commode, etc,.)?: A Little Help needed moving to and from a bed to chair (including a wheelchair)?: A Little Help needed walking in hospital room?: A Little Help needed climbing 3-5 steps with a railing? : A Lot 6 Click Score: 13    End of Session Equipment Utilized During Treatment: Gait belt;Back brace Activity Tolerance: Patient tolerated treatment well Patient left: in chair;with call bell/phone within reach;with chair alarm set Nurse Communication: Mobility status PT Visit Diagnosis: Unsteadiness on feet (R26.81);Difficulty in walking, not elsewhere classified (R26.2)    Time: 1610-9604 PT Time Calculation (min) (ACUTE ONLY): 22 min   Charges:   PT Evaluation $PT Eval Moderate Complexity: 1 Procedure     PT G CodesCharlotte Crumb, PT DPT NCS [phone]   Fabio Asa 01/17/2017, 8:00 AM

## 2017-01-18 ENCOUNTER — Inpatient Hospital Stay (HOSPITAL_COMMUNITY): Payer: BLUE CROSS/BLUE SHIELD

## 2017-01-18 ENCOUNTER — Encounter (HOSPITAL_COMMUNITY): Payer: Self-pay | Admitting: General Practice

## 2017-01-18 LAB — CBC
HCT: 37.4 % (ref 36.0–46.0)
Hemoglobin: 11.3 g/dL — ABNORMAL LOW (ref 12.0–15.0)
MCH: 25.5 pg — ABNORMAL LOW (ref 26.0–34.0)
MCHC: 30.2 g/dL (ref 30.0–36.0)
MCV: 84.4 fL (ref 78.0–100.0)
Platelets: 269 10*3/uL (ref 150–400)
RBC: 4.43 MIL/uL (ref 3.87–5.11)
RDW: 15.2 % (ref 11.5–15.5)
WBC: 11.5 10*3/uL — ABNORMAL HIGH (ref 4.0–10.5)

## 2017-01-18 LAB — GLUCOSE, CAPILLARY
GLUCOSE-CAPILLARY: 182 mg/dL — AB (ref 65–99)
GLUCOSE-CAPILLARY: 208 mg/dL — AB (ref 65–99)
Glucose-Capillary: 175 mg/dL — ABNORMAL HIGH (ref 65–99)
Glucose-Capillary: 132 mg/dL — ABNORMAL HIGH (ref 65–99)

## 2017-01-18 LAB — BASIC METABOLIC PANEL
Anion gap: 12 (ref 5–15)
BUN: 15 mg/dL (ref 6–20)
CO2: 26 mmol/L (ref 22–32)
Calcium: 9 mg/dL (ref 8.9–10.3)
Chloride: 99 mmol/L — ABNORMAL LOW (ref 101–111)
Creatinine, Ser: 0.92 mg/dL (ref 0.44–1.00)
GFR calc Af Amer: >60 mL/min (ref >60)
GFR calc non Af Amer: >60 mL/min (ref >60)
Glucose, Bld: 106 mg/dL — ABNORMAL HIGH (ref 65–99)
Potassium: 3.8 mmol/L (ref 3.5–5.1)
Sodium: 137 mmol/L (ref 135–145)

## 2017-01-18 MED ORDER — CARVEDILOL 6.25 MG PO TABS
6.2500 mg | ORAL_TABLET | Freq: Every morning | ORAL | Status: DC
  Administered 2017-01-20: 6.25 mg via ORAL
  Filled 2017-01-18 (×2): qty 1

## 2017-01-18 MED ORDER — METHOCARBAMOL 500 MG PO TABS: 500.0000 mg | ORAL_TABLET | Freq: Four times a day (QID) | ORAL | 1 refills | Status: DC | PRN

## 2017-01-18 MED ORDER — ELIQUIS 5 MG PO TABS: 5.0000 mg | ORAL_TABLET | Freq: Two times a day (BID) | ORAL | 2 refills | Status: AC

## 2017-01-18 MED ORDER — POLYETHYLENE GLYCOL 3350 17 G PO PACK
17.0000 g | PACK | Freq: Every day | ORAL | Status: DC | PRN
  Administered 2017-01-18: 17 g via ORAL
  Filled 2017-01-18: qty 1

## 2017-01-18 MED ORDER — HYDROCODONE-ACETAMINOPHEN 5-325 MG PO TABS: 1.0000 | ORAL_TABLET | Freq: Four times a day (QID) | ORAL | 0 refills | Status: DC | PRN

## 2017-01-18 NOTE — Care Management Note (Signed)
Case Management Note  Patient Details  Name: Rachael SnipesVenetia Becker MRN: [id number] Date of Birth: 02/04/1966  Subjective/Objective:                 Patient with order to DC to SNF. SNF placement facilitated by CSW.    Action/Plan:  DC to SNF today.  Expected Discharge Date:  01/18/17               Expected Discharge Plan:  Skilled Nursing Facility  In-House Referral:     Discharge planning Services  CM Consult  Post Acute Care Choice:    Choice offered to:     DME Arranged:    DME Agency:     HH Arranged:    HH Agency:     Status of Service:  Completed, signed off  If discussed at [employer] Meetings, dates discussed:    Additional Comments:  Lawerance SabalDebbie Cierah Crader, RN 01/18/2017, 8:42 AM

## 2017-01-18 NOTE — Progress Notes (Signed)
Pt seen and examined.  No issues overnight. No concerns this morning Pain manageable   EXAM: Temp:  [97.7 F (36.5 C)-98.7 F (37.1 C)] 98.4 F (36.9 C) (07/15 0500) Pulse Rate:  [57-90] 71 (07/15 0500) Resp:  [18] 18 (07/15 0500) BP: (94-160)/(51-79) 99/67 (07/15 0500) SpO2:  [96 %-100 %] 97 % (07/15 0500) Intake/Output      07/14 0701 - 07/15 0700 07/15 0701 - 07/16 0700   P.O. 120    Total Intake(mL/kg) 120 (1)    Net +120          Urine Occurrence 1 x     Awake and alert Follows commands throughout MAEW with good strength Incision c/d/i, no signs of infection  Plan Cleared for discharge to SNF should bed become available. Orders signed if this should happen today Otherwise, stable and continue current care

## 2017-01-18 NOTE — Discharge Summary (Signed)
Physician Discharge Summary  Patient ID: Rachael Becker MRN: [id number] DOB/AGE: 53/11/1965 53 y.o.  Admit date: 01/16/2017 Discharge date: 01/18/2017  Admission Diagnoses:  Lumbar radiculopathy  Discharge Diagnoses:  Same Active Problems:   S/P lumbar spinal fusion  Discharged Condition: Stable  Hospital Course:  Rachael SnipesVenetia Moening is a 51 y.o. female who was admitted for the below procedure. There were no post operative complications. At time of discharge, pain was well controlled, ambulating with Pt/OT, tolerating po, voiding normal. Ready for discharge. PT/OT rec SNF. Agree with this decision.  Treatments: Surgery 1. Decompressive lumbar laminectomy L3-4 and L4-5 requiring more work than would be required for a simple exposure of the disk for PLIF in order to adequately decompress the neural elements and address the spinal stenosis 2. Posterior lumbar interbody fusion L3-4 and L4-5 using PEEK interbody cages packed with morcellized allograft and autograft 3. Posterior fixation L3-L5 inclusive using Alphatec cortical pedicle screws.  4. Intertransverse arthrodesis L3-L5 using morcellized autograft and allograft.  Discharge Exam: Blood pressure 99/67, pulse 71, temperature 98.4 F (36.9 C), temperature source Oral, resp. rate 18, height 5\' 5"  (1.651 m), weight 114.4 kg (252 lb 1.6 oz), SpO2 97 %. Awake, alert, oriented Speech fluent, appropriate CN grossly intact MAEW with good strength Wound c/d/i  Disposition: SNF  Discharge Instructions    Call MD for:  difficulty breathing, headache or visual disturbances    Complete by:  As directed    Call MD for:  persistant dizziness or light-headedness    Complete by:  As directed    Call MD for:  redness, tenderness, or signs of infection (pain, swelling, redness, odor or green/yellow discharge around incision site)    Complete by:  As directed    Call MD for:  severe uncontrolled pain    Complete by:  As directed    Call MD for:   temperature >100.4    Complete by:  As directed    Diet general    Complete by:  As directed    Driving Restrictions    Complete by:  As directed    Do not drive until given clearance.   Increase activity slowly    Complete by:  As directed    Lifting restrictions    Complete by:  As directed    Do not lift anything >10lbs. Avoid bending and twisting in awkward positions. Avoid bending at the back.   May shower / Bathe    Complete by:  As directed    In 24 hours. Okay to wash wound with warm soapy water. Avoid scrubbing the wound. Pat dry.   Remove dressing in 24 hours    Complete by:  As directed      Allergies as of 01/18/2017      Reactions   Ace Inhibitors Cough   Naprosyn [naproxen] Nausea And Vomiting      Medication List    STOP taking these medications   GOODY BODY PAIN 500-325 MG Pack Generic drug:  Aspirin-Acetaminophen   oxyCODONE-acetaminophen 7.5-325 MG tablet Commonly known as:  PERCOCET     TAKE these medications   ALPRAZolam 1 MG tablet Commonly known as:  XANAX Take 1 tablet (1 mg total) by mouth 2 (two) times daily as needed for anxiety. What changed:  when to take this   APPLE CIDER VINEGAR PO Take 30 mg by mouth daily.   aspirin 81 MG tablet Take 81 mg by mouth every evening.   atorvastatin 80 MG tablet Commonly known  as:  LIPITOR Take 0.5 tablets (40 mg total) by mouth daily.   carvedilol 6.25 MG tablet Commonly known as:  COREG Take 6.25 mg by mouth 2 (two) times daily.   ELIQUIS 5 MG Tabs tablet Generic drug:  apixaban Take 1 tablet (5 mg total) by mouth 2 (two) times daily. Start taking on:  01/23/2017 What changed:  These instructions start on 01/23/2017. If you are unsure what to do until then, ask your doctor or other care provider.   hydrochlorothiazide 25 MG tablet Commonly known as:  HYDRODIURIL Take 1 tablet (25 mg total) by mouth daily.   HYDROcodone-acetaminophen 5-325 MG tablet Commonly known as:  NORCO/VICODIN Take 1-2  tablets by mouth every 6 (six) hours as needed (for pain; may cause constipation).   lisinopril 10 MG tablet Commonly known as:  PRINIVIL,ZESTRIL Take 1 tablet (10 mg total) by mouth daily.   losartan 100 MG tablet Commonly known as:  COZAAR Take 100 mg by mouth daily.   metFORMIN 500 MG tablet Commonly known as:  GLUCOPHAGE Take 500 mg by mouth 2 (two) times daily with a meal.   methocarbamol 500 MG tablet Commonly known as:  ROBAXIN Take 1 tablet (500 mg total) by mouth every 6 (six) hours as needed for muscle spasms.   omeprazole 20 MG capsule Commonly known as:  PRILOSEC Take 20 mg by mouth daily.   QUEtiapine 100 MG tablet Commonly known as:  SEROQUEL Take 200 mg by mouth at bedtime.   venlafaxine XR 75 MG 24 hr capsule Commonly known as:  EFFEXOR XR Take 1 capsule (75 mg total) by mouth daily with breakfast. What changed:  when to take this   Vitamin D (Ergocalciferol) 50000 units Caps capsule Commonly known as:  DRISDOL Take 1 capsule (50,000 Units total) by mouth every 7 (seven) days.            Durable Medical Equipment        Start     Ordered   01/18/17 [id number]  For home use only DME Walker rolling  [facility])  Once    Question:  Patient needs a walker to treat with the following condition  Answer:  Gait instability   01/18/17 0705   01/16/17 1846  DME Walker rolling  Once    Question:  Patient needs a walker to treat with the following condition  Answer:  S/P lumbar fusion   01/16/17 1845   01/16/17 1846  DME 3 n 1  Once     01/16/17 1845     Follow-up Information    Tia Alert, MD Follow up.   Specialty:  Neurosurgery Contact information: [address] [place] [address] [phone]           Signed: Alyson Ingles 01/18/2017, 7:08 AM

## 2017-01-18 NOTE — Progress Notes (Signed)
Patient blood pressure is low Rn checked it was 78/42 manual, t 98.8, pulse 79, o2 97 RA; Pt is not dizziness or short or breath . Second Rn checked Bp and resulted in 78/42 manual . MD Page notified and ordered CBC, BMP and 500 ML bolus. Will continue to monitor.

## 2017-01-18 NOTE — Progress Notes (Signed)
CSW following for disposition/ DC planning. Pt worked up for SNF placement. PASSR manual review- CSW faxed requested information. No bed offers in HUB- awaiting PASSR and bed offers. Will need BCBS pre-auth once facility decided on.   Ilean SkillMeghan Erinne Gillentine, MSW, LCSW Clinical Social Work 01/18/2017 Weekend coverage [phone]

## 2017-01-18 NOTE — Progress Notes (Signed)
Physical Therapy Treatment Patient Details Name: Rachael SnipesVenetia Rodelo MRN: [id number] DOB: 02/05/1966 Today's Date: 01/18/2017    History of Present Illness 51 y.o. s/p PLIF L3-L5. PMH includes bipolar, depression, DM, heart disease, HTN, HLD, Dysrhythmia.    PT Comments    Patient progressing well with mobility, tolerated increased distance today continues to require cues for posture and positioning.   Follow Up Recommendations  SNF     Equipment Recommendations  Rolling walker with 5" wheels    Recommendations for Other Services       Precautions / Restrictions Precautions Precautions: Fall;Back Precaution Booklet Issued: No Precaution Comments: educated on back precautions Required Braces or Orthoses: Spinal Brace Spinal Brace: Lumbar corset;Applied in sitting position Restrictions Weight Bearing Restrictions: No    Mobility  Bed Mobility               General bed mobility comments: received in chair  Transfers Overall transfer level: Needs assistance Equipment used: Rolling walker (2 wheeled) Transfers: Sit to/from [employer] CorporationStand;Stand Pivot Transfers Sit to Stand: Min assist Stand pivot transfers: Min guard       General transfer comment: cues for technique.   Ambulation/Gait Ambulation/Gait assistance: Min guard Ambulation Distance (Feet): 120 Feet Assistive device: Rolling walker (2 wheeled) Gait Pattern/deviations: Step-through pattern;Decreased stride length;Wide base of support Gait velocity: decreased Gait velocity interpretation: Below normal speed for age/gender General Gait Details: steady but guarded with ambulation. VCs for increased cadence and upright posture   Stairs            Wheelchair Mobility    Modified Rankin (Stroke Patients Only)       Balance Overall balance assessment: Needs assistance Sitting-balance support: Feet supported Sitting balance-Leahy Scale: Good     Standing balance support: Bilateral upper extremity  supported Standing balance-Leahy Scale: Fair Standing balance comment: able to static stand to adjust brace without UE support                            Cognition Arousal/Alertness: Awake/alert Behavior During Therapy: WFL for tasks assessed/performed Overall Cognitive Status: Within Functional Limits for tasks assessed                                        Exercises      General Comments        Pertinent Vitals/Pain Pain Assessment: 0-10 Pain Score: 10-Worst pain ever (no significant signs of distress) Pain Location: back and hip Pain Descriptors / Indicators: Sore Pain Intervention(s): Monitored during session    Home Living                      Prior Function            PT Goals (current goals can now be found in the care plan section) Acute Rehab PT Goals Patient Stated Goal: to get some rehab and be independent at home PT Goal Formulation: With patient Time For Goal Achievement: 01/31/17 Potential to Achieve Goals: Good Progress towards PT goals: Progressing toward goals    Frequency    Min 5X/week      PT Plan Current plan remains appropriate    Co-evaluation              AM-PAC PT "6 Clicks" Daily Activity  Outcome Measure  Difficulty turning over in bed (including adjusting bedclothes, sheets and  blankets)?: Total Difficulty moving from lying on back to sitting on the side of the bed? : Total Difficulty sitting down on and standing up from a chair with arms (e.g., wheelchair, bedside commode, etc,.)?: A Little Help needed moving to and from a bed to chair (including a wheelchair)?: A Little Help needed walking in hospital room?: A Little Help needed climbing 3-5 steps with a railing? : A Lot 6 Click Score: 13    End of Session Equipment Utilized During Treatment: Gait belt;Back brace Activity Tolerance: Patient tolerated treatment well Patient left: in chair;with call bell/phone within reach;with  chair alarm set Nurse Communication: Mobility status PT Visit Diagnosis: Unsteadiness on feet (R26.81);Difficulty in walking, not elsewhere classified (R26.2)     Time: 1610-9604 PT Time Calculation (min) (ACUTE ONLY): 17 min  Charges:  $Gait Training: 8-22 mins                    G Codes:       Charlotte Crumb, PT DPT NCS [phone]    Fabio Asa 01/18/2017, 9:26 AM

## 2017-01-19 LAB — GLUCOSE, CAPILLARY
GLUCOSE-CAPILLARY: 159 mg/dL — AB (ref 65–99)
GLUCOSE-CAPILLARY: 119 mg/dL — AB (ref 65–99)
Glucose-Capillary: 180 mg/dL — ABNORMAL HIGH (ref 65–99)
Glucose-Capillary: 141 mg/dL — ABNORMAL HIGH (ref 65–99)

## 2017-01-19 NOTE — Progress Notes (Signed)
[facility] that pt would not get approval from insurance company.  CSW informed pt that she does not qualify under BCBS guidelines for SNF stay ( 140 supervision) and would need to go home with home services.    Pt expresses understanding and is comfortable with return home with home services  RNCM informed of need for pt to return home  Burna SisJenna H. Rylynn Schoneman, LCSW Clinical Social Worker [phone]

## 2017-01-19 NOTE — Progress Notes (Signed)
Patient ID: Wendi SnipesVenetia Jeff, female   DOB: 04/03/1966, 51 y.o.   MRN: [id number] Subjective: Patient reports she's doing ok  Objective: Vital signs in last 24 hours: Temp:  [98.1 F (36.7 C)-99.3 F (37.4 C)] 98.9 F (37.2 C) (07/16 0455) Pulse Rate:  [62-101] 82 (07/16 0455) Resp:  [18] 18 (07/16 0455) BP: (78-161)/(42-84) 109/58 (07/16 0455) SpO2:  [93 %-100 %] 95 % (07/16 0455)  Intake/Output from previous day: 07/15 0701 - 07/16 0700 In: 803 [P.O.:800; I.V.:3] Out: -  Intake/Output this shift: Total I/O In: 3 [I.V.:3] Out: -   Neurologic: Grossly normal  Lab Results: Lab Results  Component Value Date   WBC 11.5 (H) 01/18/2017   HGB 11.3 (L) 01/18/2017   HCT 37.4 01/18/2017   MCV 84.4 01/18/2017   PLT 269 01/18/2017   Lab Results  Component Value Date   INR 0.92 01/09/2017   BMET Lab Results  Component Value Date   NA 137 01/18/2017   K 3.8 01/18/2017   CL 99 (L) 01/18/2017   CO2 26 01/18/2017   GLUCOSE 106 (H) 01/18/2017   BUN 15 01/18/2017   CREATININE 0.92 01/18/2017   CALCIUM 9.0 01/18/2017    Studies/Results: Dg Chest 2 View  Result Date: 01/18/2017 CLINICAL DATA:  51 year old female with history of infiltrate in the lungs noted on prior examination. Followup study. EXAM: CHEST  2 VIEW COMPARISON:  Chest x-ray 01/09/2017. FINDINGS: There continues to be widespread interstitial prominence and diffuse peribronchial cuffing, however, the patchy airspace consolidations scattered throughout both lungs on the prior study has partially resolved. No pleural effusions. No cephalization of the pulmonary vasculature. Heart size appears borderline enlarged, and accentuated by patient's rotation to the left. Upper mediastinal contours are within normal limits allowing patient rotation. Status post median sternotomy for CABG. IMPRESSION: 1. Partial resolution of previously noted multifocal airspace consolidation, likely to reflect a resolving multilobar bronchopneumonia.  There continues to be widespread interstitial prominence and diffuse peribronchial cuffing. The possibility of underlying interstitial lung disease should be considered, and follow-up chest x-ray is recommended in 2-4 weeks to ensure complete resolution of these findings. Should these findings not completely resolve, follow-up high-resolution chest CT would be recommended at that time. Electronically Signed   By: Trudie Reedaniel  Entrikin M.D.   On: 01/18/2017 18:24    Assessment/Plan: hopefully to snf today, CXR reviewed will repeat in 2-4 wks   LOS: 3 days    Oryan Winterton S 01/19/2017, 6:51 AM

## 2017-01-19 NOTE — Progress Notes (Signed)
Physical Therapy Treatment Patient Details Name: Rachael Becker MRN: [id number] DOB: [birth year] Today's Date: 01/19/2017    History of Present Illness 51 y.o. s/p PLIF L3-L5. PMH includes bipolar, depression, DM, heart disease, HTN, HLD, Dysrhythmia.    PT Comments    Patient seen for activity progression- slow progression of activity, continues to require cues and supervision assist for activities throughout session. Still with reliance on RW unable to ambulate safely without RW at this time.  Reinforced safety and precautions with patient. Patient receptive. Will continue to see and progress as tolerated.   Follow Up Recommendations  SNF     Equipment Recommendations  Rolling walker with 5" wheels    Recommendations for Other Services       Precautions / Restrictions Precautions Precautions: Fall;Back Precaution Booklet Issued: No Precaution Comments: recall of 2/3 precautions Required Braces or Orthoses: Spinal Brace Spinal Brace: Lumbar corset;Applied in sitting position Restrictions Weight Bearing Restrictions: No    Mobility  Bed Mobility               General bed mobility comments: reports continued difficulty with bed mobility  Transfers Overall transfer level: Needs assistance Equipment used: Rolling walker (2 wheeled) Transfers: Sit to/from Stand Sit to Stand: Min guard         General transfer comment: VCs for positioning at edge of chair. Able to perform min guard but continues to have difficulty requiring increased time and effort  Ambulation/Gait Ambulation/Gait assistance: Supervision Ambulation Distance (Feet): 140 Feet Assistive device: Rolling walker (2 wheeled) Gait Pattern/deviations: Step-through pattern;Decreased stride length;Wide base of support Gait velocity: decreased Gait velocity interpretation: Below normal speed for age/gender General Gait Details: improved stability with use of RW, unable to mobilize without RW due to  instbaility and anxiousness with gait.   Stairs            Wheelchair Mobility    Modified Rankin (Stroke Patients Only)       Balance Overall balance assessment: Needs assistance Sitting-balance support: Feet supported Sitting balance-Leahy Scale: Good     Standing balance support: Bilateral upper extremity supported Standing balance-Leahy Scale: Fair Standing balance comment: able to perform functional task without UE support                            Cognition Arousal/Alertness: Awake/alert Behavior During Therapy: WFL for tasks assessed/performed Overall Cognitive Status: Within Functional Limits for tasks assessed                                        Exercises      General Comments General comments (skin integrity, edema, etc.): encouraged continued mobility, reviewed precautions with patient for reinforcement.       Pertinent Vitals/Pain Pain Assessment: 0-10 Pain Score: 4  Pain Location: back and hip Pain Descriptors / Indicators: Sore Pain Intervention(s): Monitored during session    Home Living                      Prior Function            PT Goals (current goals can now be found in the care plan section) Acute Rehab PT Goals Patient Stated Goal: to get some rehab and be independent at home PT Goal Formulation: With patient Time For Goal Achievement: 01/31/17 Potential to Achieve Goals: Good Progress towards PT  goals: Progressing toward goals    Frequency    Min 5X/week      PT Plan Current plan remains appropriate    Co-evaluation              AM-PAC PT "6 Clicks" Daily Activity  Outcome Measure  Difficulty turning over in bed (including adjusting bedclothes, sheets and blankets)?: Total Difficulty moving from lying on back to sitting on the side of the bed? : Total Difficulty sitting down on and standing up from a chair with arms (e.g., wheelchair, bedside commode, etc,.)?: A  Little Help needed moving to and from a bed to chair (including a wheelchair)?: A Little Help needed walking in hospital room?: A Little Help needed climbing 3-5 steps with a railing? : A Lot 6 Click Score: 13    End of Session Equipment Utilized During Treatment: Gait belt;Back brace Activity Tolerance: Patient tolerated treatment well Patient left: in chair;with call bell/phone within reach;with chair alarm set Nurse Communication: Mobility status PT Visit Diagnosis: Unsteadiness on feet (R26.81);Difficulty in walking, not elsewhere classified (R26.2)     Time: [id number] PT Time Calculation (min) (ACUTE ONLY): 18 min  Charges:  $Gait Training: 8-22 mins                    G Codes:       Charlotte Crumbevon Ellinor Test, PT DPT NCS [phone]    Fabio AsaDevon J Airianna Kreischer 01/19/2017, 9:41 AM

## 2017-01-20 LAB — GLUCOSE, CAPILLARY
GLUCOSE-CAPILLARY: 150 mg/dL — AB (ref 65–99)
GLUCOSE-CAPILLARY: 161 mg/dL — AB (ref 65–99)
Glucose-Capillary: 141 mg/dL — ABNORMAL HIGH (ref 65–99)

## 2017-01-20 MED ORDER — SODIUM CHLORIDE 0.9 % IV BOLUS (SEPSIS)
500.0000 mL | Freq: Once | INTRAVENOUS | Status: AC
  Administered 2017-01-20: 500 mL via INTRAVENOUS

## 2017-01-20 NOTE — Discharge Summary (Signed)
Physician Discharge Summary  Patient ID: Rachael Becker MRN: [id number] DOB/AGE: 1965/10/16 51 y.o.  Admit date: 01/16/2017 Discharge date: 01/20/2017  Admission Diagnoses: lumbar stenosis with instability    Discharge Diagnoses: same   Discharged Condition: good  Hospital Course: The patient was admitted on 01/16/2017 and taken to the operating room where the patient underwent PLIF. The patient tolerated the procedure well and was taken to the recovery room and then to the floor in stable condition. The hospital course was routine. There were no complications. The wound remained clean dry and intact. Pt had appropriate back soreness. No complaints of leg pain or new N/T/W. The patient remained afebrile with stable vital signs, and tolerated a regular diet. The patient continued to increase activities with PT/OT, and pain was well controlled with oral pain medications. Original hope of pt was d/c to SNF, but she did not qualify, so she is discharged home with [facility].  Consults: None  Significant Diagnostic Studies:  Results for orders placed or performed during the hospital encounter of 01/16/17  Glucose, capillary  Result Value Ref Range   Glucose-Capillary 177 (H) 65 - 99 mg/dL  Glucose, capillary  Result Value Ref Range   Glucose-Capillary 233 (H) 65 - 99 mg/dL   Comment 1 Notify RN    Comment 2 Document in Chart   Glucose, capillary  Result Value Ref Range   Glucose-Capillary 319 (H) 65 - 99 mg/dL  Glucose, capillary  Result Value Ref Range   Glucose-Capillary 159 (H) 65 - 99 mg/dL   Comment 1 Notify RN    Comment 2 Document in Chart   Glucose, capillary  Result Value Ref Range   Glucose-Capillary 209 (H) 65 - 99 mg/dL   Comment 1 Notify RN    Comment 2 Document in Chart   Glucose, capillary  Result Value Ref Range   Glucose-Capillary 116 (H) 65 - 99 mg/dL   Comment 1 Notify RN    Comment 2 Document in Chart   Glucose, capillary  Result Value Ref Range   Glucose-Capillary 216 (H) 65 - 99 mg/dL   Comment 1 Notify RN    Comment 2 Document in Chart   Glucose, capillary  Result Value Ref Range   Glucose-Capillary 175 (H) 65 - 99 mg/dL   Comment 1 Notify RN    Comment 2 Document in Chart   Glucose, capillary  Result Value Ref Range   Glucose-Capillary 182 (H) 65 - 99 mg/dL  CBC  Result Value Ref Range   WBC 11.5 (H) 4.0 - 10.5 K/uL   RBC 4.43 3.87 - 5.11 MIL/uL   Hemoglobin 11.3 (L) 12.0 - 15.0 g/dL   HCT 40.9 81.1 - 91.4 %   MCV 84.4 78.0 - 100.0 fL   MCH 25.5 (L) 26.0 - 34.0 pg   MCHC 30.2 30.0 - 36.0 g/dL   RDW 78.2 95.6 - 21.3 %   Platelets 269 150 - 400 K/uL  Basic metabolic panel  Result Value Ref Range   Sodium 137 135 - 145 mmol/L   Potassium 3.8 3.5 - 5.1 mmol/L   Chloride 99 (L) 101 - 111 mmol/L   CO2 26 22 - 32 mmol/L   Glucose, Bld 106 (H) 65 - 99 mg/dL   BUN 15 6 - 20 mg/dL   Creatinine, Ser 0.86 0.44 - 1.00 mg/dL   Calcium 9.0 8.9 - 57.8 mg/dL   GFR calc non Af Amer >60 >60 mL/min   GFR calc Af Amer >60 >60 mL/min  Anion gap 12 5 - 15  Glucose, capillary  Result Value Ref Range   Glucose-Capillary 208 (H) 65 - 99 mg/dL  Glucose, capillary  Result Value Ref Range   Glucose-Capillary 132 (H) 65 - 99 mg/dL   Comment 1 Notify RN    Comment 2 Document in Chart   Glucose, capillary  Result Value Ref Range   Glucose-Capillary 180 (H) 65 - 99 mg/dL   Comment 1 Notify RN    Comment 2 Document in Chart   Glucose, capillary  Result Value Ref Range   Glucose-Capillary 159 (H) 65 - 99 mg/dL  Glucose, capillary  Result Value Ref Range   Glucose-Capillary 119 (H) 65 - 99 mg/dL  Glucose, capillary  Result Value Ref Range   Glucose-Capillary 141 (H) 65 - 99 mg/dL   Comment 1 Notify RN    Comment 2 Document in Chart   Glucose, capillary  Result Value Ref Range   Glucose-Capillary 141 (H) 65 - 99 mg/dL   Comment 1 Notify RN    Comment 2 Document in Chart     Dg Chest 2 View  Result Date: 01/18/2017 CLINICAL  DATA:  51 year old female with history of infiltrate in the lungs noted on prior examination. Followup study. EXAM: CHEST  2 VIEW COMPARISON:  Chest x-ray 01/09/2017. FINDINGS: There continues to be widespread interstitial prominence and diffuse peribronchial cuffing, however, the patchy airspace consolidations scattered throughout both lungs on the prior study has partially resolved. No pleural effusions. No cephalization of the pulmonary vasculature. Heart size appears borderline enlarged, and accentuated by patient's rotation to the left. Upper mediastinal contours are within normal limits allowing patient rotation. Status post median sternotomy for CABG. IMPRESSION: 1. Partial resolution of previously noted multifocal airspace consolidation, likely to reflect a resolving multilobar bronchopneumonia. There continues to be widespread interstitial prominence and diffuse peribronchial cuffing. The possibility of underlying interstitial lung disease should be considered, and follow-up chest x-ray is recommended in 2-4 weeks to ensure complete resolution of these findings. Should these findings not completely resolve, follow-up high-resolution chest CT would be recommended at that time. Electronically Signed   By: Trudie Reed M.D.   On: 01/18/2017 18:24   Chest 2 View  Result Date: 01/09/2017 CLINICAL DATA:  Preoperative evaluation for upcoming lumbar surgery EXAM: CHEST  2 VIEW COMPARISON:  None. FINDINGS: Cardiac shadow is mildly enlarged. Postsurgical changes are noted. Diffuse airspace opacities are identified throughout both lungs predominately in the upper lobes. This may be of a chronic nature. No focal confluent infiltrate is seen. No sizable effusion is noted. No bony abnormality is seen. IMPRESSION: Diffuse airspace opacities bilaterally which may be of a chronic fibrotic nature. Given lack of previous imaging a short-term follow-up is recommended to assess for stability. This could be performed in  4-6 weeks. Alternatively a CT of the chest could be performed for better characterization of the opacities. Electronically Signed   By: Alcide Clever M.D.   On: 01/09/2017 09:43   Dg Lumbar Spine 2-3 Views  Result Date: 01/16/2017 CLINICAL DATA:  L3-4 and L4-5 PLIF. EXAM: LUMBAR SPINE - 2-3 VIEW; DG C-ARM 61-120 MIN COMPARISON:  None. FINDINGS: Intraoperative images demonstrate pedicle screw and rod fixation at L3-4 and L4-5. Disc spacers are present at both disc levels. AP alignment is anatomic. IMPRESSION: Interval lumbar fusion at L3-4 and L4-5 without radiographic evidence for complication. Electronically Signed   By: Marin Roberts M.D.   On: 01/16/2017 14:51   Dg C-arm 1-60 Min  Result Date: 01/16/2017 CLINICAL DATA:  L3-4 and L4-5 PLIF. EXAM: LUMBAR SPINE - 2-3 VIEW; DG C-ARM 61-120 MIN COMPARISON:  None. FINDINGS: Intraoperative images demonstrate pedicle screw and rod fixation at L3-4 and L4-5. Disc spacers are present at both disc levels. AP alignment is anatomic. IMPRESSION: Interval lumbar fusion at L3-4 and L4-5 without radiographic evidence for complication. Electronically Signed   By: Marin Robertshristopher  Mattern M.D.   On: 01/16/2017 14:51    Antibiotics:  Anti-infectives    Start     Dose/Rate Route Frequency Ordered Stop   01/16/17 1915  ceFAZolin (ANCEF) IVPB 2g/100 mL premix     2 g 200 mL/hr over 30 Minutes Intravenous Every 8 hours 01/16/17 1845 01/17/17 0648   01/16/17 1426  vancomycin (VANCOCIN) powder  Status:  Discontinued       As needed 01/16/17 1426 01/16/17 1458   01/16/17 1158  bacitracin 50,000 Units in sodium chloride irrigation 0.9 % 500 mL irrigation  Status:  Discontinued       As needed 01/16/17 1158 01/16/17 1458   01/16/17 0915  ceFAZolin (ANCEF) IVPB 2g/100 mL premix     2 g 200 mL/hr over 30 Minutes Intravenous To Surgery 01/16/17 0857 01/16/17 1145   01/16/17 0903  ceFAZolin (ANCEF) 2-4 GM/100ML-% IVPB    Comments:  Schonewitz, Leigh   : cabinet  override      01/16/17 0903 01/16/17 1115      Discharge Exam: Blood pressure 128/72, pulse 80, temperature 97.8 F (36.6 C), temperature source Oral, resp. rate 18, height 5\' 5"  (1.651 m), weight 114.4 kg (252 lb 1.6 oz), SpO2 93 %. Neurologic: Grossly normal Dressing dry  Discharge Medications:   See list on previous discharge summary  [facility]   Final Dx: PLIF  Discharge Instructions    Call MD for:  difficulty breathing, headache or visual disturbances    Complete by:  As directed    Call MD for:  persistant dizziness or light-headedness    Complete by:  As directed    Call MD for:  redness, tenderness, or signs of infection (pain, swelling, redness, odor or green/yellow discharge around incision site)    Complete by:  As directed    Call MD for:  severe uncontrolled pain    Complete by:  As directed    Call MD for:  temperature >100.4    Complete by:  As directed    Diet general    Complete by:  As directed    Driving Restrictions    Complete by:  As directed    Do not drive until given clearance.   Face-to-face encounter (required for Medicare/Medicaid patients)    Complete by:  As directed    I Jeronda Don S certify that this patient is under my care and that I, or a nurse practitioner or physician's assistant working with me, had a face-to-face encounter that meets the physician face-to-face encounter requirements with this patient on 01/20/2017. The encounter with the patient was in whole, or in part for the following medical condition(s) which is the primary reason for home health care (List medical condition): lumbar fusion   The encounter with the patient was in whole, or in part, for the following medical condition, which is the primary reason for home health care:  lumbar fusion   I certify that, based on my findings, the following services are medically necessary home health services:  Physical therapy   Reason for Medically Necessary Home Health  Services:  Therapy- Therapeutic Exercises to Increase Strength and Endurance   My clinical findings support the need for the above services:  Pain interferes with ambulation/mobility   Further, I certify that my clinical findings support that this patient is homebound due to:  Pain interferes with ambulation/mobility   Increase activity slowly    Complete by:  As directed    Lifting restrictions    Complete by:  As directed    Do not lift anything >10lbs. Avoid bending and twisting in awkward positions. Avoid bending at the back.   May shower / Bathe    Complete by:  As directed    In 24 hours. Okay to wash wound with warm soapy water. Avoid scrubbing the wound. Pat dry.   Remove dressing in 24 hours    Complete by:  As directed       Follow-up Information    Tia Alert, MD Follow up.   Specialty:  Neurosurgery Contact information: [address] [place] [address] [phone]            Signed: Tia Alert 01/20/2017, 7:52 AM

## 2017-01-20 NOTE — Progress Notes (Signed)
RN spoke to Dr. Jordan LikesPool, patient's bp increased to 108/67. Patient denies any sx.  Per Dr. Jordan LikesPool, patient should stay off BP meds x3 days, then resume. Patient educated about coreg, eliquis for Afib, patient states she will be calling pcp tomorrow and following up. States she will check bp and HR tonight.  Neuro assessment unchanged.

## 2017-01-20 NOTE — Progress Notes (Signed)
RN discussed eliquis for afib and coreg use for patient. Per Dr. Jordan LikesPool, patient may resume all bp meds on 01/23/17 and eliquis 01/23/17 per Dr. Yetta BarreJones. Patient made aware.

## 2017-01-20 NOTE — Care Management Note (Signed)
Case Management Note  Patient Details  Name: Rachael SnipesVenetia Becker MRN: [id number] Date of Birth: 02/27/1966   Action/Plan: CM talked to patient to offer HHC choice, [facility]; [facility] with [facility] for arrangements; [facility] and to be delivered to the patient's room today prior to discharging home;  Expected Discharge Date:  01/19/17               Expected Discharge Plan:  Home w Home Health Services  Discharge planning Services  CM Consult  Choice offered to:  Patient  DME Arranged:  Walker rolling, 3-N-1 DME Agency:   [facility]  HH Arranged:  PT [facility]  Status of Service:  Completed, signed off  Reola MosherChandler, Juwon Scripter L, RN,MHA,BSN [phone] 01/20/2017, 12:08 PM

## 2017-01-20 NOTE — Progress Notes (Addendum)
Physical Therapy Treatment Patient Details Name: Rachael Becker MRN: [id number] DOB: 07/04/1966 Today's Date: 01/20/2017    History of Present Illness 51 y.o. s/p PLIF L3-L5. PMH includes bipolar, depression, DM, heart disease, HTN, HLD, Dysrhythmia.    PT Comments    Patient progressing well with activity tolerance and mobility. Performed increased ambulation, stair negotiation, and was receptive to education on car transfers and mobility expectations. Plan for patient to d/c home. Encouraged continued mobility at home.   Follow Up Recommendations  Home health PT, Supervision for mobility     Equipment Recommendations  Rolling walker with 5" wheels    Recommendations for Other Services       Precautions / Restrictions Precautions Precautions: Fall;Back Precaution Booklet Issued: No Precaution Comments: 3/3 precautions recalled Required Braces or Orthoses: Spinal Brace Spinal Brace: Lumbar corset;Applied in sitting position Restrictions Weight Bearing Restrictions: No    Mobility  Bed Mobility                  Transfers Overall transfer level: Needs assistance Equipment used: Rolling walker (2 wheeled) Transfers: Sit to/from Stand Sit to Stand: Supervision         General transfer comment: VCs for positioning at edge of chair. Able to perform min guard but continues to have difficulty requiring increased time and effort  Ambulation/Gait Ambulation/Gait assistance: Supervision Ambulation Distance (Feet): 220 Feet Assistive device: Rolling walker (2 wheeled) Gait Pattern/deviations: Step-through pattern;Decreased stride length;Wide base of support Gait velocity: decreased Gait velocity interpretation: Below normal speed for age/gender General Gait Details: continues to show improvements in distance and activity tolerance. VCs for increased cadence   Stairs Stairs: Yes   Stair Management: Two rails;Step to pattern Number of Stairs: 2 General stair  comments: min guard for safety, VCs for sequencing  Wheelchair Mobility    Modified Rankin (Stroke Patients Only)       Balance Overall balance assessment: Needs assistance Sitting-balance support: Feet supported Sitting balance-Leahy Scale: Good     Standing balance support: Bilateral upper extremity supported Standing balance-Leahy Scale: Fair Standing balance comment: improving static standing, able to release UE support as needed                            Cognition Arousal/Alertness: Awake/alert Behavior During Therapy: WFL for tasks assessed/performed Overall Cognitive Status: Within Functional Limits for tasks assessed                                        Exercises      General Comments        Pertinent Vitals/Pain Pain Assessment: 0-10 Pain Score: 4  Pain Location: back and hip Pain Descriptors / Indicators: Sore Pain Intervention(s): Monitored during session    Home Living                      Prior Function            PT Goals (current goals can now be found in the care plan section) Acute Rehab PT Goals Patient Stated Goal: to get some rehab and be independent at home PT Goal Formulation: With patient Time For Goal Achievement: 01/31/17 Potential to Achieve Goals: Good Progress towards PT goals: Progressing toward goals    Frequency    Min 5X/week      PT Plan Discharge plan needs to  be updated    Co-evaluation              AM-PAC PT "6 Clicks" Daily Activity  Outcome Measure  Difficulty turning over in bed (including adjusting bedclothes, sheets and blankets)?: Total Difficulty moving from lying on back to sitting on the side of the bed? : Total Difficulty sitting down on and standing up from a chair with arms (e.g., wheelchair, bedside commode, etc,.)?: A Little Help needed moving to and from a bed to chair (including a wheelchair)?: A Little Help needed walking in hospital room?: A  Little Help needed climbing 3-5 steps with a railing? : A Little 6 Click Score: 14    End of Session Equipment Utilized During Treatment: Gait belt;Back brace Activity Tolerance: Patient tolerated treatment well Patient left: in chair;with call bell/phone within reach;with chair alarm set Nurse Communication: Mobility status PT Visit Diagnosis: Unsteadiness on feet (R26.81);Difficulty in walking, not elsewhere classified (R26.2)     Time: 9811-9147 PT Time Calculation (min) (ACUTE ONLY): 18 min  Charges:  $Gait Training: 8-22 mins                    G Codes:       Charlotte Crumb, PT DPT NCS [phone]    Fabio Asa 01/20/2017, 2:07 PM

## 2017-03-10 ENCOUNTER — Other Ambulatory Visit: Payer: Self-pay | Admitting: Neurological Surgery

## 2017-03-10 DIAGNOSIS — R9389 Abnormal findings on diagnostic imaging of other specified body structures: Secondary | ICD-10-CM

## 2017-03-12 ENCOUNTER — Ambulatory Visit
Admission: RE | Admit: 2017-03-12 | Discharge: 2017-03-12 | Disposition: A | Discharge status: Home / Self Care | Payer: BLUE CROSS/BLUE SHIELD | Source: Ambulatory Visit | Attending: Neurological Surgery | Admitting: Neurological Surgery

## 2017-03-12 DIAGNOSIS — R9389 Abnormal findings on diagnostic imaging of other specified body structures: Secondary | ICD-10-CM

## 2017-10-07 ENCOUNTER — Emergency Department (HOSPITAL_BASED_OUTPATIENT_CLINIC_OR_DEPARTMENT_OTHER)
Admission: EM | Admit: 2017-10-07 | Discharge: 2017-10-07 | Disposition: A | Discharge status: Home / Self Care | Payer: Medicaid Other | Attending: Emergency Medicine | Admitting: Emergency Medicine

## 2017-10-07 ENCOUNTER — Other Ambulatory Visit: Payer: Self-pay

## 2017-10-07 DIAGNOSIS — B9789 Other viral agents as the cause of diseases classified elsewhere: Secondary | ICD-10-CM | POA: Diagnosis not present

## 2017-10-07 DIAGNOSIS — R0981 Nasal congestion: Secondary | ICD-10-CM | POA: Diagnosis present

## 2017-10-07 DIAGNOSIS — R070 Pain in throat: Secondary | ICD-10-CM | POA: Diagnosis not present

## 2017-10-07 DIAGNOSIS — Z7984 Long term (current) use of oral hypoglycemic drugs: Secondary | ICD-10-CM | POA: Insufficient documentation

## 2017-10-07 DIAGNOSIS — I2510 Atherosclerotic heart disease of native coronary artery without angina pectoris: Secondary | ICD-10-CM | POA: Diagnosis not present

## 2017-10-07 DIAGNOSIS — Z79899 Other long term (current) drug therapy: Secondary | ICD-10-CM | POA: Insufficient documentation

## 2017-10-07 DIAGNOSIS — E119 Type 2 diabetes mellitus without complications: Secondary | ICD-10-CM | POA: Diagnosis not present

## 2017-10-07 DIAGNOSIS — J111 Influenza due to unidentified influenza virus with other respiratory manifestations: Secondary | ICD-10-CM | POA: Diagnosis not present

## 2017-10-07 DIAGNOSIS — M791 Myalgia, unspecified site: Secondary | ICD-10-CM | POA: Diagnosis not present

## 2017-10-07 DIAGNOSIS — Z7982 Long term (current) use of aspirin: Secondary | ICD-10-CM | POA: Insufficient documentation

## 2017-10-07 DIAGNOSIS — Z7901 Long term (current) use of anticoagulants: Secondary | ICD-10-CM | POA: Insufficient documentation

## 2017-10-07 DIAGNOSIS — I10 Essential (primary) hypertension: Secondary | ICD-10-CM | POA: Diagnosis not present

## 2017-10-07 DIAGNOSIS — R509 Fever, unspecified: Secondary | ICD-10-CM | POA: Insufficient documentation

## 2017-10-07 DIAGNOSIS — R6889 Other general symptoms and signs: Secondary | ICD-10-CM

## 2017-10-07 DIAGNOSIS — Z87891 Personal history of nicotine dependence: Secondary | ICD-10-CM | POA: Insufficient documentation

## 2017-10-07 DIAGNOSIS — J069 Acute upper respiratory infection, unspecified: Secondary | ICD-10-CM | POA: Diagnosis not present

## 2017-10-07 MED ORDER — OSELTAMIVIR PHOSPHATE 75 MG PO CAPS
75.0000 mg | ORAL_CAPSULE | Freq: Two times a day (BID) | ORAL | 0 refills | Status: DC
Start: 2017-10-07 — End: 2021-07-16

## 2017-10-07 NOTE — ED Notes (Signed)
ED Provider at bedside. 

## 2017-10-07 NOTE — ED Triage Notes (Signed)
Patient reports yesterday she had onset of nasal congestion, throat pain and productive cough, associated with chills. Has been using OTC Nyquil for her symptoms

## 2017-10-07 NOTE — ED Provider Notes (Signed)
[facility] EMERGENCY DEPARTMENT Provider Note   CSN: [id number] Arrival date & time: 10/07/17  4098     History   Chief Complaint Chief Complaint  Patient presents with  . Nasal Congestion    HPI Rachael Becker is a 52 y.o. female.  Patient c/o intermittent cough, scratcy/sore throat, nasal/sinus congestion, body aches, subjective fever, for the past day. Symptoms acute onset, persistent, moderate. No definite known ill contacts. No sob. No chest pain or discomfort. Denies nvd. Normal appetite.   The history is provided by the patient.    Past Medical History:  Diagnosis Date  . Bipolar disorder (HCC)   . Depression   . Diabetes mellitus without complication (HCC)   . Dysrhythmia    a fib  . Heart disease   . History of chicken pox   . Hyperlipidemia   . Hypertension     Patient Active Problem List   Diagnosis Date Noted  . S/P lumbar spinal fusion 01/16/2017  . Elevated hemoglobin A1c 09/18/2014  . Hypovitaminosis D 09/15/2014  . Visit for preventive health examination 09/11/2014  . Fatigue due to depression 09/11/2014  . Anxiety and depression 08/28/2014  . Essential hypertension, benign 08/28/2014  . Hyperlipidemia 08/28/2014  . Coronary artery disease involving coronary bypass graft of native heart with unspecified angina pectoris 08/28/2014    Past Surgical History:  Procedure Laterality Date  . ABDOMINAL HYSTERECTOMY  2001  . BYPASS GRAFT  01.12.2016  . MULTIPLE TOOTH EXTRACTIONS     Denturees  . TONSILLECTOMY  1970  . WISDOM TOOTH EXTRACTION       OB History   None      Home Medications    Prior to Admission medications   Medication Sig Start Date End Date Taking? Authorizing Provider  ALPRAZolam Prudy Feeler) 1 MG tablet Take 1 tablet (1 mg total) by mouth 2 (two) times daily as needed for anxiety. Patient taking differently: Take 1 mg by mouth 3 (three) times daily as needed for anxiety.  09/19/14   Waldon Merl, PA-C  APPLE  CIDER VINEGAR PO Take 30 mg by mouth daily.    [provider]  aspirin 81 MG tablet Take 81 mg by mouth every evening.     [provider]  atorvastatin (LIPITOR) 80 MG tablet Take 0.5 tablets (40 mg total) by mouth daily. 08/28/14   Waldon Merl, PA-C  carvedilol (COREG) 6.25 MG tablet Take 6.25 mg by mouth 2 (two) times daily. 11/12/16   [provider]  ELIQUIS 5 MG TABS tablet Take 1 tablet (5 mg total) by mouth 2 (two) times daily. 01/23/17   Costella, Darci Current, PA-C  hydrochlorothiazide (HYDRODIURIL) 25 MG tablet Take 1 tablet (25 mg total) by mouth daily. Patient not taking: Reported on 01/05/2017 08/28/14   Waldon Merl, PA-C  HYDROcodone-acetaminophen (NORCO/VICODIN) 5-325 MG tablet Take 1-2 tablets by mouth every 6 (six) hours as needed (for pain; may cause constipation). 01/18/17   Costella, Darci Current, PA-C  lisinopril (PRINIVIL,ZESTRIL) 10 MG tablet Take 1 tablet (10 mg total) by mouth daily. Patient not taking: Reported on 01/05/2017 08/28/14   Waldon Merl, PA-C  losartan (COZAAR) 100 MG tablet Take 100 mg by mouth daily. 11/05/16   [provider]  metFORMIN (GLUCOPHAGE) 500 MG tablet Take 500 mg by mouth 2 (two) times daily with a meal.    [provider]  methocarbamol (ROBAXIN) 500 MG tablet Take 1 tablet (500 mg total) by mouth every 6 (  six) hours as needed for muscle spasms. 01/18/17   Costella, Darci Current, PA-C  omeprazole (PRILOSEC) 20 MG capsule Take 20 mg by mouth daily.    [provider]  QUEtiapine (SEROQUEL) 100 MG tablet Take 200 mg by mouth at bedtime. 11/21/16   [provider]  venlafaxine XR (EFFEXOR-XR) 75 MG 24 hr capsule Take 1 capsule (75 mg total) by mouth daily with breakfast. Patient taking differently: Take 75 mg by mouth 3 (three) times daily.  10/03/14   Waldon Merl, PA-C  Vitamin D, Ergocalciferol, (DRISDOL) 50000 UNITS CAPS capsule Take 1 capsule (50,000 Units total) by mouth every 7  (seven) days. 09/15/14   Waldon Merl, PA-C    Family History Family History  Problem Relation Age of Onset  . Stroke Father 80       Deceased  . Depression Father   . Hypertension Father   . Heart disease Father   . Hypertension Mother        Living  . Hypertension Sister   . Healthy Son        x1  . Healthy Daughter        x1    Social History Social History   Tobacco Use  . Smoking status: Former Smoker    Last attempt to quit: 07/07/2014    Years since quitting: 3.2  . Smokeless tobacco: Never Used  Substance Use Topics  . Alcohol use: Yes    Alcohol/week: 6.0 oz    Types: 10 Standard drinks or equivalent per week    Comment: 2 beers a week  . Drug use: No     Allergies   Ace inhibitors and Naprosyn [naproxen]   Review of Systems Review of Systems  Constitutional: Positive for fever.  HENT: Positive for congestion, rhinorrhea and sore throat.   Eyes: Negative for discharge and redness.  Respiratory: Positive for cough. Negative for shortness of breath.   Cardiovascular: Negative for chest pain.  Gastrointestinal: Negative for diarrhea and vomiting.  Genitourinary: Negative for dysuria.  Musculoskeletal: Negative for neck pain and neck stiffness.  Skin: Negative for rash.  Neurological: Negative for headaches.     Physical Exam Updated Vital Signs BP 140/83 (BP Location: Left Arm)   Pulse 95   Temp 98.4 F (36.9 C) (Oral)   Resp 20   Ht 1.651 m (5\' 5" )   Wt 100.7 kg (222 lb)   SpO2 98%   BMI 36.94 kg/m   Physical Exam  Constitutional: She appears well-developed and well-nourished. No distress.  HENT:  Mouth/Throat: Oropharynx is clear and moist.  Nasal congestion.   Eyes: Pupils are equal, round, and reactive to light. Conjunctivae are normal. No scleral icterus.  Neck: Neck supple. No tracheal deviation present.  No stiffness or rigidity  Cardiovascular: Normal rate, regular rhythm, normal heart sounds and intact distal pulses. Exam  reveals no gallop and no friction rub.  No murmur heard. Pulmonary/Chest: Effort normal and breath sounds normal. No respiratory distress.  Abdominal: Soft. Normal appearance and bowel sounds are normal. She exhibits no distension. There is no tenderness.  Genitourinary:  Genitourinary Comments: No cva tenderness  Musculoskeletal: She exhibits no edema.  Lymphadenopathy:    She has no cervical adenopathy.  Neurological: She is alert.  Speech clear/normal. Steady gait.   Skin: Skin is warm and dry. No rash noted. She is not diaphoretic.  Psychiatric: She has a normal mood and affect.  Nursing note and vitals reviewed.    ED  Treatments / Results  Labs (all labs ordered are listed, but only abnormal results are displayed) Labs Reviewed - No data to display  EKG None  Radiology No results found.  Procedures Procedures (including critical care time)  Medications Ordered in ED Medications - No data to display   Initial Impression / Assessment and Plan / ED Course  I have reviewed the triage vital signs and the nursing notes.  Pertinent labs & imaging results that were available during my care of the patient were reviewed by me and considered in my medical decision making (see chart for details).  Symptoms felt most c/w flu or flu-like illness.  As recent onset, will give rx tamiflu.  Chest cta, no increased wob.   Patient currently appears stable for d/c.     Final Clinical Impressions(s) / ED Diagnoses   Final diagnoses:  None    ED Discharge Orders    None       Cathren LaineSteinl, Demaria Deeney, MD 10/07/17 [phone]

## 2017-10-07 NOTE — Discharge Instructions (Addendum)
It was our pleasure to provide your ER care today - we hope that you feel better.  Take tamiflu as prescribed.  Rest, drink plenty of fluids.  You may try a multi-symptom cold/flu reliever medication such as mucinex or nyquil as need for symptom relief.   Follow up with primary care doctor in 1 week if symptoms fail to improve/resolve.  Return to ER if worse, trouble breathing, other concern.

## 2017-10-09 ENCOUNTER — Other Ambulatory Visit: Payer: Self-pay | Admitting: Neurological Surgery

## 2017-10-09 DIAGNOSIS — M48062 Spinal stenosis, lumbar region with neurogenic claudication: Secondary | ICD-10-CM

## 2017-10-19 ENCOUNTER — Other Ambulatory Visit: Payer: Self-pay

## 2017-10-22 ENCOUNTER — Ambulatory Visit
Admission: RE | Admit: 2017-10-22 | Discharge: 2017-10-22 | Disposition: A | Discharge status: Home / Self Care | Payer: Medicaid Other | Source: Ambulatory Visit | Attending: Neurological Surgery | Admitting: Neurological Surgery

## 2017-10-22 DIAGNOSIS — M48062 Spinal stenosis, lumbar region with neurogenic claudication: Secondary | ICD-10-CM

## 2018-06-26 ENCOUNTER — Emergency Department (HOSPITAL_BASED_OUTPATIENT_CLINIC_OR_DEPARTMENT_OTHER): Payer: Medicare Other

## 2018-06-26 ENCOUNTER — Encounter (HOSPITAL_BASED_OUTPATIENT_CLINIC_OR_DEPARTMENT_OTHER): Payer: Self-pay | Admitting: Emergency Medicine

## 2018-06-26 ENCOUNTER — Emergency Department (HOSPITAL_BASED_OUTPATIENT_CLINIC_OR_DEPARTMENT_OTHER)
Admission: EM | Admit: 2018-06-26 | Discharge: 2018-06-26 | Disposition: A | Discharge status: Home / Self Care | Payer: Medicare Other | Attending: Emergency Medicine | Admitting: Emergency Medicine

## 2018-06-26 ENCOUNTER — Other Ambulatory Visit: Payer: Self-pay

## 2018-06-26 DIAGNOSIS — Z7982 Long term (current) use of aspirin: Secondary | ICD-10-CM | POA: Insufficient documentation

## 2018-06-26 DIAGNOSIS — M5412 Radiculopathy, cervical region: Secondary | ICD-10-CM

## 2018-06-26 DIAGNOSIS — Z79899 Other long term (current) drug therapy: Secondary | ICD-10-CM | POA: Diagnosis not present

## 2018-06-26 DIAGNOSIS — Z87891 Personal history of nicotine dependence: Secondary | ICD-10-CM | POA: Insufficient documentation

## 2018-06-26 DIAGNOSIS — I2510 Atherosclerotic heart disease of native coronary artery without angina pectoris: Secondary | ICD-10-CM | POA: Diagnosis not present

## 2018-06-26 DIAGNOSIS — I10 Essential (primary) hypertension: Secondary | ICD-10-CM | POA: Diagnosis not present

## 2018-06-26 DIAGNOSIS — E119 Type 2 diabetes mellitus without complications: Secondary | ICD-10-CM | POA: Diagnosis not present

## 2018-06-26 DIAGNOSIS — M542 Cervicalgia: Secondary | ICD-10-CM | POA: Diagnosis present

## 2018-06-26 DIAGNOSIS — Z7984 Long term (current) use of oral hypoglycemic drugs: Secondary | ICD-10-CM | POA: Diagnosis not present

## 2018-06-26 MED ORDER — METHOCARBAMOL 500 MG PO TABS: 500.0000 mg | ORAL_TABLET | Freq: Two times a day (BID) | ORAL | 0 refills | Status: AC

## 2018-06-26 NOTE — ED Provider Notes (Signed)
[facility] EMERGENCY DEPARTMENT Provider Note   CSN: [id number] Arrival date & time: 06/26/18  1253     History   Chief Complaint Chief Complaint  Patient presents with  . Back Pain  . Neck Pain    HPI Rachael Becker is a 52 y.o. female with a past medical history of DM, CAD, HTN, who presents to ED for evaluation of neck pain and right posterior shoulder pain for the past 2 to 3 days.  States that the pain is aching, worse with movement and palpation.  She has had mild improvement with Goody powders and Tylenol that she has been taking.  She cannot recall any inciting event that may have triggered her symptoms.  She does note a history of several lumbar surgeries but no neck surgeries in the past.  Denies any injuries or falls, chest pain, shortness of breath, numbness in arms or legs, hemoptysis, fever.  HPI  Past Medical History:  Diagnosis Date  . Bipolar disorder (HCC)   . Depression   . Diabetes mellitus without complication (HCC)   . Dysrhythmia    a fib  . Heart disease   . History of chicken pox   . Hyperlipidemia   . Hypertension     Patient Active Problem List   Diagnosis Date Noted  . S/P lumbar spinal fusion 01/16/2017  . Elevated hemoglobin A1c 09/18/2014  . Hypovitaminosis D 09/15/2014  . Visit for preventive health examination 09/11/2014  . Fatigue due to depression 09/11/2014  . Anxiety and depression 08/28/2014  . Essential hypertension, benign 08/28/2014  . Hyperlipidemia 08/28/2014  . Coronary artery disease involving coronary bypass graft of native heart with unspecified angina pectoris 08/28/2014    Past Surgical History:  Procedure Laterality Date  . ABDOMINAL HYSTERECTOMY  2001  . BYPASS GRAFT  01.12.2016  . MULTIPLE TOOTH EXTRACTIONS     Denturees  . TONSILLECTOMY  1970  . WISDOM TOOTH EXTRACTION       OB History   No obstetric history on file.      Home Medications    Prior to Admission medications   Medication  Sig Start Date End Date Taking? Authorizing Provider  atorvastatin (LIPITOR) 80 MG tablet Take 0.5 tablets (40 mg total) by mouth daily. 08/28/14  Yes Waldon MerlMartin, William C, PA-C  carvedilol (COREG) 6.25 MG tablet Take 6.25 mg by mouth 2 (two) times daily. 11/12/16  Yes [provider]  ELIQUIS 5 MG TABS tablet Take 1 tablet (5 mg total) by mouth 2 (two) times daily. 01/23/17  Yes Costella, Darci CurrentVincent J, PA-C  QUEtiapine (SEROQUEL) 100 MG tablet Take 200 mg by mouth at bedtime. 11/21/16  Yes [provider]  ALPRAZolam Prudy Feeler(XANAX) 1 MG tablet Take 1 tablet (1 mg total) by mouth 2 (two) times daily as needed for anxiety. Patient taking differently: Take 1 mg by mouth 3 (three) times daily as needed for anxiety.  09/19/14   Waldon MerlMartin, William C, PA-C  APPLE CIDER VINEGAR PO Take 30 mg by mouth daily.    [provider]  aspirin 81 MG tablet Take 81 mg by mouth every evening.     [provider]  hydrochlorothiazide (HYDRODIURIL) 25 MG tablet Take 1 tablet (25 mg total) by mouth daily. Patient not taking: Reported on 01/05/2017 08/28/14   Waldon MerlMartin, William C, PA-C  HYDROcodone-acetaminophen (NORCO/VICODIN) 5-325 MG tablet Take 1-2 tablets by mouth every 6 (six) hours as needed (for pain; may cause constipation). 01/18/17   Costella, Darci CurrentVincent J, PA-C  lisinopril (PRINIVIL,ZESTRIL) 10 MG tablet Take 1 tablet (10 mg total) by mouth daily. Patient not taking: Reported on 01/05/2017 08/28/14   Waldon Merl, PA-C  losartan (COZAAR) 100 MG tablet Take 100 mg by mouth daily. 11/05/16   [provider]  metFORMIN (GLUCOPHAGE) 500 MG tablet Take 500 mg by mouth 2 (two) times daily with a meal.    [provider]  methocarbamol (ROBAXIN) 500 MG tablet Take 1 tablet (500 mg total) by mouth 2 (two) times daily. 06/26/18   Kavonte Bearse, PA-C  omeprazole (PRILOSEC) 20 MG capsule Take 20 mg by mouth daily.    [provider]  oseltamivir (TAMIFLU) 75 MG capsule Take 1 capsule (75  mg total) by mouth every 12 (twelve) hours. 10/07/17   Cathren Laine, MD  venlafaxine XR (EFFEXOR-XR) 75 MG 24 hr capsule Take 1 capsule (75 mg total) by mouth daily with breakfast. Patient taking differently: Take 75 mg by mouth 3 (three) times daily.  10/03/14   Waldon Merl, PA-C  Vitamin D, Ergocalciferol, (DRISDOL) 50000 UNITS CAPS capsule Take 1 capsule (50,000 Units total) by mouth every 7 (seven) days. 09/15/14   Waldon Merl, PA-C    Family History Family History  Problem Relation Age of Onset  . Stroke Father 72       Deceased  . Depression Father   . Hypertension Father   . Heart disease Father   . Hypertension Mother        Living  . Hypertension Sister   . Healthy Son        x1  . Healthy Daughter        x1    Social History Social History   Tobacco Use  . Smoking status: Former Smoker    Last attempt to quit: 07/07/2014    Years since quitting: 3.9  . Smokeless tobacco: Never Used  Substance Use Topics  . Alcohol use: Yes    Alcohol/week: 10.0 standard drinks    Types: 10 Standard drinks or equivalent per week    Comment: 2 beers a week  . Drug use: No     Allergies   Ace inhibitors and Naprosyn [naproxen]   Review of Systems Review of Systems  Constitutional: Negative for chills and fever.  Cardiovascular: Negative for chest pain.  Gastrointestinal: Negative for nausea and vomiting.  Musculoskeletal: Positive for myalgias and neck pain.  Neurological: Negative for weakness, numbness and headaches.     Physical Exam Updated Vital Signs BP (!) 183/116 (BP Location: Right Arm)   Pulse 87   Temp 99.8 F (37.7 C) (Oral)   Resp (!) 24   Ht 5\' 5"  (1.651 m)   Wt 99.8 kg   SpO2 100%   BMI 36.61 kg/m   Physical Exam Vitals signs and nursing note reviewed.  Constitutional:      General: She is not in acute distress.    Appearance: She is well-developed. She is not diaphoretic.  HENT:     Head: Normocephalic and atraumatic.  Eyes:      General: No scleral icterus.    Conjunctiva/sclera: Conjunctivae normal.  Neck:     Musculoskeletal: Normal range of motion. Spinous process tenderness and muscular tenderness present.      Comments: Full active and passive range of motion of neck without difficulty. No meningismus. Cardiovascular:     Rate and Rhythm: Normal rate and regular rhythm.  Pulmonary:     Effort: Pulmonary effort is normal. No respiratory distress.  Breath sounds: Normal breath sounds.  Skin:    Findings: No rash.  Neurological:     Mental Status: She is alert.      ED Treatments / Results  Labs (all labs ordered are listed, but only abnormal results are displayed) Labs Reviewed - No data to display  EKG EKG Interpretation  Date/Time:  Saturday June 26 2018 13:38:54 EST Ventricular Rate:  82 PR Interval:    QRS Duration: 100 QT Interval:  398 QTC Calculation: 465 R Axis:   57 Text Interpretation:  Sinus rhythm Anterior infarct, old Minimal ST elevation, inferior leads Baseline wander in lead(s) V6 No significant change since last tracing Confirmed by Shaune Pollack [id number]) on 06/26/2018 2:56:21 PM   Radiology Ct Cervical Spine Wo Contrast  Result Date: 06/26/2018 CLINICAL DATA:  Right neck pain radiating into the right shoulder EXAM: CT CERVICAL SPINE WITHOUT CONTRAST TECHNIQUE: Multidetector CT imaging of the cervical spine was performed without intravenous contrast. Multiplanar CT image reconstructions were also generated. COMPARISON:  None. FINDINGS: Alignment: No vertebral subluxation is observed. Skull base and vertebrae: No fracture or acute bony findings. Endplate sclerosis and degenerative disc disease at C5-6 and C6-7 with associated vertebral sclerosis. Soft tissues and spinal canal: Atherosclerotic calcification of the cavernous carotid arteries, vertebral arteries, and carotid bulbs. Aortic arch atherosclerotic calcification. Disc levels: Moderate bilateral foraminal impingement  at C5-6 due to uncinate spurring. Mild bilateral foraminal impingement at C6-7 due to uncinate spurring. Upper chest: Unremarkable Other: No supplemental non-categorized findings. IMPRESSION: 1. Foraminal impingement at C5-6 and C6-7 due to uncinate spurring. There is degenerative loss of disc height and endplate sclerosis at both of these levels. 2. Atherosclerosis. Electronically Signed   By: Gaylyn Rong M.D.   On: 06/26/2018 14:20    Procedures Procedures (including critical care time)  Medications Ordered in ED Medications - No data to display   Initial Impression / Assessment and Plan / ED Course  I have reviewed the triage vital signs and the nursing notes.  Pertinent labs & imaging results that were available during my care of the patient were reviewed by me and considered in my medical decision making (see chart for details).     52 year old female with a past medical history of diabetes, hypertension presents to ED for right-sided neck pain and posterior shoulder pain for the past 2 to 3 days.  Pain is worse with movement and palpation.  Mild improvement noted with over-the-counter medications.  Denies any lower back pain or neurological symptoms.  Denies chest pain, shortness of breath or hemoptysis.  On exam she is overall well-appearing.  There is tenderness to palpation of the paraspinal musculature of the right side cervical spine.  No deficits on neurological exam noted.  Denies any trauma to the area.  CT shows chronic findings of spurring and impingement at several disc levels.  No acute abnormalities noted.  EKG shows no changes from prior tracings.  Suspect that symptoms could be radicular and musculoskeletal in nature.  Will give muscle relaxer and have her follow-up with her neurosurgeon for further management and imaging.  I doubt cardiac or neurological cause of her symptoms.  Will advise her to return to ED for any severe worsening symptoms.  Patient is  hemodynamically stable, in NAD, and able to ambulate in the ED. Evaluation does not show pathology that would require ongoing emergent intervention or inpatient treatment. I explained the diagnosis to the patient. Pain has been managed and has no complaints prior to discharge.  Patient is comfortable with above plan and is stable for discharge at this time. All questions were answered prior to disposition. Strict return precautions for returning to the ED were discussed. Encouraged follow up with PCP.    Portions of this note were generated with Scientist, clinical (histocompatibility and immunogenetics)Dragon dictation software. Dictation errors may occur despite best attempts at proofreading.   Final Clinical Impressions(s) / ED Diagnoses   Final diagnoses:  Cervical radiculopathy    ED Discharge Orders         Ordered    methocarbamol (ROBAXIN) 500 MG tablet  2 times daily     06/26/18 1459           Dietrich PatesKhatri, Azaliyah Kennard, PA-C 06/26/18 1503    Shaune PollackIsaacs, Cameron, MD 06/26/18 1944

## 2018-06-26 NOTE — ED Notes (Signed)
Aromatherapy study initiated.

## 2018-06-26 NOTE — ED Triage Notes (Signed)
Pt c/o neck pain and upper back pain x 2 days. Pt states she has been unable to sleep because of it.

## 2018-06-26 NOTE — Discharge Instructions (Signed)
Take the muscle relaxer as directed. Continue your other home medications as previously prescribed.

## 2018-06-26 NOTE — ED Notes (Signed)
Pt on cardiac monitor and auto VS 

## 2019-02-27 IMAGING — CT CT CERVICAL SPINE W/O CM
3 of 4 series · 12 of 33 positions shown, 14 images · non-contrast
Comparison: None.

CLINICAL DATA: Right neck pain radiating into the right shoulder

EXAM:
CT CERVICAL SPINE WITHOUT CONTRAST
TECHNIQUE: Multidetector CT imaging of the cervical spine was performed without
intravenous contrast. Multiplanar CT image reconstructions were also
generated.

[Series 5: sagittal bone · sagittal · 0.27mm/px · 5 of 61 slices shown, 6 images]
[im 21/61  bone]
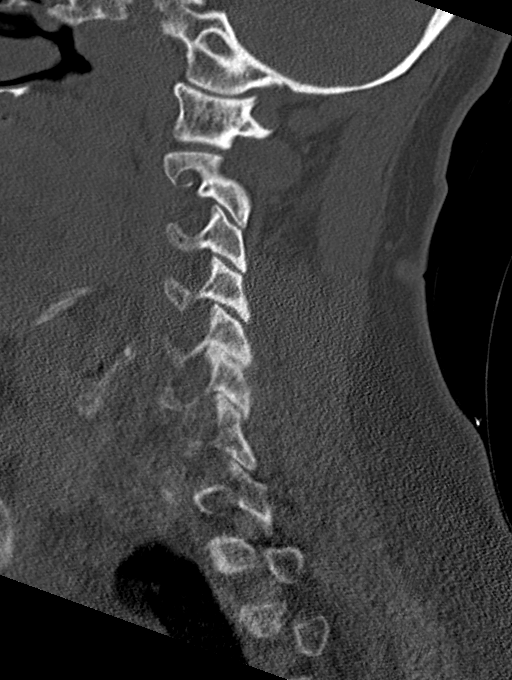
[im 26/61  bone]
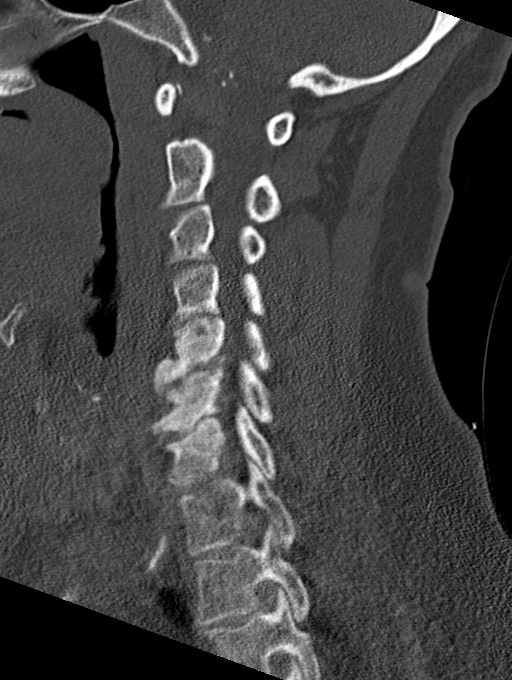
[im 31/61  soft-tissue]
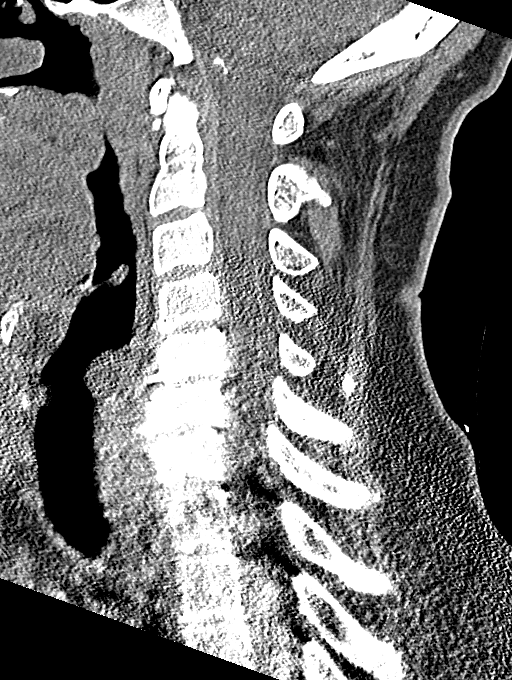
[im 31/61  bone]
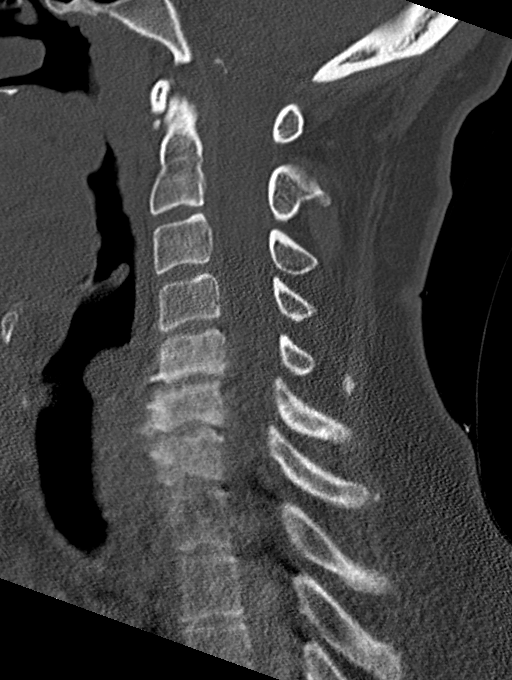
[im 36/61  bone]
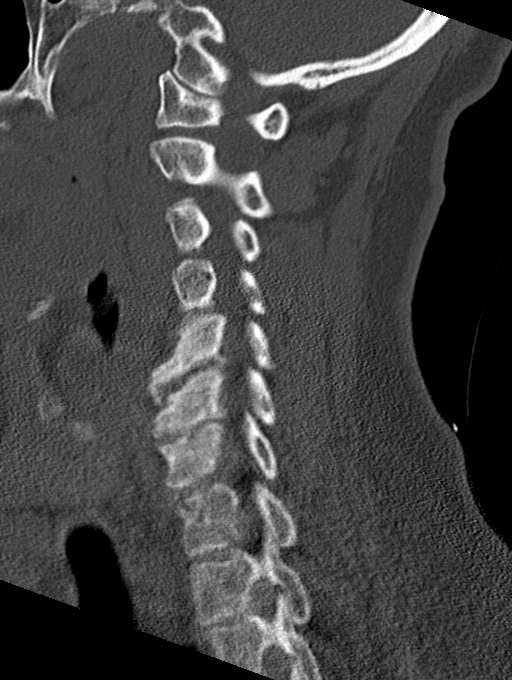
[im 41/61  bone]
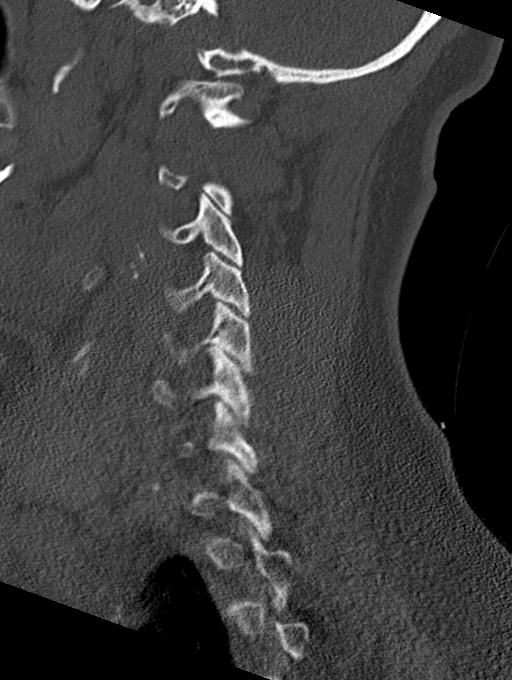

[Series 6: coronal bone · coronal · 0.27mm/px · 3 of 59 slices shown]
[im 12/59  bone]
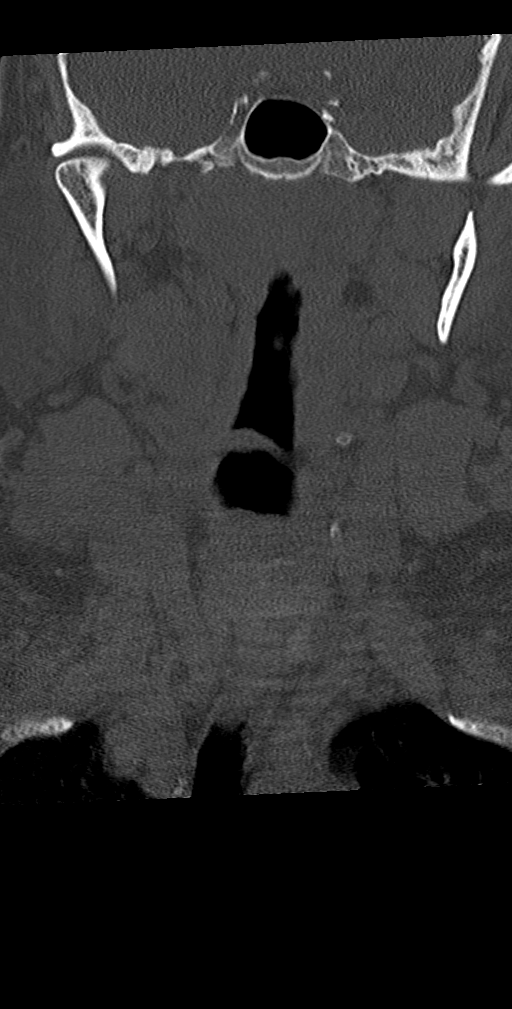
[im 24/59  bone]
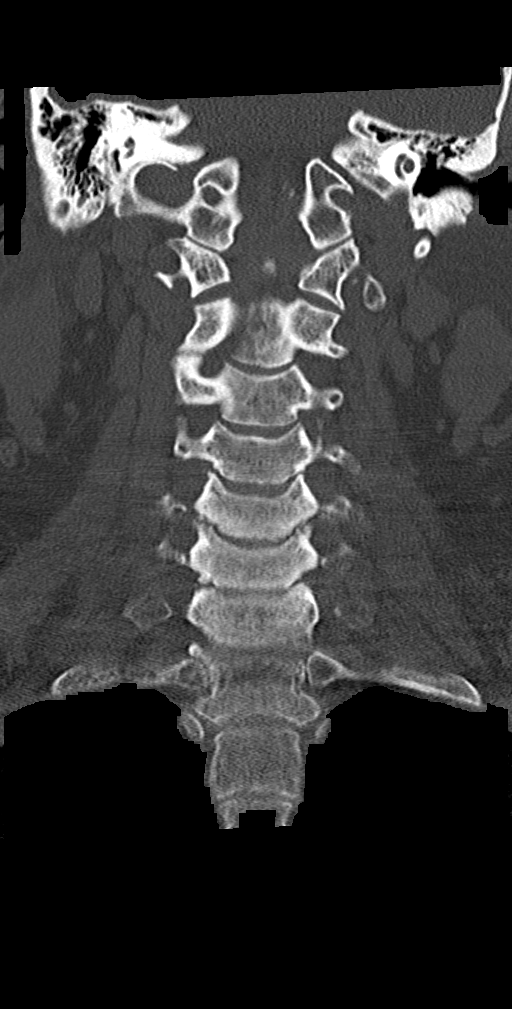
[im 35/59  bone]
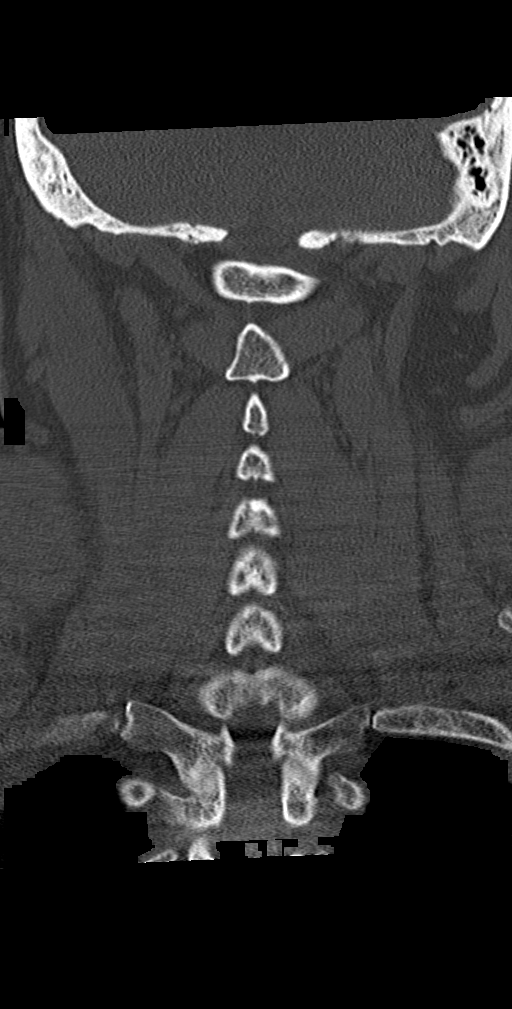

[Series 7: orthogonal bone · axial · 0.29mm/px · z∈[-215,-110]mm · 4 of 85 slices shown, 5 images]
[im 15/85  soft-tissue]
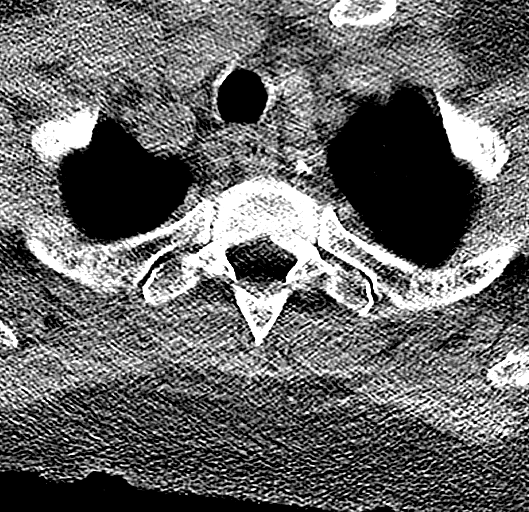
[im 15/85  bone]
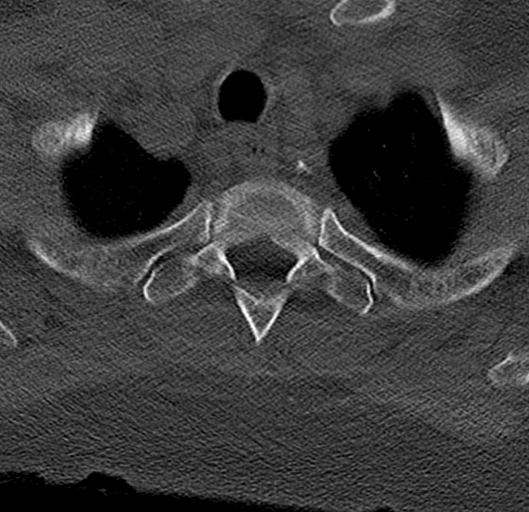
[im 29/85  bone]
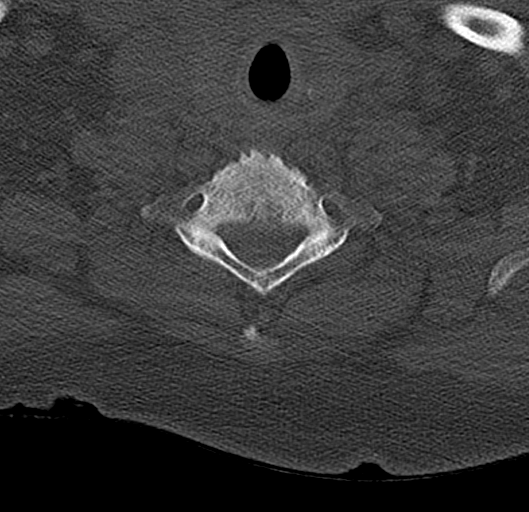
[im 57/85  bone]
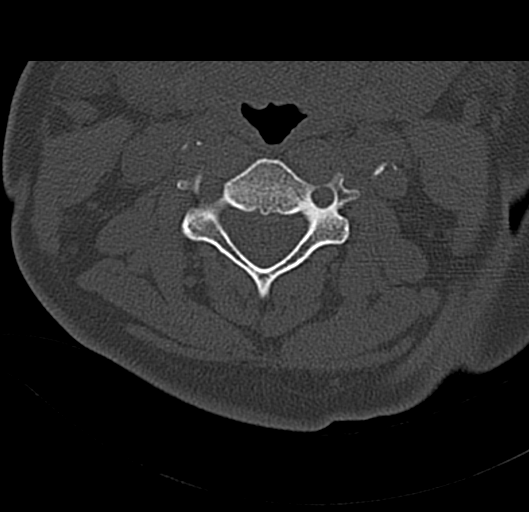
[im 71/85  bone]
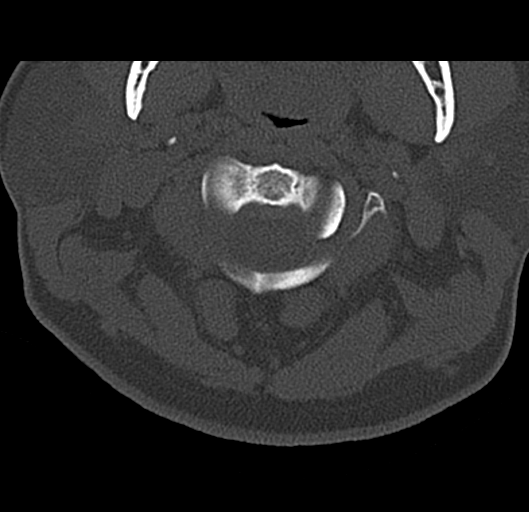

[12 of 33 positions shown; findings below may reference images not displayed]

FINDINGS: Alignment: No vertebral subluxation is observed.

Skull base and vertebrae: No fracture or acute bony findings.
Endplate sclerosis and degenerative disc disease at C5-6 and C6-7
with associated vertebral sclerosis.

Soft tissues and spinal canal: Atherosclerotic calcification of the
cavernous carotid arteries, vertebral arteries, and carotid bulbs.
Aortic arch atherosclerotic calcification.

Disc levels: Moderate bilateral foraminal impingement at C5-6 due to
uncinate spurring. Mild bilateral foraminal impingement at C6-7 due
to uncinate spurring.

Upper chest: Unremarkable

Other: No supplemental non-categorized findings.
IMPRESSION: 1. Foraminal impingement at C5-6 and C6-7 due to uncinate spurring.
There is degenerative loss of disc height and endplate sclerosis at
both of these levels.
2. Atherosclerosis.

## 2021-05-09 ENCOUNTER — Encounter (HOSPITAL_BASED_OUTPATIENT_CLINIC_OR_DEPARTMENT_OTHER): Payer: Self-pay

## 2021-05-09 ENCOUNTER — Other Ambulatory Visit: Payer: Self-pay

## 2021-05-09 ENCOUNTER — Emergency Department (HOSPITAL_BASED_OUTPATIENT_CLINIC_OR_DEPARTMENT_OTHER)
Admission: EM | Admit: 2021-05-09 | Discharge: 2021-05-09 | Disposition: A | Discharge status: Home / Self Care | Payer: Medicare Other | Attending: Emergency Medicine | Admitting: Emergency Medicine

## 2021-05-09 DIAGNOSIS — J0100 Acute maxillary sinusitis, unspecified: Secondary | ICD-10-CM | POA: Diagnosis not present

## 2021-05-09 DIAGNOSIS — Z79899 Other long term (current) drug therapy: Secondary | ICD-10-CM | POA: Insufficient documentation

## 2021-05-09 DIAGNOSIS — I2510 Atherosclerotic heart disease of native coronary artery without angina pectoris: Secondary | ICD-10-CM | POA: Insufficient documentation

## 2021-05-09 DIAGNOSIS — Z7901 Long term (current) use of anticoagulants: Secondary | ICD-10-CM | POA: Insufficient documentation

## 2021-05-09 DIAGNOSIS — Z951 Presence of aortocoronary bypass graft: Secondary | ICD-10-CM | POA: Diagnosis not present

## 2021-05-09 DIAGNOSIS — R110 Nausea: Secondary | ICD-10-CM | POA: Diagnosis not present

## 2021-05-09 DIAGNOSIS — Z20822 Contact with and (suspected) exposure to covid-19: Secondary | ICD-10-CM | POA: Insufficient documentation

## 2021-05-09 DIAGNOSIS — J069 Acute upper respiratory infection, unspecified: Secondary | ICD-10-CM | POA: Insufficient documentation

## 2021-05-09 DIAGNOSIS — Z7984 Long term (current) use of oral hypoglycemic drugs: Secondary | ICD-10-CM | POA: Insufficient documentation

## 2021-05-09 DIAGNOSIS — Z87891 Personal history of nicotine dependence: Secondary | ICD-10-CM | POA: Insufficient documentation

## 2021-05-09 DIAGNOSIS — E119 Type 2 diabetes mellitus without complications: Secondary | ICD-10-CM | POA: Diagnosis not present

## 2021-05-09 DIAGNOSIS — J3489 Other specified disorders of nose and nasal sinuses: Secondary | ICD-10-CM | POA: Diagnosis not present

## 2021-05-09 DIAGNOSIS — I10 Essential (primary) hypertension: Secondary | ICD-10-CM | POA: Insufficient documentation

## 2021-05-09 DIAGNOSIS — R059 Cough, unspecified: Secondary | ICD-10-CM | POA: Diagnosis present

## 2021-05-09 LAB — RESP PANEL BY RT-PCR (FLU A&B, COVID) ARPGX2
Influenza A by PCR: NEGATIVE
Influenza B by PCR: NEGATIVE
SARS Coronavirus 2 by RT PCR: NEGATIVE

## 2021-05-09 MED ORDER — ONDANSETRON 4 MG PO TBDP: 4.0000 mg | ORAL_TABLET | Freq: Three times a day (TID) | ORAL | 0 refills | Status: DC | PRN

## 2021-05-09 MED ORDER — AMOXICILLIN-POT CLAVULANATE 875-125 MG PO TABS: 1.0000 | ORAL_TABLET | Freq: Two times a day (BID) | ORAL | 0 refills | Status: AC

## 2021-05-09 NOTE — ED Triage Notes (Signed)
Pt c/o flu like sx x 1 week-NAD-to triage in w/c

## 2021-05-09 NOTE — ED Provider Notes (Signed)
[facility] EMERGENCY DEPARTMENT Provider Note   CSN: [id number] Arrival date & time: 05/09/21  1708     History Chief Complaint  Patient presents with   Cough    Rachael Becker is a 55 y.o. female.  HPI Patient is a 55 year old female with a history of diabetes mellitus, hyperlipidemia, hypertension, who presents to the emergency department due to productive cough, rhinorrhea, congestion, headaches, sore throat, malaise.  Symptoms started about 1 week ago and have been constant.  States that she is producing green phlegm with her cough and also reports green rhinorrhea.  No chest pain or shortness of breath.  Patient notes mild nausea but no vomiting or diarrhea.  No abdominal pain.    Past Medical History:  Diagnosis Date   Bipolar disorder (HCC)    Depression    Diabetes mellitus without complication (HCC)    Dysrhythmia    a fib   Heart disease    History of chicken pox    Hyperlipidemia    Hypertension     Patient Active Problem List   Diagnosis Date Noted   S/P lumbar spinal fusion 01/16/2017   Elevated hemoglobin A1c 09/18/2014   Hypovitaminosis D 09/15/2014   Visit for preventive health examination 09/11/2014   Fatigue due to depression 09/11/2014   Anxiety and depression 08/28/2014   Essential hypertension, benign 08/28/2014   Hyperlipidemia 08/28/2014   Coronary artery disease involving coronary bypass graft of native heart with unspecified angina pectoris 08/28/2014    Past Surgical History:  Procedure Laterality Date   ABDOMINAL HYSTERECTOMY  2001   BYPASS GRAFT  01.12.2016   MULTIPLE TOOTH EXTRACTIONS     Denturees   TONSILLECTOMY  1970   WISDOM TOOTH EXTRACTION       OB History   No obstetric history on file.     Family History  Problem Relation Age of Onset   Stroke Father 51       Deceased   Depression Father    Hypertension Father    Heart disease Father    Hypertension Mother        Living   Hypertension Sister     Healthy Son        x1   Healthy Daughter        x1    Social History   Tobacco Use   Smoking status: Former    Types: Cigarettes    Quit date: 07/07/2014    Years since quitting: 6.8   Smokeless tobacco: Never  Vaping Use   Vaping Use: Never used  Substance Use Topics   Alcohol use: Yes    Alcohol/week: 10.0 standard drinks    Types: 10 Standard drinks or equivalent per week    Comment: occ   Drug use: No    Home Medications Prior to Admission medications   Medication Sig Start Date End Date Taking? Authorizing Provider  amoxicillin-clavulanate (AUGMENTIN) 875-125 MG tablet Take 1 tablet by mouth every 12 (twelve) hours for 10 days. 05/09/21 05/19/21 Yes Placido Sou, PA-C  ondansetron (ZOFRAN ODT) 4 MG disintegrating tablet Take 1 tablet (4 mg total) by mouth every 8 (eight) hours as needed for nausea or vomiting. 05/09/21  Yes Placido Sou, PA-C  ALPRAZolam (XANAX) 1 MG tablet Take 1 tablet (1 mg total) by mouth 2 (two) times daily as needed for anxiety. Patient taking differently: Take 1 mg by mouth 3 (three) times daily as needed for anxiety.  09/19/14   Waldon Merl, PA-C  APPLE CIDER VINEGAR PO Take 30 mg by mouth daily.    [provider]  aspirin 81 MG tablet Take 81 mg by mouth every evening.     [provider]  atorvastatin (LIPITOR) 80 MG tablet Take 0.5 tablets (40 mg total) by mouth daily. 08/28/14   Waldon Merl, PA-C  carvedilol (COREG) 6.25 MG tablet Take 6.25 mg by mouth 2 (two) times daily. 11/12/16   [provider]  ELIQUIS 5 MG TABS tablet Take 1 tablet (5 mg total) by mouth 2 (two) times daily. 01/23/17   Costella, Darci Current, PA-C  hydrochlorothiazide (HYDRODIURIL) 25 MG tablet Take 1 tablet (25 mg total) by mouth daily. Patient not taking: Reported on 01/05/2017 08/28/14   Waldon Merl, PA-C  HYDROcodone-acetaminophen (NORCO/VICODIN) 5-325 MG tablet Take 1-2 tablets by mouth every 6 (six) hours as needed (for pain;  may cause constipation). 01/18/17   Costella, Darci Current, PA-C  lisinopril (PRINIVIL,ZESTRIL) 10 MG tablet Take 1 tablet (10 mg total) by mouth daily. Patient not taking: Reported on 01/05/2017 08/28/14   Waldon Merl, PA-C  losartan (COZAAR) 100 MG tablet Take 100 mg by mouth daily. 11/05/16   [provider]  metFORMIN (GLUCOPHAGE) 500 MG tablet Take 500 mg by mouth 2 (two) times daily with a meal.    [provider]  methocarbamol (ROBAXIN) 500 MG tablet Take 1 tablet (500 mg total) by mouth 2 (two) times daily. 06/26/18   Khatri, Hina, PA-C  omeprazole (PRILOSEC) 20 MG capsule Take 20 mg by mouth daily.    [provider]  oseltamivir (TAMIFLU) 75 MG capsule Take 1 capsule (75 mg total) by mouth every 12 (twelve) hours. 10/07/17   Cathren Laine, MD  QUEtiapine (SEROQUEL) 100 MG tablet Take 200 mg by mouth at bedtime. 11/21/16   [provider]  venlafaxine XR (EFFEXOR-XR) 75 MG 24 hr capsule Take 1 capsule (75 mg total) by mouth daily with breakfast. Patient taking differently: Take 75 mg by mouth 3 (three) times daily.  10/03/14   Waldon Merl, PA-C  Vitamin D, Ergocalciferol, (DRISDOL) 50000 UNITS CAPS capsule Take 1 capsule (50,000 Units total) by mouth every 7 (seven) days. 09/15/14   Waldon Merl, PA-C    Allergies    Ace inhibitors and Naprosyn [naproxen]  Review of Systems   Review of Systems  All other systems reviewed and are negative. Ten systems reviewed and are negative for acute change, except as noted in the HPI.   Physical Exam Updated Vital Signs BP (!) 159/98 (BP Location: Right Arm)   Pulse 88   Temp 98.9 F (37.2 C) (Oral)   Resp 18   Ht 5\' 5"  (1.651 m)   Wt 93 kg   SpO2 95%   BMI 34.11 kg/m   Physical Exam Vitals and nursing note reviewed.  Constitutional:      General: She is not in acute distress.    Appearance: Normal appearance. She is not ill-appearing, toxic-appearing or diaphoretic.  HENT:     Head:  Normocephalic and atraumatic.     Right Ear: Tympanic membrane, ear canal and external ear normal. There is no impacted cerumen.     Left Ear: Tympanic membrane, ear canal and external ear normal. There is no impacted cerumen.     Ears:     Comments: Bilateral ears, EACs, and TMs appear normal.    Nose: Congestion and rhinorrhea present.     Comments: Mild tenderness noted along the left  maxillary sinus.    Mouth/Throat:     Mouth: Mucous membranes are moist.     Pharynx: Oropharynx is clear. No oropharyngeal exudate or posterior oropharyngeal erythema.     Comments: Uvula midline.  No significant erythema noted in the posterior oropharynx.  No exudates.  Readily handling secretions.  No hot potato voice. Eyes:     General: No scleral icterus.       Right eye: No discharge.        Left eye: No discharge.     Extraocular Movements: Extraocular movements intact.     Conjunctiva/sclera: Conjunctivae normal.  Cardiovascular:     Rate and Rhythm: Normal rate and regular rhythm.     Pulses: Normal pulses.     Heart sounds: Normal heart sounds. No murmur heard.   No friction rub. No gallop.     Comments: RRR without M/R/G. Pulmonary:     Effort: Pulmonary effort is normal. No respiratory distress.     Breath sounds: Normal breath sounds. No stridor. No wheezing, rhonchi or rales.     Comments: LCTAB without wheezing, rales or rhonchi. Abdominal:     General: Abdomen is flat.     Palpations: Abdomen is soft.     Tenderness: There is no abdominal tenderness.  Musculoskeletal:        General: Normal range of motion.     Cervical back: Normal range of motion and neck supple. No tenderness.  Skin:    General: Skin is warm and dry.  Neurological:     General: No focal deficit present.     Mental Status: She is alert and oriented to person, place, and time.  Psychiatric:        Mood and Affect: Mood normal.        Behavior: Behavior normal.   ED Results / Procedures / Treatments    Labs (all labs ordered are listed, but only abnormal results are displayed) Labs Reviewed  RESP PANEL BY RT-PCR (FLU A&B, COVID) [id number]   EKG None  Radiology No results found.  Procedures Procedures   Medications Ordered in ED Medications - No data to display  ED Course  I have reviewed the triage vital signs and the nursing notes.  Pertinent labs & imaging results that were available during my care of the patient were reviewed by me and considered in my medical decision making (see chart for details).    MDM Rules/Calculators/A&P                          Patient is a 55 year old female who presents to the emergency department with symptoms consistent with a viral URI.  Physical exam is generally reassuring.  Heart is regular rate and rhythm.  Lungs are clear to auscultation bilaterally.  Abdomen is flat, soft, and nontender.  Uvula midline with no erythema or exudates noted in the posterior oropharynx.  Patient did have some mild tenderness along the left maxillary sinus.  She reports worsening productive cough with green sputum as well as green rhinorrhea.  Will discharge on a course of Augmentin for possible developing bacterial sinusitis.  We will also discharge on a course of Zofran for nausea.  Patient denies any vomiting.  We discussed return precautions.  Feel that she is stable for discharge at this time.  Her questions were answered and she was amicable at the time of discharge.  Final Clinical Impression(s) / ED Diagnoses Final diagnoses:  Viral URI with  cough  Acute maxillary sinusitis, recurrence not specified   Rx / DC Orders ED Discharge Orders          Ordered    amoxicillin-clavulanate (AUGMENTIN) 875-125 MG tablet  Every 12 hours        05/09/21 2143    ondansetron (ZOFRAN ODT) 4 MG disintegrating tablet  Every 8 hours PRN        05/09/21 2143             Placido Sou, PA-C 05/09/21 2150    Arby Barrette, MD 05/14/21 1108

## 2021-05-09 NOTE — Discharge Instructions (Addendum)
I recommend that you begin using Flonase.  This is also known as fluticasone.  This can be purchased over-the-counter.  Please squirt into each nostril twice per day.  This will help with your sinus pressure and congestion.  I am prescribing you 2 additional medications.  The first medication is called Augmentin.  This is a strong antibiotic.  I want you to take this twice a day for the next 10 days.  Do not stop taking this antibiotic early.  I am also prescribing you a medication called Zofran.  This is a disintegrating tablet you can use up to 3 times a day for management of your nausea and vomiting.  Please only take this as prescribed.  Please only take this if you are experiencing nausea and vomiting that you cannot control.  If you develop any new or worsening symptoms please come back to the emergency department.  It was a pleasure to meet you.

## 2021-06-13 ENCOUNTER — Emergency Department (HOSPITAL_BASED_OUTPATIENT_CLINIC_OR_DEPARTMENT_OTHER)
Admission: EM | Admit: 2021-06-13 | Discharge: 2021-06-13 | Disposition: A | Discharge status: Home / Self Care | Payer: Medicare Other | Attending: Emergency Medicine | Admitting: Emergency Medicine

## 2021-06-13 ENCOUNTER — Encounter (HOSPITAL_BASED_OUTPATIENT_CLINIC_OR_DEPARTMENT_OTHER): Payer: Self-pay | Admitting: *Deleted

## 2021-06-13 ENCOUNTER — Other Ambulatory Visit: Payer: Self-pay

## 2021-06-13 ENCOUNTER — Emergency Department (HOSPITAL_BASED_OUTPATIENT_CLINIC_OR_DEPARTMENT_OTHER): Payer: Medicare Other

## 2021-06-13 DIAGNOSIS — Z79899 Other long term (current) drug therapy: Secondary | ICD-10-CM | POA: Insufficient documentation

## 2021-06-13 DIAGNOSIS — I10 Essential (primary) hypertension: Secondary | ICD-10-CM | POA: Insufficient documentation

## 2021-06-13 DIAGNOSIS — Z7984 Long term (current) use of oral hypoglycemic drugs: Secondary | ICD-10-CM | POA: Insufficient documentation

## 2021-06-13 DIAGNOSIS — Z87891 Personal history of nicotine dependence: Secondary | ICD-10-CM | POA: Diagnosis not present

## 2021-06-13 DIAGNOSIS — Z7982 Long term (current) use of aspirin: Secondary | ICD-10-CM | POA: Insufficient documentation

## 2021-06-13 DIAGNOSIS — R0789 Other chest pain: Secondary | ICD-10-CM | POA: Insufficient documentation

## 2021-06-13 DIAGNOSIS — R079 Chest pain, unspecified: Secondary | ICD-10-CM

## 2021-06-13 DIAGNOSIS — Z951 Presence of aortocoronary bypass graft: Secondary | ICD-10-CM | POA: Insufficient documentation

## 2021-06-13 DIAGNOSIS — E119 Type 2 diabetes mellitus without complications: Secondary | ICD-10-CM | POA: Insufficient documentation

## 2021-06-13 DIAGNOSIS — I2510 Atherosclerotic heart disease of native coronary artery without angina pectoris: Secondary | ICD-10-CM | POA: Diagnosis not present

## 2021-06-13 LAB — TROPONIN I (HIGH SENSITIVITY)
Troponin I (High Sensitivity): 6 ng/L (ref <18)
Troponin I (High Sensitivity): 7 ng/L (ref <18)

## 2021-06-13 LAB — BASIC METABOLIC PANEL
Anion gap: 8 (ref 5–15)
BUN: 14 mg/dL (ref 6–20)
CO2: 25 mmol/L (ref 22–32)
Calcium: 9 mg/dL (ref 8.9–10.3)
Chloride: 107 mmol/L (ref 98–111)
Creatinine, Ser: 0.75 mg/dL (ref 0.44–1.00)
GFR, Estimated: >60 mL/min (ref >60)
Glucose, Bld: 220 mg/dL — ABNORMAL HIGH (ref 70–99)
Potassium: 4.4 mmol/L (ref 3.5–5.1)
Sodium: 140 mmol/L (ref 135–145)

## 2021-06-13 LAB — CBC
HCT: 44.7 % (ref 36.0–46.0)
Hemoglobin: 13.9 g/dL (ref 12.0–15.0)
MCH: 26.9 pg (ref 26.0–34.0)
MCHC: 31.1 g/dL (ref 30.0–36.0)
MCV: 86.5 fL (ref 80.0–100.0)
Platelets: 224 10*3/uL (ref 150–400)
RBC: 5.17 MIL/uL — ABNORMAL HIGH (ref 3.87–5.11)
RDW: 15.8 % — ABNORMAL HIGH (ref 11.5–15.5)
WBC: 8.8 10*3/uL (ref 4.0–10.5)
nRBC: 0 % (ref 0.0–0.2)

## 2021-06-13 NOTE — ED Provider Notes (Signed)
[place] EMERGENCY DEPARTMENT Provider Note   CSN: [id number] Arrival date & time: 06/13/21  1915     History Chief Complaint  Patient presents with   Chest Pain    Rachael Becker is a 55 y.o. female.  Patient is a 55 year old female with a history of diabetes, bipolar disorder, hypertension, hyperlipidemia and coronary artery disease with bypass surgery in 2016.  She states that this was done at [facility] and she is currently followed by cardiologist at [facility].  She comes in with chest pain.  She had an episode of chest pain last night.  She was laying in bed when it happened.  It was in the center of her chest and radiated a little bit to her right arm.  She said it lasted about an hour.  It did not go away with nitroglycerin.  She said she had another episode today about 2 and half hours before she came into the ED.  She again said it was in the center of her chest.  It only lasted a couple of minutes.  It was nonexertional.  She had some mild shortness of breath with the pain but none currently.  No nausea or vomiting.  She got hot but no definite diaphoresis.  She has been pain-free since that time.  No cough or cold symptoms.  No leg pain or swelling.      Past Medical History:  Diagnosis Date   Bipolar disorder ([place])    Depression    Diabetes mellitus without complication ([place])    Dysrhythmia    a fib   Heart disease    History of chicken pox    Hyperlipidemia    Hypertension     Patient Active Problem List   Diagnosis Date Noted   S/P lumbar spinal fusion 01/16/2017   Elevated hemoglobin A1c 09/18/2014   Hypovitaminosis D 09/15/2014   Visit for preventive health examination 09/11/2014   Fatigue due to depression 09/11/2014   Anxiety and depression 08/28/2014   Essential hypertension, benign 08/28/2014   Hyperlipidemia 08/28/2014   Coronary artery disease involving coronary bypass graft of native heart with unspecified angina  pectoris 08/28/2014    Past Surgical History:  Procedure Laterality Date   ABDOMINAL HYSTERECTOMY  2001   BYPASS GRAFT  01.12.2016   MULTIPLE TOOTH EXTRACTIONS     Denturees   TONSILLECTOMY  1970   WISDOM TOOTH EXTRACTION       OB History   No obstetric history on file.     Family History  Problem Relation Age of Onset   Stroke Father 47       Deceased   Depression Father    Hypertension Father    Heart disease Father    Hypertension Mother        Living   Hypertension Sister    Healthy Son        x1   Healthy Daughter        x1    Social History   Tobacco Use   Smoking status: Former    Types: Cigarettes    Quit date: 07/07/2014    Years since quitting: 6.9   Smokeless tobacco: Never  Vaping Use   Vaping Use: Never used  Substance Use Topics   Alcohol use: Not Currently    Alcohol/week: 10.0 standard drinks    Types: 10 Standard drinks or equivalent per week    Comment: occ   Drug use: No  Home Medications Prior to Admission medications   Medication Sig Start Date End Date Taking? Authorizing Provider  ALPRAZolam Prudy Feeler) 1 MG tablet Take 1 tablet (1 mg total) by mouth 2 (two) times daily as needed for anxiety. Patient taking differently: Take 1 mg by mouth 3 (three) times daily as needed for anxiety.  09/19/14   Waldon Merl, PA-C  APPLE CIDER VINEGAR PO Take 30 mg by mouth daily.    [provider]  aspirin 81 MG tablet Take 81 mg by mouth every evening.     [provider]  atorvastatin (LIPITOR) 80 MG tablet Take 0.5 tablets (40 mg total) by mouth daily. 08/28/14   Waldon Merl, PA-C  carvedilol (COREG) 6.25 MG tablet Take 6.25 mg by mouth 2 (two) times daily. 11/12/16   [provider]  ELIQUIS 5 MG TABS tablet Take 1 tablet (5 mg total) by mouth 2 (two) times daily. 01/23/17   Costella, Darci Current, PA-C  hydrochlorothiazide (HYDRODIURIL) 25 MG tablet Take 1 tablet (25 mg total) by mouth daily. Patient not taking:  Reported on 01/05/2017 08/28/14   Waldon Merl, PA-C  HYDROcodone-acetaminophen (NORCO/VICODIN) 5-325 MG tablet Take 1-2 tablets by mouth every 6 (six) hours as needed (for pain; may cause constipation). 01/18/17   Costella, Darci Current, PA-C  lisinopril (PRINIVIL,ZESTRIL) 10 MG tablet Take 1 tablet (10 mg total) by mouth daily. Patient not taking: Reported on 01/05/2017 08/28/14   Waldon Merl, PA-C  losartan (COZAAR) 100 MG tablet Take 100 mg by mouth daily. 11/05/16   [provider]  metFORMIN (GLUCOPHAGE) 500 MG tablet Take 500 mg by mouth 2 (two) times daily with a meal.    [provider]  methocarbamol (ROBAXIN) 500 MG tablet Take 1 tablet (500 mg total) by mouth 2 (two) times daily. 06/26/18   Khatri, Hina, PA-C  omeprazole (PRILOSEC) 20 MG capsule Take 20 mg by mouth daily.    [provider]  ondansetron (ZOFRAN ODT) 4 MG disintegrating tablet Take 1 tablet (4 mg total) by mouth every 8 (eight) hours as needed for nausea or vomiting. 05/09/21   Placido Sou, PA-C  oseltamivir (TAMIFLU) 75 MG capsule Take 1 capsule (75 mg total) by mouth every 12 (twelve) hours. 10/07/17   Cathren Laine, MD  QUEtiapine (SEROQUEL) 100 MG tablet Take 200 mg by mouth at bedtime. 11/21/16   [provider]  venlafaxine XR (EFFEXOR-XR) 75 MG 24 hr capsule Take 1 capsule (75 mg total) by mouth daily with breakfast. Patient taking differently: Take 75 mg by mouth 3 (three) times daily.  10/03/14   Waldon Merl, PA-C  Vitamin D, Ergocalciferol, (DRISDOL) 50000 UNITS CAPS capsule Take 1 capsule (50,000 Units total) by mouth every 7 (seven) days. 09/15/14   Waldon Merl, PA-C    Allergies    Ace inhibitors and Naprosyn [naproxen]  Review of Systems   Review of Systems  Constitutional:  Negative for chills, diaphoresis, fatigue and fever.  HENT:  Negative for congestion, rhinorrhea and sneezing.   Eyes: Negative.   Respiratory:  Positive for shortness of breath.  Negative for cough and chest tightness.   Cardiovascular:  Positive for chest pain. Negative for leg swelling.  Gastrointestinal:  Negative for abdominal pain, blood in stool, diarrhea, nausea and vomiting.  Genitourinary:  Negative for difficulty urinating, flank pain, frequency and hematuria.  Musculoskeletal:  Negative for arthralgias and back pain.  Skin:  Negative for rash.  Neurological:  Negative for dizziness, speech  difficulty, weakness, numbness and headaches.   Physical Exam Updated Vital Signs BP 136/80   Pulse 77   Temp 98.1 F (36.7 C) (Oral)   Resp 16   Ht 5\' 5"  (1.651 m)   Wt 93 kg   SpO2 96%   BMI 34.12 kg/m   Physical Exam Constitutional:      Appearance: She is well-developed.  HENT:     Head: Normocephalic and atraumatic.  Eyes:     Pupils: Pupils are equal, round, and reactive to light.  Cardiovascular:     Rate and Rhythm: Normal rate and regular rhythm.     Heart sounds: Normal heart sounds.  Pulmonary:     Effort: Pulmonary effort is normal. No respiratory distress.     Breath sounds: Normal breath sounds. No wheezing or rales.  Chest:     Chest wall: Tenderness (Some tenderness on palpation of the center of her chest wall, no crepitus or deformity) present.  Abdominal:     General: Bowel sounds are normal.     Palpations: Abdomen is soft.     Tenderness: There is no abdominal tenderness. There is no guarding or rebound.  Musculoskeletal:        General: Normal range of motion.     Cervical back: Normal range of motion and neck supple.     Comments: No edema or calf tenderness  Lymphadenopathy:     Cervical: No cervical adenopathy.  Skin:    General: Skin is warm and dry.     Findings: No rash.  Neurological:     Mental Status: She is alert and oriented to person, place, and time.    ED Results / Procedures / Treatments   Labs (all labs ordered are listed, but only abnormal results are displayed) Labs Reviewed  BASIC METABOLIC PANEL -  Abnormal; Notable for the following components:      Result Value   Glucose, Bld 220 (*)    All other components within normal limits  CBC - Abnormal; Notable for the following components:   RBC 5.17 (*)    RDW 15.8 (*)    All other components within normal limits  TROPONIN I (HIGH SENSITIVITY)  TROPONIN I (HIGH SENSITIVITY)    EKG EKG Interpretation  Date/Time:  Thursday June 13 2021 19:25:46 EST Ventricular Rate:  94 PR Interval:  146 QRS Duration: 78 QT Interval:  372 QTC Calculation: 465 R Axis:   68 Text Interpretation: Normal sinus rhythm Nonspecific ST and T wave abnormality Prolonged QT Abnormal ECG since last tracing no significant change Confirmed by Malvin Johns [phone]) on 06/13/2021 7:28:27 PM  Radiology DG Chest 2 View  Result Date: 06/13/2021 CLINICAL DATA:  Chest tightness starting yesterday. Shortness of breath EXAM: CHEST - 2 VIEW COMPARISON:  03/21/2020 radiographs FINDINGS: Prior median sternotomy and prior CABG. The lungs appear clear. Thoracic spondylosis. No substantial blunting of the costophrenic angles. IMPRESSION: 1.  No active cardiopulmonary disease is radiographically apparent. 2. Prior CABG. Electronically Signed   By: Van Clines M.D.   On: 06/13/2021 19:45    Procedures Procedures   Medications Ordered in ED Medications - No data to display  ED Course  I have reviewed the triage vital signs and the nursing notes.  Pertinent labs & imaging results that were available during my care of the patient were reviewed by me and considered in my medical decision making (see chart for details).    MDM Rules/Calculators/A&P  Patient is a 55 year old female who presents with chest pain.  She does have significant risk factors although her pain is atypical.  It is not exertional.  It somewhat reproducible on palpation.  She did not have significant associated symptoms.  Her EKG does not show any ischemic changes.  Her  2 troponins are negative.  She does not have other symptoms that would be more concerning for things such as PE or dissection.  She currently is symptom-free.  She is followed by [facility].  Given her risk factors they discussed with her close follow-up with her cardiologist versus admission.  She has decided that she will follow-up with her cardiologist tomorrow.  I feel this is appropriate given her atypical symptoms.  Strict return precautions were given. Final Clinical Impression(s) / ED Diagnoses Final diagnoses:  Nonspecific chest pain    Rx / DC Orders ED Discharge Orders     None        Malvin Johns, MD 06/13/21 2255

## 2021-06-13 NOTE — ED Triage Notes (Signed)
Chest tightness since last night. She used 2 nitro with no relief. Pain in her right arm. EKG at triage.

## 2021-06-13 NOTE — ED Notes (Signed)
Pt. Reports her pain in the R arm and across her chest started last night and then went away.  Pt. Reports it then happened again today approx. 2.5 hrs ago so she came in to the ED.

## 2021-06-13 NOTE — ED Notes (Signed)
Attempted IV in right AC unsuccessful.  

## 2021-06-13 NOTE — Discharge Instructions (Signed)
Follow-up with your cardiologist tomorrow.  Return to emergency room if you have any worsening symptoms.

## 2021-07-16 ENCOUNTER — Encounter: Payer: Self-pay | Admitting: Cardiovascular Disease

## 2021-07-16 ENCOUNTER — Ambulatory Visit (INDEPENDENT_AMBULATORY_CARE_PROVIDER_SITE_OTHER): Payer: Medicare HMO | Admitting: Cardiovascular Disease

## 2021-07-16 ENCOUNTER — Other Ambulatory Visit: Payer: Self-pay

## 2021-07-16 VITALS — BP 150/90 | HR 92 | Ht 65.0 in | Wt 205.0 lb

## 2021-07-16 DIAGNOSIS — I739 Peripheral vascular disease, unspecified: Secondary | ICD-10-CM | POA: Insufficient documentation

## 2021-07-16 DIAGNOSIS — E785 Hyperlipidemia, unspecified: Secondary | ICD-10-CM

## 2021-07-16 NOTE — Progress Notes (Signed)
07/16/2021 Charmaine Downs   1965-07-31  GS:[id number]  Primary Physician System, Provider Not In Primary Cardiologist: Lorretta Harp MD Garret Reddish, [place], [place]  HPI:  Rachael Becker is a 56 y.o. moderately overweight divorced African-American female mother of 2, grandmother of 2 grandchildren who is disabled because of prior bypass surgery and back surgery.  She was referred by Geni Bers for evaluation of symptomatic PAD.  She has a history of CAD status post CABG x4 at [facility] in 2016.  She has had back surgery as well.  Her cardiovascular risk factor profile is notable for greater than 60 pack years of tobacco abuse currently smoking a pack a day with the desire to stop.  She apparently is wearing a NicoDerm patch.  She history of hypertension, hyperlipidemia and diabetes.  She is never had a heart attack or stroke.  Her father did have CAD.  She denies chest pain or shortness of breath.  She also has PAF currently in sinus rhythm on Eliquis oral anticoagulation.  She is symptomatic claudication for the last 5 or 6 years right greater than left with recent Doppler studies performed by her referring cardiologist revealing ABIs in the 0.5 range with moderate SFA disease and potential occluded left SFA.   Current Meds  Medication Sig   ALPRAZolam (XANAX) 1 MG tablet Take 1 tablet (1 mg total) by mouth 2 (two) times daily as needed for anxiety. (Patient taking differently: Take 1 mg by mouth 3 (three) times daily as needed for anxiety.)   aspirin 81 MG tablet Take 81 mg by mouth every evening.    atorvastatin (LIPITOR) 80 MG tablet Take 0.5 tablets (40 mg total) by mouth daily.   carvedilol (COREG) 6.25 MG tablet Take 6.25 mg by mouth 2 (two) times daily.   ELIQUIS 5 MG TABS tablet Take 1 tablet (5 mg total) by mouth 2 (two) times daily.   hydrochlorothiazide (HYDRODIURIL) 25 MG tablet Take 1 tablet (25 mg total) by mouth daily.   HYDROcodone-acetaminophen  (NORCO/VICODIN) 5-325 MG tablet Take 1-2 tablets by mouth every 6 (six) hours as needed (for pain; may cause constipation).   lisinopril (PRINIVIL,ZESTRIL) 10 MG tablet Take 1 tablet (10 mg total) by mouth daily.   losartan (COZAAR) 100 MG tablet Take 100 mg by mouth daily.   methocarbamol (ROBAXIN) 500 MG tablet Take 1 tablet (500 mg total) by mouth 2 (two) times daily.   omeprazole (PRILOSEC) 20 MG capsule Take 20 mg by mouth daily.   ondansetron (ZOFRAN ODT) 4 MG disintegrating tablet Take 1 tablet (4 mg total) by mouth every 8 (eight) hours as needed for nausea or vomiting.   QUEtiapine (SEROQUEL) 100 MG tablet Take 200 mg by mouth at bedtime.   Semaglutide (RYBELSUS) 3 MG TABS Take 1 tablet by mouth daily.   venlafaxine XR (EFFEXOR-XR) 75 MG 24 hr capsule Take 1 capsule (75 mg total) by mouth daily with breakfast. (Patient taking differently: Take 75 mg by mouth 3 (three) times daily.)     Allergies  Allergen Reactions   Ace Inhibitors Cough   Naprosyn [Naproxen] Nausea And Vomiting    Social History   Socioeconomic History   Marital status: Single    Spouse name: Not on file   Number of children: Not on file   Years of education: Not on file   Highest education level: Not on file  Occupational History   Not on file  Tobacco Use   Smoking status:  Every Day    Types: Cigarettes    Last attempt to quit: 07/07/2014    Years since quitting: 7.0   Smokeless tobacco: Never  Vaping Use   Vaping Use: Never used  Substance and Sexual Activity   Alcohol use: Not Currently    Alcohol/week: 10.0 standard drinks    Types: 10 Standard drinks or equivalent per week    Comment: occ   Drug use: No   Sexual activity: Not Currently    Birth control/protection: Surgical  Other Topics Concern   Not on file  Social History Narrative   Not on file   Social Determinants of Health   Financial Resource Strain: Not on file  Food Insecurity: Not on file  Transportation Needs: Not on file   Physical Activity: Not on file  Stress: Not on file  Social Connections: Not on file  Intimate Partner Violence: Not on file     Review of Systems: General: negative for chills, fever, night sweats or weight changes.  Cardiovascular: negative for chest pain, dyspnea on exertion, edema, orthopnea, palpitations, paroxysmal nocturnal dyspnea or shortness of breath Dermatological: negative for rash Respiratory: negative for cough or wheezing Urologic: negative for hematuria Abdominal: negative for nausea, vomiting, diarrhea, bright red blood per rectum, melena, or hematemesis Neurologic: negative for visual changes, syncope, or dizziness All other systems reviewed and are otherwise negative except as noted above.    Blood pressure (!) 198/120, pulse 92, height 5\' 5"  (1.651 m), weight 205 lb (93 kg), SpO2 97 %.  General appearance: alert and no distress Neck: no adenopathy, no carotid bruit, no JVD, supple, symmetrical, trachea midline, and thyroid not enlarged, symmetric, no tenderness/mass/nodules Lungs: clear to auscultation bilaterally Heart: regular rate and rhythm, S1, S2 normal, no murmur, click, rub or gallop Extremities: extremities normal, atraumatic, no cyanosis or edema Pulses: 2+ and symmetric Skin: Skin color, texture, turgor normal. No rashes or lesions Neurologic: Grossly normal  EKG sinus rhythm at 92 with LVH voltage and lateral T wave inversion.  I personally reviewed this EKG.  ASSESSMENT AND PLAN:   Peripheral arterial disease [facility]) Rachael Becker was referred to me by [name], PA-C for evaluation of symptomatic PAD.  She has a history of CABG x4 in [place] in 2016.  Risk factors otherwise are notable for 60 pack years of tobacco abuse currently smoking a pack a day with a patch.  She has treated hypertension, hyperlipidemia and diabetes.  Her father did have CAD as well.  She is never had a heart attack or stroke.  She has had claudication right greater than left  for the last 5 to 6 years.  She also walks with a cane because of back issues.  She had Doppler studies performed by her referring doctor revealing ABIs in the 8.5 range with occluded left SFA.  I am going to repeat lower extremity Doppler studies with the intent to perform angiography and potential endovascular therapy.     Lorretta Harp MD FACP,FACC,FAHA, [facility] 07/16/2021 10:13 AM

## 2021-07-16 NOTE — Patient Instructions (Signed)
Medication Instructions:  Your physician recommends that you continue on your current medications as directed. Please refer to the Current Medication list given to you today.  *If you need a refill on your cardiac medications before your next appointment, please call your pharmacy*   Testing/Procedures: Dr. Berry has recommended that you have an Ultrasound of your AORTA/IVC/ILIACS.   To prepare for this test:  No food after 11PM the night before. Water is OK. (Don't drink liquids if you have been instructed not to for ANOTHER test).  Avoid foods that produce bowel gas, for 24 hours prior to exam (see below). No breakfast, no chewing gum, no smoking or carbonated beverages. Patient may take morning medications with water. Come in for test at least 15 minutes early to register.  Your physician has requested that you have a lower extremity arterial duplex. This test is an ultrasound of the arteries in the legs. It looks at arterial blood flow in the legs. Allow one hour for Lower Arterial scans. There are no restrictions or special instructions.  Your physician has requested that you have an ankle brachial index (ABI). During this test an ultrasound and blood pressure cuff are used to evaluate the arteries that supply the arms and legs with blood. Allow thirty minutes for this exam. There are no restrictions or special instructions.  These procedures are done at [address]   Follow-Up: At [facility], you and your health needs are our priority.  As part of our continuing mission to provide you with exceptional heart care, we have created designated Provider Care Teams.  These Care Teams include your primary Cardiologist (physician) and Advanced Practice Providers (APPs -  Physician Assistants and Nurse Practitioners) who all work together to provide you with the care you need, when you need it.  We recommend signing up for the patient portal called "MyChart".  Sign up information  is provided on this After Visit Summary.  MyChart is used to connect with patients for Virtual Visits (Telemedicine).  Patients are able to view lab/test results, encounter notes, upcoming appointments, etc.  Non-urgent messages can be sent to your provider as well.   To learn more about what you can do with MyChart, go to [email or web address].    Your next appointment:   We will see you on an as needed basis.  Provider:   Jonathan Berry, MD 

## 2021-07-16 NOTE — Assessment & Plan Note (Signed)
Rachael Becker was referred to me by Arnette Felts, PA-C for evaluation of symptomatic PAD.  She has a history of CABG x4 in [place] in 2016.  Risk factors otherwise are notable for 60 pack years of tobacco abuse currently smoking a pack a day with a patch.  She has treated hypertension, hyperlipidemia and diabetes.  Her father did have CAD as well.  She is never had a heart attack or stroke.  She has had claudication right greater than left for the last 5 to 6 years.  She also walks with a cane because of back issues.  She had Doppler studies performed by her referring doctor revealing ABIs in the 8.5 range with occluded left SFA.  I am going to repeat lower extremity Doppler studies with the intent to perform angiography and potential endovascular therapy.

## 2021-08-05 ENCOUNTER — Ambulatory Visit (HOSPITAL_COMMUNITY)
Admission: RE | Admit: 2021-08-05 | Discharge: 2021-08-05 | Disposition: A | Discharge status: Home / Self Care | Payer: Medicare HMO | Source: Ambulatory Visit | Attending: Cardiology | Admitting: Cardiology

## 2021-08-05 ENCOUNTER — Ambulatory Visit (HOSPITAL_BASED_OUTPATIENT_CLINIC_OR_DEPARTMENT_OTHER)
Admission: RE | Admit: 2021-08-05 | Discharge: 2021-08-05 | Disposition: A | Discharge status: Home / Self Care | Payer: Medicare HMO | Source: Ambulatory Visit | Attending: Cardiology | Admitting: Cardiology

## 2021-08-05 ENCOUNTER — Other Ambulatory Visit: Payer: Self-pay

## 2021-08-05 DIAGNOSIS — M79661 Pain in right lower leg: Secondary | ICD-10-CM | POA: Diagnosis not present

## 2021-08-05 DIAGNOSIS — M79662 Pain in left lower leg: Secondary | ICD-10-CM

## 2021-08-05 DIAGNOSIS — E785 Hyperlipidemia, unspecified: Secondary | ICD-10-CM | POA: Insufficient documentation

## 2021-08-05 DIAGNOSIS — I739 Peripheral vascular disease, unspecified: Secondary | ICD-10-CM | POA: Diagnosis not present

## 2021-08-12 ENCOUNTER — Telehealth: Payer: Self-pay | Admitting: Cardiovascular Disease

## 2021-08-12 NOTE — Telephone Encounter (Signed)
Pt called in and stated she was just calling to check on the results of the test she had done on 1/30.  She stated she was thinking there was some type of procedure was suppose to be schedule.  She was just concerned about that results.  Best number for pt is [phone]

## 2021-08-12 NOTE — Telephone Encounter (Signed)
Called pt and gave results of arterial US duplex and vascular aortic/iliac results. Informed patient we will call her with an appointment with Dr. Allyson Sabal. She had no other questions/concerns.

## 2021-08-13 NOTE — Telephone Encounter (Signed)
Spoke with pt regarding office visit. Able to schedule appointment for pt. Pt verbalizes understanding.

## 2021-08-14 ENCOUNTER — Ambulatory Visit: Payer: Medicare HMO | Admitting: Cardiovascular Disease

## 2021-10-23 ENCOUNTER — Ambulatory Visit (INDEPENDENT_AMBULATORY_CARE_PROVIDER_SITE_OTHER): Payer: Medicare HMO | Admitting: Cardiovascular Disease

## 2021-10-23 ENCOUNTER — Encounter: Payer: Self-pay | Admitting: Cardiovascular Disease

## 2021-10-23 DIAGNOSIS — I739 Peripheral vascular disease, unspecified: Secondary | ICD-10-CM | POA: Diagnosis not present

## 2021-10-23 MED ORDER — CILOSTAZOL 50 MG PO TABS: 50.0000 mg | ORAL_TABLET | Freq: Two times a day (BID) | ORAL | 3 refills | Status: DC

## 2021-10-23 NOTE — Patient Instructions (Signed)
Medication Instructions:  ?Your physician recommends that you continue on your current medications as directed. Please refer to the Current Medication list given to you today. ? ?*If you need a refill on your cardiac medications before your next appointment, please call your pharmacy* ? ? ?Lab Work: ?Your physician recommends that you labs drawn today: BMET and CBC ? ?If you have labs (blood work) drawn today and your tests are completely normal, you will receive your results only by: ?MyChart Message (if you have MyChart) OR ?A paper copy in the mail ?If you have any lab test that is abnormal or we need to change your treatment, we will call you to review the results. ? ? ?Testing/Procedures: ?Dr. Allyson Sabal has recommended that you have an Ultrasound of your AORTA/IVC/ILIACS.  ? ?To prepare for this test: ? ?No food after 11PM the night before. Water is OK. (Don't drink liquids if you have been instructed not to for ANOTHER test).  ?Avoid foods that produce bowel gas, for 24 hours prior to exam (see below). ?No breakfast, no chewing gum, no smoking or carbonated beverages. ?Patient may take morning medications with water. ?Come in for test at least 15 minutes early to register. ? ?Your physician has requested that you have a lower extremity arterial duplex. This test is an ultrasound of the arteries in the legs. It looks at arterial blood flow in the legs. Allow one hour for Lower Arterial scans. There are no restrictions or special instructions ? ?Your physician has requested that you have an ankle brachial index (ABI). During this test an ultrasound and blood pressure cuff are used to evaluate the arteries that supply the arms and legs with blood. Allow thirty minutes for this exam. There are no restrictions or special instructions. ?These will be done 1-2 weeks after your procedure (11/07/21). These are done at [address] ? ? ?Follow-Up: ?At [facility], you and your health needs are our priority.   As part of our continuing mission to provide you with exceptional heart care, we have created designated Provider Care Teams.  These Care Teams include your primary Cardiologist (physician) and Advanced Practice Providers (APPs -  Physician Assistants and Nurse Practitioners) who all work together to provide you with the care you need, when you need it. ? ?We recommend signing up for the patient portal called "MyChart".  Sign up information is provided on this After Visit Summary.  MyChart is used to connect with patients for Virtual Visits (Telemedicine).  Patients are able to view lab/test results, encounter notes, upcoming appointments, etc.  Non-urgent messages can be sent to your provider as well.   ?To learn more about what you can do with MyChart, go to [email or web address].   ? ?Your next appointment:   ?2-3 week(s) after your procedure ? ?The format for your next appointment:   ?In Person ? ?Provider:   ?Nanetta Batty, MD ? ? ?Other Instructions ? ?[facility] ?[facility] ?[address] ?[place] [address] ?Dept: [phone] ?Loc: [phone] ? ?Rachael Becker  10/23/2021 ? ?You are scheduled for a Peripheral Angiogram on Monday, May 4 with Dr. Nanetta Batty. ? ?1. Please arrive at the Main Entrance A at [facility]: [address] [place], [place] [address] at 7:30 AM (This time is two hours before your procedure to ensure your preparation). Free valet parking service is available.  ? ?Special note: Every effort is made to have your procedure  done on time. Please understand that emergencies sometimes delay scheduled procedures. ? ?2. Diet: Do not eat solid foods after midnight.  You may have clear liquids until 5 AM upon the day of the procedure. ? ?3. Labs: You will need to have blood drawn today.  ? ? ?4. Medication instructions in preparation for your procedure: ? ?  ?Stop taking Eliquis (Apixiban) on Saturday,  May 2. ? ?Stop taking, HTCZ (Hydrochlorothiazide) Monday, May 4, ? ?Do not take Diabetes Med Glucophage (Metformin) on the day of the procedure and HOLD 48 HOURS AFTER THE PROCEDURE. ? ?On the morning of your procedure, take Aspirin and any morning medicines NOT listed above.  You may use sips of water. ? ?5. Plan to go home the same day, you will only stay overnight if medically necessary. ?6. You MUST have a responsible adult to drive you home. ?7. An adult MUST be with you the first 24 hours after you arrive home. ?8. Bring a current list of your medications, and the last time and date medication taken. ?9. Bring ID and current insurance cards. ?10.Please wear clothes that are easy to get on and off and wear slip-on shoes. ? ?Thank you for allowing [place] to care for you! ?  -- [facility] ? ?

## 2021-10-23 NOTE — Progress Notes (Signed)
? ? ? ?10/23/2021 ?Rachael SnipesVenetia Becker   ?10/05/1965  ?[id number] ? ?Primary Physician System, Provider Not In ?Primary Cardiologist: Runell GessJonathan J Jeramiah Mccaughey MD Nicholes CalamityFACP, FACC, FAHA, [place] ? ?HPI:  Rachael Becker is a 56 y.o.  moderately overweight divorced African-American female mother of 2, grandmother of 2 grandchildren who is disabled because of prior bypass surgery and back surgery.  She was referred by Rachael MaesMike DuranPA-C for evaluation of symptomatic PAD.  I last saw her in the office 07/16/2021.  She has a history of CAD status post CABG x4 at [facility] in 2016.  She has had back surgery as well.  Her cardiovascular risk factor profile is notable for greater than 60 pack years of tobacco abuse currently smoking a pack a day with the desire to stop.  She apparently is wearing a NicoDerm patch.  She history of hypertension, hyperlipidemia and diabetes.  She is never had a heart attack or stroke.  Her father did have CAD.  She denies chest pain or shortness of breath.  She also has PAF currently in sinus rhythm on Eliquis oral anticoagulation.  She is symptomatic claudication for the last 5 or 6 years right greater than left with recent Doppler studies performed by her referring cardiologist revealing ABIs in the 0.5 range with moderate SFA disease and potential occluded left SFA. ? ?Since I saw her 3 months ago we did get lower extremity arterial Doppler studies on 08/05/2021 revealing a right ABI of 0.72, left of 0.68.  She did have moderate right SFA disease and an occluded right anterior tibial as well as an occluded left SFA and anterior tibial.  She does complain of claudication although her symptoms are somewhat atypical, right greater than left.  She does continue to smoke.  She denies chest pain or shortness of breath. ? ? ?Current Meds  ?Medication Sig  ? ALPRAZolam (XANAX) 1 MG tablet Take 1 tablet (1 mg total) by mouth 2 (two) times daily as needed for anxiety. (Patient taking differently: Take 1 mg by  mouth 3 (three) times daily as needed for anxiety.)  ? APPLE CIDER VINEGAR PO Take 30 mg by mouth daily.  ? aspirin 81 MG tablet Take 81 mg by mouth every evening.   ? atorvastatin (LIPITOR) 80 MG tablet Take 0.5 tablets (40 mg total) by mouth daily.  ? carvedilol (COREG) 6.25 MG tablet Take 6.25 mg by mouth 2 (two) times daily.  ? cilostazol (PLETAL) 50 MG tablet Take 1 tablet (50 mg total) by mouth 2 (two) times daily.  ? ELIQUIS 5 MG TABS tablet Take 1 tablet (5 mg total) by mouth 2 (two) times daily.  ? hydrochlorothiazide (HYDRODIURIL) 25 MG tablet Take 1 tablet (25 mg total) by mouth daily.  ? HYDROcodone-acetaminophen (NORCO/VICODIN) 5-325 MG tablet Take 1-2 tablets by mouth every 6 (six) hours as needed (for pain; may cause constipation).  ? lisinopril (PRINIVIL,ZESTRIL) 10 MG tablet Take 1 tablet (10 mg total) by mouth daily.  ? losartan (COZAAR) 100 MG tablet Take 100 mg by mouth daily.  ? metFORMIN (GLUCOPHAGE) 500 MG tablet Take 500 mg by mouth 2 (two) times daily with a meal.  ? methocarbamol (ROBAXIN) 500 MG tablet Take 1 tablet (500 mg total) by mouth 2 (two) times daily.  ? omeprazole (PRILOSEC) 20 MG capsule Take 20 mg by mouth daily.  ? ondansetron (ZOFRAN ODT) 4 MG disintegrating tablet Take 1 tablet (4 mg total) by mouth every 8 (eight) hours as needed for nausea or  vomiting.  ? QUEtiapine (SEROQUEL) 100 MG tablet Take 200 mg by mouth at bedtime.  ? Semaglutide (RYBELSUS) 3 MG TABS Take 1 tablet by mouth daily.  ? venlafaxine XR (EFFEXOR-XR) 75 MG 24 hr capsule Take 1 capsule (75 mg total) by mouth daily with breakfast. (Patient taking differently: Take 75 mg by mouth 3 (three) times daily.)  ?  ? ?Allergies  ?Allergen Reactions  ? Ace Inhibitors Cough  ? Naprosyn [Naproxen] Nausea And Vomiting  ? ? ?Social History  ? ?Socioeconomic History  ? Marital status: Single  ?  Spouse name: Not on file  ? Number of children: Not on file  ? Years of education: Not on file  ? Highest education level: Not  on file  ?Occupational History  ? Not on file  ?Tobacco Use  ? Smoking status: Every Day  ?  Types: Cigarettes  ?  Last attempt to quit: 07/07/2014  ?  Years since quitting: 7.3  ? Smokeless tobacco: Never  ?Vaping Use  ? Vaping Use: Never used  ?Substance and Sexual Activity  ? Alcohol use: Not Currently  ?  Alcohol/week: 10.0 standard drinks  ?  Types: 10 Standard drinks or equivalent per week  ?  Comment: occ  ? Drug use: No  ? Sexual activity: Not Currently  ?  Birth control/protection: Surgical  ?Other Topics Concern  ? Not on file  ?Social History Narrative  ? Not on file  ? ?Social Determinants of Health  ? ?Financial Resource Strain: Not on file  ?Food Insecurity: Not on file  ?Transportation Needs: Not on file  ?Physical Activity: Not on file  ?Stress: Not on file  ?Social Connections: Not on file  ?Intimate Partner Violence: Not on file  ?  ? ?Review of Systems: ?General: negative for chills, fever, night sweats or weight changes.  ?Cardiovascular: negative for chest pain, dyspnea on exertion, edema, orthopnea, palpitations, paroxysmal nocturnal dyspnea or shortness of breath ?Dermatological: negative for rash ?Respiratory: negative for cough or wheezing ?Urologic: negative for hematuria ?Abdominal: negative for nausea, vomiting, diarrhea, bright red blood per rectum, melena, or hematemesis ?Neurologic: negative for visual changes, syncope, or dizziness ?All other systems reviewed and are otherwise negative except as noted above. ? ? ? ?Blood pressure 116/78, pulse 69, height 5\' 5"  (1.651 m), weight 201 lb (91.2 kg).  ?General appearance: alert and no distress ?Neck: no adenopathy, no carotid bruit, no JVD, supple, symmetrical, trachea midline, and thyroid not enlarged, symmetric, no tenderness/mass/nodules ?Lungs: clear to auscultation bilaterally ?Heart: regular rate and rhythm, S1, S2 normal, no murmur, click, rub or gallop ?Extremities: extremities normal, atraumatic, no cyanosis or edema ?Pulses:  Decreased pedal pulses ?Skin: Skin color, texture, turgor normal. No rashes or lesions ?Neurologic: Grossly normal ? ?EKG not performed today ? ?ASSESSMENT AND PLAN:  ? ?Peripheral arterial disease (HCC) ?Rachael Becker returns for evaluation of PAD.  She was referred to me by Hollice Espy, PA-C.  She had Doppler studies performed in our office 08/05/2021 revealing a right ABI of 0.72 and a left of 0.68.  There is no aortoiliac disease.  She did have moderate disease in her right SFA with an occluded right anterior tibial artery as well as an occluded left SFA and anterior tibial artery.  Her symptoms sound somewhat out of proportion to the degree of PAD especially since her right leg is the most affected.  She has been on Pletal and the past without benefit.  She wishes to proceed with angiography and potential  endovascular therapy. ? ? ? ? ?Runell Gess MD FACP,FACC,FAHA, FSCAI ?10/23/2021 ?11:34 AM ?

## 2021-10-23 NOTE — Assessment & Plan Note (Addendum)
Rachael Becker returns for evaluation of PAD.  She was referred to me by [name], PA-C.  She had Doppler studies performed in our office 08/05/2021 revealing a right ABI of 0.72 and a left of 0.68.  There is no aortoiliac disease.  She did have moderate disease in her right SFA with an occluded right anterior tibial artery as well as an occluded left SFA and anterior tibial artery.  Her symptoms sound somewhat out of proportion to the degree of PAD especially since her right leg is the most affected.  She has been on Pletal and the past without benefit.  She wishes to proceed with angiography and potential endovascular therapy. ?

## 2021-10-23 NOTE — H&P (View-Only) (Signed)
? ? ? ?10/23/2021 ?Tessia Meroney   ?06/29/1966  ?[id number] ? ?Primary Physician System, Provider Not In ?Primary Cardiologist: Zaryah Seckel J Earlena Werst MD FACP, FACC, FAHA, FSCAI ? ?HPI:  Rachael Becker is a 56 y.o.  moderately overweight divorced African-American female mother of 2, grandmother of 2 grandchildren who is disabled because of prior bypass surgery and back surgery.  She was referred by Mike DuranPA-C for evaluation of symptomatic PAD.  I last saw her in the office 07/16/2021.  She has a history of CAD status post CABG x4 at [facility] in 2016.  She has had back surgery as well.  Her cardiovascular risk factor profile is notable for greater than 60 pack years of tobacco abuse currently smoking a pack a day with the desire to stop.  She apparently is wearing a NicoDerm patch.  She history of hypertension, hyperlipidemia and diabetes.  She is never had a heart attack or stroke.  Her father did have CAD.  She denies chest pain or shortness of breath.  She also has PAF currently in sinus rhythm on Eliquis oral anticoagulation.  She is symptomatic claudication for the last 5 or 6 years right greater than left with recent Doppler studies performed by her referring cardiologist revealing ABIs in the 0.5 range with moderate SFA disease and potential occluded left SFA. ? ?Since I saw her 3 months ago we did get lower extremity arterial Doppler studies on 08/05/2021 revealing a right ABI of 0.72, left of 0.68.  She did have moderate right SFA disease and an occluded right anterior tibial as well as an occluded left SFA and anterior tibial.  She does complain of claudication although her symptoms are somewhat atypical, right greater than left.  She does continue to smoke.  She denies chest pain or shortness of breath. ? ? ?Current Meds  ?Medication Sig  ? ALPRAZolam (XANAX) 1 MG tablet Take 1 tablet (1 mg total) by mouth 2 (two) times daily as needed for anxiety. (Patient taking differently: Take 1 mg by  mouth 3 (three) times daily as needed for anxiety.)  ? APPLE CIDER VINEGAR PO Take 30 mg by mouth daily.  ? aspirin 81 MG tablet Take 81 mg by mouth every evening.   ? atorvastatin (LIPITOR) 80 MG tablet Take 0.5 tablets (40 mg total) by mouth daily.  ? carvedilol (COREG) 6.25 MG tablet Take 6.25 mg by mouth 2 (two) times daily.  ? cilostazol (PLETAL) 50 MG tablet Take 1 tablet (50 mg total) by mouth 2 (two) times daily.  ? ELIQUIS 5 MG TABS tablet Take 1 tablet (5 mg total) by mouth 2 (two) times daily.  ? hydrochlorothiazide (HYDRODIURIL) 25 MG tablet Take 1 tablet (25 mg total) by mouth daily.  ? HYDROcodone-acetaminophen (NORCO/VICODIN) 5-325 MG tablet Take 1-2 tablets by mouth every 6 (six) hours as needed (for pain; may cause constipation).  ? lisinopril (PRINIVIL,ZESTRIL) 10 MG tablet Take 1 tablet (10 mg total) by mouth daily.  ? losartan (COZAAR) 100 MG tablet Take 100 mg by mouth daily.  ? metFORMIN (GLUCOPHAGE) 500 MG tablet Take 500 mg by mouth 2 (two) times daily with a meal.  ? methocarbamol (ROBAXIN) 500 MG tablet Take 1 tablet (500 mg total) by mouth 2 (two) times daily.  ? omeprazole (PRILOSEC) 20 MG capsule Take 20 mg by mouth daily.  ? ondansetron (ZOFRAN ODT) 4 MG disintegrating tablet Take 1 tablet (4 mg total) by mouth every 8 (eight) hours as needed for nausea or   vomiting.  ? QUEtiapine (SEROQUEL) 100 MG tablet Take 200 mg by mouth at bedtime.  ? Semaglutide (RYBELSUS) 3 MG TABS Take 1 tablet by mouth daily.  ? venlafaxine XR (EFFEXOR-XR) 75 MG 24 hr capsule Take 1 capsule (75 mg total) by mouth daily with breakfast. (Patient taking differently: Take 75 mg by mouth 3 (three) times daily.)  ?  ? ?Allergies  ?Allergen Reactions  ? Ace Inhibitors Cough  ? Naprosyn [Naproxen] Nausea And Vomiting  ? ? ?Social History  ? ?Socioeconomic History  ? Marital status: Single  ?  Spouse name: Not on file  ? Number of children: Not on file  ? Years of education: Not on file  ? Highest education level: Not  on file  ?Occupational History  ? Not on file  ?Tobacco Use  ? Smoking status: Every Day  ?  Types: Cigarettes  ?  Last attempt to quit: 07/07/2014  ?  Years since quitting: 7.3  ? Smokeless tobacco: Never  ?Vaping Use  ? Vaping Use: Never used  ?Substance and Sexual Activity  ? Alcohol use: Not Currently  ?  Alcohol/week: 10.0 standard drinks  ?  Types: 10 Standard drinks or equivalent per week  ?  Comment: occ  ? Drug use: No  ? Sexual activity: Not Currently  ?  Birth control/protection: Surgical  ?Other Topics Concern  ? Not on file  ?Social History Narrative  ? Not on file  ? ?Social Determinants of Health  ? ?Financial Resource Strain: Not on file  ?Food Insecurity: Not on file  ?Transportation Needs: Not on file  ?Physical Activity: Not on file  ?Stress: Not on file  ?Social Connections: Not on file  ?Intimate Partner Violence: Not on file  ?  ? ?Review of Systems: ?General: negative for chills, fever, night sweats or weight changes.  ?Cardiovascular: negative for chest pain, dyspnea on exertion, edema, orthopnea, palpitations, paroxysmal nocturnal dyspnea or shortness of breath ?Dermatological: negative for rash ?Respiratory: negative for cough or wheezing ?Urologic: negative for hematuria ?Abdominal: negative for nausea, vomiting, diarrhea, bright red blood per rectum, melena, or hematemesis ?Neurologic: negative for visual changes, syncope, or dizziness ?All other systems reviewed and are otherwise negative except as noted above. ? ? ? ?Blood pressure 116/78, pulse 69, height 5\' 5"  (1.651 m), weight 201 lb (91.2 kg).  ?General appearance: alert and no distress ?Neck: no adenopathy, no carotid bruit, no JVD, supple, symmetrical, trachea midline, and thyroid not enlarged, symmetric, no tenderness/mass/nodules ?Lungs: clear to auscultation bilaterally ?Heart: regular rate and rhythm, S1, S2 normal, no murmur, click, rub or gallop ?Extremities: extremities normal, atraumatic, no cyanosis or edema ?Pulses:  Decreased pedal pulses ?Skin: Skin color, texture, turgor normal. No rashes or lesions ?Neurologic: Grossly normal ? ?EKG not performed today ? ?ASSESSMENT AND PLAN:  ? ?Peripheral arterial disease (HCC) ?Ms. Walth returns for evaluation of PAD.  She was referred to me by Hollice Espy, PA-C.  She had Doppler studies performed in our office 08/05/2021 revealing a right ABI of 0.72 and a left of 0.68.  There is no aortoiliac disease.  She did have moderate disease in her right SFA with an occluded right anterior tibial artery as well as an occluded left SFA and anterior tibial artery.  Her symptoms sound somewhat out of proportion to the degree of PAD especially since her right leg is the most affected.  She has been on Pletal and the past without benefit.  She wishes to proceed with angiography and potential  endovascular therapy. ? ? ? ? ?Runell Gess MD FACP,FACC,FAHA, FSCAI ?10/23/2021 ?11:34 AM ?

## 2021-10-24 LAB — CBC
Hematocrit: 42.9 % (ref 34.0–46.6)
Hemoglobin: 13.8 g/dL (ref 11.1–15.9)
MCH: 27 pg (ref 26.6–33.0)
MCHC: 32.2 g/dL (ref 31.5–35.7)
MCV: 84 fL (ref 79–97)
Platelets: 215 10*3/uL (ref 150–450)
RBC: 5.11 x10E6/uL (ref 3.77–5.28)
RDW: 14.2 % (ref 11.7–15.4)
WBC: 8.5 10*3/uL (ref 3.4–10.8)

## 2021-10-24 LAB — BASIC METABOLIC PANEL
BUN/Creatinine Ratio: 13 (ref 9–23)
BUN: 12 mg/dL (ref 6–24)
CO2: 20 mmol/L (ref 20–29)
Calcium: 9.4 mg/dL (ref 8.7–10.2)
Chloride: 108 mmol/L — ABNORMAL HIGH (ref 96–106)
Creatinine, Ser: 0.89 mg/dL (ref 0.57–1.00)
Glucose: 150 mg/dL — ABNORMAL HIGH (ref 70–99)
Potassium: 4.6 mmol/L (ref 3.5–5.2)
Sodium: 147 mmol/L — ABNORMAL HIGH (ref 134–144)
eGFR: 76 mL/min/{1.73_m2} (ref >59)

## 2021-10-29 ENCOUNTER — Other Ambulatory Visit: Payer: Self-pay

## 2021-10-29 DIAGNOSIS — I739 Peripheral vascular disease, unspecified: Secondary | ICD-10-CM

## 2021-10-29 MED ORDER — SODIUM CHLORIDE 0.9% FLUSH: 3.0000 mL | Freq: Two times a day (BID) | INTRAVENOUS | Status: DC

## 2021-11-06 ENCOUNTER — Telehealth: Payer: Self-pay | Admitting: *Deleted

## 2021-11-06 NOTE — Telephone Encounter (Signed)
Abdominal aortogram scheduled at [facility] for: Thursday Nov 07, 2021 9:30 AM ?Arrival time and place: [facility] Main Entrance A at: 7:30 AM ? ? ?No solid food after midnight prior to cath, clear liquids until 5 AM day of procedure. ? ?Medication instructions: ?-Hold: ? Eliquis-pt reports none starting 11/04/21 until post procedure ? Rybelsus-AM of procedure ? HCTZ-AM of procedure ?-Except hold medications usual morning medications can be taken with sips of water including aspirin 81 mg. ? ?Confirmed patient has responsible adult to drive home post procedure and be with patient first 24 hours after arriving home. ? ?Patient reports no new symptoms concerning for COVID-19/no exposure to COVID-19 in the past 10 days. ? ?Reviewed procedure instructions with patient.  ? ?

## 2021-11-07 ENCOUNTER — Encounter (HOSPITAL_COMMUNITY)
Admission: RE | Disposition: A | Discharge status: Home / Self Care | Payer: Self-pay | Source: Home / Self Care | Attending: Cardiovascular Disease

## 2021-11-07 ENCOUNTER — Ambulatory Visit (HOSPITAL_COMMUNITY)
Admission: RE | Admit: 2021-11-07 | Discharge: 2021-11-08 | Disposition: A | Discharge status: Home / Self Care | Payer: Medicare HMO | Attending: Cardiovascular Disease | Admitting: Cardiovascular Disease

## 2021-11-07 ENCOUNTER — Other Ambulatory Visit: Payer: Self-pay

## 2021-11-07 DIAGNOSIS — Z7901 Long term (current) use of anticoagulants: Secondary | ICD-10-CM | POA: Diagnosis not present

## 2021-11-07 DIAGNOSIS — Z6833 Body mass index (BMI) 33.0-33.9, adult: Secondary | ICD-10-CM | POA: Diagnosis not present

## 2021-11-07 DIAGNOSIS — I2510 Atherosclerotic heart disease of native coronary artery without angina pectoris: Secondary | ICD-10-CM | POA: Diagnosis not present

## 2021-11-07 DIAGNOSIS — Z8249 Family history of ischemic heart disease and other diseases of the circulatory system: Secondary | ICD-10-CM | POA: Diagnosis not present

## 2021-11-07 DIAGNOSIS — I739 Peripheral vascular disease, unspecified: Secondary | ICD-10-CM | POA: Diagnosis present

## 2021-11-07 DIAGNOSIS — I480 Paroxysmal atrial fibrillation: Secondary | ICD-10-CM | POA: Insufficient documentation

## 2021-11-07 DIAGNOSIS — E1151 Type 2 diabetes mellitus with diabetic peripheral angiopathy without gangrene: Secondary | ICD-10-CM | POA: Insufficient documentation

## 2021-11-07 DIAGNOSIS — E785 Hyperlipidemia, unspecified: Secondary | ICD-10-CM | POA: Diagnosis not present

## 2021-11-07 DIAGNOSIS — F17210 Nicotine dependence, cigarettes, uncomplicated: Secondary | ICD-10-CM | POA: Insufficient documentation

## 2021-11-07 DIAGNOSIS — I10 Essential (primary) hypertension: Secondary | ICD-10-CM | POA: Diagnosis not present

## 2021-11-07 DIAGNOSIS — E782 Mixed hyperlipidemia: Secondary | ICD-10-CM

## 2021-11-07 DIAGNOSIS — I25709 Atherosclerosis of coronary artery bypass graft(s), unspecified, with unspecified angina pectoris: Secondary | ICD-10-CM | POA: Diagnosis present

## 2021-11-07 DIAGNOSIS — E663 Overweight: Secondary | ICD-10-CM | POA: Diagnosis not present

## 2021-11-07 DIAGNOSIS — Z7984 Long term (current) use of oral hypoglycemic drugs: Secondary | ICD-10-CM | POA: Diagnosis not present

## 2021-11-07 DIAGNOSIS — I70211 Atherosclerosis of native arteries of extremities with intermittent claudication, right leg: Secondary | ICD-10-CM | POA: Insufficient documentation

## 2021-11-07 DIAGNOSIS — Z951 Presence of aortocoronary bypass graft: Secondary | ICD-10-CM | POA: Diagnosis not present

## 2021-11-07 HISTORY — PX: PERIPHERAL VASCULAR ATHERECTOMY: CATH118256

## 2021-11-07 HISTORY — PX: ABDOMINAL AORTOGRAM W/LOWER EXTREMITY: CATH118223

## 2021-11-07 LAB — POCT ACTIVATED CLOTTING TIME
Activated Clotting Time: 251 seconds
Activated Clotting Time: 287 seconds
Activated Clotting Time: 263 seconds
Activated Clotting Time: 311 seconds
Activated Clotting Time: 185 seconds

## 2021-11-07 LAB — GLUCOSE, CAPILLARY
Glucose-Capillary: 184 mg/dL — ABNORMAL HIGH (ref 70–99)
Glucose-Capillary: 157 mg/dL — ABNORMAL HIGH (ref 70–99)
Glucose-Capillary: 237 mg/dL — ABNORMAL HIGH (ref 70–99)

## 2021-11-07 SURGERY — ABDOMINAL AORTOGRAM W/LOWER EXTREMITY
Anesthesia: LOCAL

## 2021-11-07 MED ORDER — PNEUMOCOCCAL 20-VAL CONJ VACC 0.5 ML IM SUSY
0.5000 mL | PREFILLED_SYRINGE | INTRAMUSCULAR | Status: DC
  Filled 2021-11-07: qty 0.5

## 2021-11-07 MED ORDER — MIDAZOLAM HCL 2 MG/2ML IJ SOLN
INTRAMUSCULAR | Status: DC | PRN
  Administered 2021-11-07 (×3): 1 mg via INTRAVENOUS

## 2021-11-07 MED ORDER — SODIUM CHLORIDE 0.9% FLUSH: 3.0000 mL | INTRAVENOUS | Status: DC | PRN

## 2021-11-07 MED ORDER — CLOPIDOGREL BISULFATE 300 MG PO TABS
ORAL_TABLET | ORAL | Status: DC | PRN
  Administered 2021-11-07: 300 mg via ORAL

## 2021-11-07 MED ORDER — ASPIRIN EC 81 MG PO TBEC
81.0000 mg | DELAYED_RELEASE_TABLET | Freq: Every day | ORAL | Status: DC
  Administered 2021-11-08: 81 mg via ORAL
  Filled 2021-11-07: qty 1

## 2021-11-07 MED ORDER — FENTANYL CITRATE (PF) 100 MCG/2ML IJ SOLN
INTRAMUSCULAR | Status: AC
  Filled 2021-11-07: qty 2

## 2021-11-07 MED ORDER — HEPARIN SODIUM (PORCINE) 1000 UNIT/ML IJ SOLN
INTRAMUSCULAR | Status: DC | PRN
  Administered 2021-11-07: 3000 [IU] via INTRAVENOUS
  Administered 2021-11-07: 10000 [IU] via INTRAVENOUS

## 2021-11-07 MED ORDER — LIDOCAINE HCL (PF) 1 % IJ SOLN
INTRAMUSCULAR | Status: DC | PRN
  Administered 2021-11-07: 20 mL

## 2021-11-07 MED ORDER — CLOPIDOGREL BISULFATE 75 MG PO TABS
75.0000 mg | ORAL_TABLET | Freq: Every day | ORAL | Status: DC
  Administered 2021-11-08: 75 mg via ORAL
  Filled 2021-11-07: qty 1

## 2021-11-07 MED ORDER — SODIUM CHLORIDE 0.9 % WEIGHT BASED INFUSION
3.0000 mL/kg/h | INTRAVENOUS | Status: DC
  Administered 2021-11-07: 3 mL/kg/h via INTRAVENOUS

## 2021-11-07 MED ORDER — HYDRALAZINE HCL 20 MG/ML IJ SOLN
INTRAMUSCULAR | Status: DC | PRN
Start: 2021-11-07 — End: 2021-11-07
  Administered 2021-11-07 (×2): 10 mg via INTRAVENOUS

## 2021-11-07 MED ORDER — ACETAMINOPHEN 325 MG PO TABS
650.0000 mg | ORAL_TABLET | ORAL | Status: DC | PRN
  Administered 2021-11-08: 650 mg via ORAL

## 2021-11-07 MED ORDER — HYDRALAZINE HCL 20 MG/ML IJ SOLN
INTRAMUSCULAR | Status: AC
  Filled 2021-11-07: qty 1

## 2021-11-07 MED ORDER — ASPIRIN 81 MG PO TABS: 81.0000 mg | ORAL_TABLET | Freq: Every evening | ORAL | Status: DC

## 2021-11-07 MED ORDER — MORPHINE SULFATE (PF) 2 MG/ML IV SOLN
2.0000 mg | INTRAVENOUS | Status: DC | PRN
  Administered 2021-11-07 (×2): 2 mg via INTRAVENOUS
  Filled 2021-11-07: qty 1

## 2021-11-07 MED ORDER — CLOPIDOGREL BISULFATE 300 MG PO TABS
ORAL_TABLET | ORAL | Status: AC
  Filled 2021-11-07: qty 1

## 2021-11-07 MED ORDER — LOSARTAN POTASSIUM 50 MG PO TABS
100.0000 mg | ORAL_TABLET | Freq: Every day | ORAL | Status: DC
  Administered 2021-11-07 – 2021-11-08 (×2): 100 mg via ORAL
  Filled 2021-11-07 (×2): qty 2

## 2021-11-07 MED ORDER — HEPARIN (PORCINE) IN NACL 1000-0.9 UT/500ML-% IV SOLN
INTRAVENOUS | Status: DC | PRN
  Administered 2021-11-07 (×2): 500 mL

## 2021-11-07 MED ORDER — IODIXANOL 320 MG/ML IV SOLN
INTRAVENOUS | Status: DC | PRN
  Administered 2021-11-07: 240 mL via INTRA_ARTERIAL

## 2021-11-07 MED ORDER — LABETALOL HCL 5 MG/ML IV SOLN
10.0000 mg | INTRAVENOUS | Status: DC | PRN
  Administered 2021-11-08: 10 mg via INTRAVENOUS
  Filled 2021-11-07: qty 4

## 2021-11-07 MED ORDER — SODIUM CHLORIDE 0.9 % IV SOLN: 250.0000 mL | INTRAVENOUS | Status: DC | PRN

## 2021-11-07 MED ORDER — HEPARIN SODIUM (PORCINE) 1000 UNIT/ML IJ SOLN
INTRAMUSCULAR | Status: AC
  Filled 2021-11-07: qty 10

## 2021-11-07 MED ORDER — SODIUM CHLORIDE 0.9 % WEIGHT BASED INFUSION
1.0000 mL/kg/h | INTRAVENOUS | Status: DC
  Administered 2021-11-07 (×2): 250 mL via INTRAVENOUS

## 2021-11-07 MED ORDER — FENTANYL CITRATE (PF) 100 MCG/2ML IJ SOLN
INTRAMUSCULAR | Status: DC | PRN
Start: 2021-11-07 — End: 2021-11-07
  Administered 2021-11-07 (×3): 25 ug via INTRAVENOUS

## 2021-11-07 MED ORDER — CARVEDILOL 25 MG PO TABS
25.0000 mg | ORAL_TABLET | Freq: Two times a day (BID) | ORAL | Status: DC
  Administered 2021-11-07 – 2021-11-08 (×2): 25 mg via ORAL
  Filled 2021-11-07 (×2): qty 1

## 2021-11-07 MED ORDER — SODIUM CHLORIDE 0.9% FLUSH
3.0000 mL | Freq: Two times a day (BID) | INTRAVENOUS | Status: DC
  Administered 2021-11-07 – 2021-11-08 (×2): 3 mL via INTRAVENOUS

## 2021-11-07 MED ORDER — HYDROCHLOROTHIAZIDE 25 MG PO TABS
25.0000 mg | ORAL_TABLET | Freq: Every day | ORAL | Status: DC
  Administered 2021-11-08: 25 mg via ORAL
  Filled 2021-11-07: qty 1

## 2021-11-07 MED ORDER — ONDANSETRON HCL 4 MG/2ML IJ SOLN
INTRAMUSCULAR | Status: AC
  Filled 2021-11-07: qty 2

## 2021-11-07 MED ORDER — HYDRALAZINE HCL 20 MG/ML IJ SOLN
5.0000 mg | INTRAMUSCULAR | Status: DC | PRN
  Administered 2021-11-08: 5 mg via INTRAVENOUS
  Filled 2021-11-07 (×2): qty 1

## 2021-11-07 MED ORDER — ONDANSETRON HCL 4 MG/2ML IJ SOLN: 4.0000 mg | Freq: Four times a day (QID) | INTRAMUSCULAR | Status: DC | PRN

## 2021-11-07 MED ORDER — ALBUTEROL SULFATE (2.5 MG/3ML) 0.083% IN NEBU
2.5000 mg | INHALATION_SOLUTION | RESPIRATORY_TRACT | Status: DC | PRN
Start: 2021-11-07 — End: 2021-11-08

## 2021-11-07 MED ORDER — INSULIN ASPART 100 UNIT/ML IJ SOLN
0.0000 [IU] | Freq: Three times a day (TID) | INTRAMUSCULAR | Status: DC
  Administered 2021-11-08: 3 [IU] via SUBCUTANEOUS

## 2021-11-07 MED ORDER — MORPHINE SULFATE (PF) 2 MG/ML IV SOLN
INTRAVENOUS | Status: AC
  Filled 2021-11-07: qty 1

## 2021-11-07 MED ORDER — HEPARIN (PORCINE) IN NACL 1000-0.9 UT/500ML-% IV SOLN
INTRAVENOUS | Status: AC
  Filled 2021-11-07: qty 1000

## 2021-11-07 MED ORDER — MIDAZOLAM HCL 2 MG/2ML IJ SOLN
INTRAMUSCULAR | Status: AC
  Filled 2021-11-07: qty 2

## 2021-11-07 MED ORDER — SODIUM CHLORIDE 0.9 % IV SOLN: INTRAVENOUS | Status: AC

## 2021-11-07 MED ORDER — ASPIRIN 81 MG PO CHEW: 81.0000 mg | CHEWABLE_TABLET | ORAL | Status: DC

## 2021-11-07 MED ORDER — LIDOCAINE HCL (PF) 1 % IJ SOLN
INTRAMUSCULAR | Status: AC
  Filled 2021-11-07: qty 30

## 2021-11-07 SURGICAL SUPPLY — 27 items
BAG SNAP BAND KOVER 36X36 (MISCELLANEOUS) ×1 IMPLANT
BALLN COYOTE OTW 2.5X60X150 (BALLOONS) ×3
BALLN COYOTE OTW 3X80X150 (BALLOONS) ×3
BALLN IN.PACT DCB 5X120 (BALLOONS) ×3
BALLN IN.PACT DCB 5X80 (BALLOONS) ×3
BALLOON COYOTE OTW 2.5X60X150 (BALLOONS) IMPLANT
BALLOON COYOTE OTW 3X80X150 (BALLOONS) IMPLANT
CATH ANGIO 5F PIGTAIL 65CM (CATHETERS) ×1 IMPLANT
CATH CROSS OVER TEMPO 5F (CATHETERS) ×1 IMPLANT
CATH HAWKONE LX EXTENDED TIP (CATHETERS) ×1 IMPLANT
CATH STRAIGHT 5FR 65CM (CATHETERS) ×1 IMPLANT
DCB IN.PACT 5X120 (BALLOONS) IMPLANT
DCB IN.PACT 5X80 (BALLOONS) IMPLANT
DEVICE SPIDERFX EMB PROT 6MM (WIRE) ×1 IMPLANT
KIT ENCORE 26 ADVANTAGE (KITS) ×1 IMPLANT
KIT PV (KITS) ×3 IMPLANT
SHEATH HIGHFLEX ANSEL 7FR 55CM (SHEATH) ×1 IMPLANT
SHEATH PINNACLE 7F 10CM (SHEATH) ×1 IMPLANT
SHEATH PINNACLE MP 7F 45CM (SHEATH) ×1 IMPLANT
SHEATH PROBE COVER 6X72 (BAG) ×1 IMPLANT
SYR MEDRAD MARK 7 150ML (SYRINGE) ×3 IMPLANT
TAPE SHOOT N SEE (TAPE) ×1 IMPLANT
TRANSDUCER W/STOPCOCK (MISCELLANEOUS) ×3 IMPLANT
TRAY PV CATH (CUSTOM PROCEDURE TRAY) ×3 IMPLANT
WIRE HITORQ VERSACORE ST 145CM (WIRE) ×1 IMPLANT
WIRE ROSEN-J .035X180CM (WIRE) ×1 IMPLANT
WIRE SHEPHERD 4G .014 (WIRE) ×1 IMPLANT

## 2021-11-07 NOTE — Interval H&P Note (Signed)
History and Physical Interval Note: ? ?11/07/2021 ?10:03 AM ? ?Rachael Becker  has presented today for surgery, with the diagnosis of PAD.  The various methods of treatment have been discussed with the patient and family. After consideration of risks, benefits and other options for treatment, the patient has consented to  Procedure(s): ?ABDOMINAL AORTOGRAM W/LOWER EXTREMITY (N/A) as a surgical intervention.  The patient's history has been reviewed, patient examined, no change in status, stable for surgery.  I have reviewed the patient's chart and labs.  Questions were answered to the patient's satisfaction.   ? ? ?Nanetta Batty ? ? ?

## 2021-11-07 NOTE — Progress Notes (Signed)
Site area: left groin ?Site Prior to Removal:  Level 1 ?Pressure Applied For: 25 minutes ?Manual:   yes ?Patient Status During Pull:  stable ?Post Pull Site:  Level 1 puffy, soft, faint localized bruising ?Post Pull Instructions Given:  yes ?Post Pull Pulses Present: dopplered ?Dressing Applied:  gauze and tegaderm ?Bedrest begins @ 1450 ?Comments: ?  ?

## 2021-11-08 ENCOUNTER — Encounter (HOSPITAL_COMMUNITY): Payer: Self-pay | Admitting: Cardiovascular Disease

## 2021-11-08 DIAGNOSIS — I739 Peripheral vascular disease, unspecified: Secondary | ICD-10-CM | POA: Diagnosis not present

## 2021-11-08 DIAGNOSIS — I25709 Atherosclerosis of coronary artery bypass graft(s), unspecified, with unspecified angina pectoris: Secondary | ICD-10-CM

## 2021-11-08 DIAGNOSIS — E782 Mixed hyperlipidemia: Secondary | ICD-10-CM

## 2021-11-08 DIAGNOSIS — I10 Essential (primary) hypertension: Secondary | ICD-10-CM | POA: Diagnosis not present

## 2021-11-08 DIAGNOSIS — E1151 Type 2 diabetes mellitus with diabetic peripheral angiopathy without gangrene: Secondary | ICD-10-CM | POA: Diagnosis not present

## 2021-11-08 LAB — CBC
HCT: 40.4 % (ref 36.0–46.0)
Hemoglobin: 12.7 g/dL (ref 12.0–15.0)
MCH: 27.4 pg (ref 26.0–34.0)
MCHC: 31.4 g/dL (ref 30.0–36.0)
MCV: 87.3 fL (ref 80.0–100.0)
Platelets: 187 10*3/uL (ref 150–400)
RBC: 4.63 MIL/uL (ref 3.87–5.11)
RDW: 15.9 % — ABNORMAL HIGH (ref 11.5–15.5)
WBC: 9.5 10*3/uL (ref 4.0–10.5)
nRBC: 0 % (ref 0.0–0.2)

## 2021-11-08 LAB — LIPID PANEL
Cholesterol: 205 mg/dL — ABNORMAL HIGH (ref 0–200)
HDL: 46 mg/dL (ref >40)
LDL Cholesterol: 142 mg/dL — ABNORMAL HIGH (ref 0–99)
Total CHOL/HDL Ratio: 4.5 RATIO
Triglycerides: 83 mg/dL (ref <150)
VLDL: 17 mg/dL (ref 0–40)

## 2021-11-08 LAB — COMPREHENSIVE METABOLIC PANEL
ALT: 10 U/L (ref 0–44)
AST: 12 U/L — ABNORMAL LOW (ref 15–41)
Albumin: 3 g/dL — ABNORMAL LOW (ref 3.5–5.0)
Alkaline Phosphatase: 53 U/L (ref 38–126)
Anion gap: 4 — ABNORMAL LOW (ref 5–15)
BUN: 13 mg/dL (ref 6–20)
CO2: 23 mmol/L (ref 22–32)
Calcium: 8.6 mg/dL — ABNORMAL LOW (ref 8.9–10.3)
Chloride: 113 mmol/L — ABNORMAL HIGH (ref 98–111)
Creatinine, Ser: 0.78 mg/dL (ref 0.44–1.00)
GFR, Estimated: >60 mL/min (ref >60)
Glucose, Bld: 139 mg/dL — ABNORMAL HIGH (ref 70–99)
Potassium: 4.2 mmol/L (ref 3.5–5.1)
Sodium: 140 mmol/L (ref 135–145)
Total Bilirubin: 0.4 mg/dL (ref 0.3–1.2)
Total Protein: 5.5 g/dL — ABNORMAL LOW (ref 6.5–8.1)

## 2021-11-08 LAB — GLUCOSE, CAPILLARY: Glucose-Capillary: 182 mg/dL — ABNORMAL HIGH (ref 70–99)

## 2021-11-08 MED ORDER — APIXABAN 5 MG PO TABS
5.0000 mg | ORAL_TABLET | Freq: Two times a day (BID) | ORAL | Status: DC
  Administered 2021-11-08: 5 mg via ORAL
  Filled 2021-11-08: qty 1

## 2021-11-08 MED ORDER — ROSUVASTATIN CALCIUM 5 MG PO TABS: 5.0000 mg | ORAL_TABLET | Freq: Every day | ORAL | 11 refills | Status: DC

## 2021-11-08 MED ORDER — CLOPIDOGREL BISULFATE 75 MG PO TABS: 75.0000 mg | ORAL_TABLET | Freq: Every day | ORAL | 3 refills | Status: DC

## 2021-11-08 NOTE — Progress Notes (Signed)
? ?Progress Note ? ?Patient Name: Rachael Becker ?Date of Encounter: 11/08/2021 ? ?[facility] Cardiologist: None  ? ?Subjective  ? ?Denies any CP or SOB.  ? ?Inpatient Medications  ?  ?Scheduled Meds: ? aspirin EC  81 mg Oral Daily  ? carvedilol  25 mg Oral BID  ? clopidogrel  75 mg Oral Q breakfast  ? hydrochlorothiazide  25 mg Oral Daily  ? insulin aspart  0-15 Units Subcutaneous TID WC  ? losartan  100 mg Oral Daily  ? pneumococcal 20-valent conjugate vaccine  0.5 mL Intramuscular Tomorrow-1000  ? sodium chloride flush  3 mL Intravenous Q12H  ? ?Continuous Infusions: ? sodium chloride    ? ?PRN Meds: ?sodium chloride, acetaminophen, albuterol, hydrALAZINE, labetalol, morphine injection, ondansetron (ZOFRAN) IV, sodium chloride flush  ? ?Vital Signs  ?  ?Vitals:  ? 11/08/21 [id number] 11/08/21 [id number] 11/08/21 [id number] 11/08/21 0905  ?BP: (!) 192/95 (!) 182/81 (!) 162/81 (!) 158/80  ?Pulse: 84  74 69  ?Resp: 16 11 (!) 21 16  ?Temp: 98.1 ?F (36.7 ?C)     ?TempSrc: Oral     ?SpO2: 94%  96%   ?Weight:      ?Height:      ? ? ?Intake/Output Summary (Last 24 hours) at 11/08/2021 0958 ?Last data filed at 11/08/2021 0800 ?Gross per 24 hour  ?Intake 1447.5 ml  ?Output 400 ml  ?Net 1047.5 ml  ? ? ?  11/07/2021  ?  7:48 AM 10/23/2021  ? 10:58 AM 07/16/2021  ?  9:44 AM  ?Last 3 Weights  ?Weight (lbs) 204 lb 201 lb 205 lb  ?Weight (kg) 92.534 kg 91.173 kg 92.987 kg  ?   ? ?Telemetry  ?  ?NSR without significant ventricular ectopy - Personally Reviewed ? ?ECG  ?  ?No EKG during this admission - Personally Reviewed ? ?Physical Exam  ? ?GEN: No acute distress.   ?Neck: No JVD ?Cardiac: RRR, no murmurs, rubs, or gallops.  ?Respiratory: Clear to auscultation bilaterally. ?GI: Soft, nontender, non-distended  ?MS: No edema; No deformity. ?Neuro:  Nonfocal  ?Psych: Normal affect  ? ?Labs  ?  ?High Sensitivity Troponin:  No results for input(s): TROPONINIHS in the last 720 hours.   ?Chemistry ?Recent Labs  ?Lab 11/08/21 ?0133  ?NA 140  ?K 4.2  ?CL  113*  ?CO2 23  ?GLUCOSE 139*  ?BUN 13  ?CREATININE 0.78  ?CALCIUM 8.6*  ?PROT 5.5*  ?ALBUMIN 3.0*  ?AST 12*  ?ALT 10  ?ALKPHOS 53  ?BILITOT 0.4  ?GFRNONAA >60  ?ANIONGAP 4*  ?  ?Lipids  ?Recent Labs  ?Lab 11/08/21 ?0133  ?CHOL 205*  ?TRIG 83  ?HDL 46  ?LDLCALC 142*  ?CHOLHDL 4.5  ?  ?Hematology ?Recent Labs  ?Lab 11/08/21 ?0133  ?WBC 9.5  ?RBC 4.63  ?HGB 12.7  ?HCT 40.4  ?MCV 87.3  ?MCH 27.4  ?MCHC 31.4  ?RDW 15.9*  ?PLT 187  ? ?Thyroid No results for input(s): TSH, FREET4 in the last 168 hours.  ?BNPNo results for input(s): BNP, PROBNP in the last 168 hours.  ?DDimer No results for input(s): DDIMER in the last 168 hours.  ? ?Radiology  ?  ?PERIPHERAL VASCULAR CATHETERIZATION ? ?Result Date: 11/07/2021 ?Images from the original result were not included.  [id number] LOCATION:  FACILITY: [facility] PHYSICIAN: [name], M.D. Mar 30, 1966 DATE OF PROCEDURE:  11/07/2021 DATE OF DISCHARGE: PV Angiogram/Intervention History obtained from chart review.[name] is a 56 y.o.  moderately overweight divorced African-American female mother of  2, grandmother of 2 grandchildren who is disabled because of prior bypass surgery and back surgery.  She was referred by Geni Bers for evaluation of symptomatic PAD.  I last saw her in the office 07/16/2021.  She has a history of CAD status post CABG x[facility] at [facility] in 2016.  She has had back surgery as well.  Her cardiovascular risk factor profile is notable for greater than 60 pack years of tobacco abuse currently smoking a pack a day with the desire to stop.  She apparently is wearing a NicoDerm patch.  She history of hypertension, hyperlipidemia and diabetes.  She is never had a heart attack or stroke.  Her father did have CAD.  She denies chest pain or shortness of breath.  She also has PAF currently in sinus rhythm on Eliquis oral anticoagulation.  She is symptomatic claudication for the last 5 or 6 years right greater than left with recent Doppler studies  performed by her referring cardiologist revealing ABIs in the 0.5 range with moderate SFA disease and potential occluded left SFA. Since I saw her 3 months ago we did get lower extremity arterial Doppler studies on 08/05/2021 revealing a right ABI of 0.72, left of 0.68.  She did have moderate right SFA disease and an occluded right anterior tibial as well as an occluded left SFA and anterior tibial.  She does complain of claudication although her symptoms are somewhat atypical, right greater than left.  She does continue to smoke.  She denies chest pain or shortness of breath.  She presents today for outpatient peripheral angiography and endovascular therapy for lifestyle-limiting claudication. Pre Procedure Diagnosis: Peripheral arterial disease Post Procedure Diagnosis: Peripheral arterial disease Operators: Dr. [name] Procedures Performed:  1.  Ultrasound-guided left common femoral access  2.  Abdominal aortogram/bilateral iliac angiogram/bifemoral runoff  3.  Contralateral access (second-order cath placement)  4.  Placement of right below the knee spider distal protection device             5.  [place] 1 directional atherectomy of the proximal mid and distal right SFA followed by drug-coated balloon angioplasty. PROCEDURE DESCRIPTION: The patient was brought to the second floor [facility] cath lab in the the postabsorptive state. She was premedicated with IV Versed and fentanyl. Her left groin was prepped and shaved in usual sterile fashion. Xylocaine 1% was used for local anesthesia. A 5 French sheath was inserted into the left common femoral artery using standard Seldinger technique.  A 5 French pigtail catheter was placed in distal abdominal aorta.  Distal abdominal aortography, bilateral iliac angiography with bifemoral runoff was performed using bolus chase, digital subtraction and step table technique.  Omnipaque dye was used for the entirety of the case (240 cc total contrast the patient).   Retrograde aortic pressures monitored during the case.  Angiographic Data: 1: Abdominal aorta-renal arteries widely patent.  Infrarenal abdominal aorta is free of significant atherosclerotic changes. 2: Left lower extremity-the left SFA was flush occluded at its origin reconstituting in the mid vessel by profunda femoris collaterals.  There is two-vessel runoff with an occluded anterior tibial 3: Right lower extremity-high-grade segmental proximal, mid and distal right SFA with two-vessel runoff, occluded anterior tibial.  ? ?Ms. Carnevale has a flush occluded left SFA probably not percutaneously addressable and high-grade segmental proximal, mid and distal right SFA which is her more affected extremity.  We will proceed with [facility] 1 directional atherectomy followed by drug-coated balloon angioplasty using spider  distal protection. Procedure Description: Contralateral access was obtained with a crossover catheter and an 035 Rosen wire.  I initially attempted to place an Ansell sheath which I was unable to advance through the common femoral and changed to a destination sheath was exchanged over the [place] wire 45 cm destination sheath which I placed in the distal right external iliac artery. Patient received a total of 13,000's of heparin with an ending ACT of 263.  She did receive 300 mg of p.o. clopidogrel at the end of the case.  Omnipaque dye was used for the entirety of the intervention.  Retroaortic pressures monitored in the case. I was able to traverse the proximal mid and distal disease segments with a 0.146 g [place] wire.  I then placed a 6 mm spider distal protection device in the P3 segment on the right side.  I performed [facility] 1 directional atherectomy followed by [facility] of the proximal segmental disease segment.  The [facility] 1 device would not cross the distal segment and therefore I predilated with a 3 mm x 80 mm balloon which allowed easy passage and directional atherectomy using the LX device followed by DCB.   The patient tolerated procedure well.  The spider distal protection device was then successfully captured and removed from the body and the 7 [place] destination sheath was then removed over the [employer] wire and exc

## 2021-11-08 NOTE — Plan of Care (Signed)
Discharge education discussed with patient, all questions answered.  ?Problem: Education: ?Goal: Knowledge of General Education information will improve ?Description: Including pain rating scale, medication(s)/side effects and non-pharmacologic comfort measures ?Outcome: Adequate for Discharge ?  ?Problem: Activity: ?Goal: Risk for activity intolerance will decrease ?Outcome: Adequate for Discharge ?  ?

## 2021-11-08 NOTE — Discharge Instructions (Signed)
Plan to continue aspirin, plavix and Eliquis for 1 month, then stop aspirin and continue Plavix and Eliquis thereafter.  ? ? ?No lifting over 5 lbs for 1 week. No sexual activity for 1 week. Keep procedure site clean & dry. If you notice increased pain, swelling, bleeding or pus, call/return!  You may shower, but no soaking baths/hot tubs/pools for 1 week.  ? ?

## 2021-11-08 NOTE — Discharge Summary (Addendum)
?Discharge Summary  ?  ?Patient ID: Rachael Becker ?MRN: [id number]; DOB: [birth year] ? ?Admit date: 11/07/2021 ?Discharge date: 11/08/2021 ? ?PCP:  System, Provider Not In ?  ?[facility] Providers ?Cardiologist: [facility] / [name] PA-C ?[place]heart specialist: Dr. Gwenlyn Found ? ? ?Discharge Diagnoses  ?  ?Principal Problem: ?  Claudication in peripheral vascular disease ([place]) ?Active Problems: ?  Essential hypertension, benign ?  Hyperlipidemia ?  Coronary artery disease involving coronary bypass graft of native heart with angina pectoris ([place]) ? ?Diagnostic Studies/Procedures  ?  ?LE angiography 11/07/2021 ?Angiographic Data:  ?  ?1: Abdominal aorta-renal arteries widely patent.  Infrarenal abdominal aorta is free of significant atherosclerotic changes. ?2: Left lower extremity-the left SFA was flush occluded at its origin reconstituting in the mid vessel by profunda femoris collaterals.  There is two-vessel runoff with an occluded anterior tibial ?3: Right lower extremity-high-grade segmental proximal, mid and distal right SFA with two-vessel runoff, occluded anterior tibial. ?  ?IMPRESSION: Rachael Becker has a flush occluded left SFA probably not percutaneously addressable and high-grade segmental proximal, mid and distal right SFA which is her more affected extremity.  We will proceed with [facility] 1 directional atherectomy followed by drug-coated balloon angioplasty using spider distal protection. ?  ?Final Impression: Successful Hawk 1 directional atherectomy followed by drug-coated balloon angioplasty using spider distal protection of high-grade segmental proximal, mid and distal right SFA stenosis.  The left SFA is not treatable endovascularly because it is flush occluded.  She will need "triple therapy" with low-dose aspirin, clopidogrel and Eliquis for 30 days after which the aspirin can be discontinued.  The sheath will be removed once ACT falls below 170 and pressure held.  The patient will be hydrated  overnight, discharged home in the morning.  We will get lower extremity arterial Doppler studies in our [facility] line office next week and I will see her back 1 to 2 weeks thereafter.   ?_____________ ?  ?History of Present Illness   ?  ?Rachael Becker is a 56 y.o. female with symptomatic PAD.  I last saw her in the office 07/16/2021.  She has a history of CAD status post CABG x4 at [facility] in 2016.  She has had back surgery as well.  Her cardiovascular risk factor profile is notable for greater than 60 pack years of tobacco abuse currently smoking a pack a day with the desire to stop.  She apparently is wearing a NicoDerm patch.  She history of hypertension, hyperlipidemia and diabetes.  She is never had a heart attack or stroke.  Her father did have CAD.  She denies chest pain or shortness of breath.  She also has PAF currently in sinus rhythm on Eliquis oral anticoagulation.  She is symptomatic claudication for the last 5 or 6 years right greater than left with recent Doppler studies performed by her referring cardiologist revealing ABIs in the 0.5 range with moderate SFA disease and potential occluded left SFA. ? ?Since I saw her 3 months ago we did get lower extremity arterial Doppler studies on 08/05/2021 revealing a right ABI of 0.72, left of 0.68.  She did have moderate right SFA disease and an occluded right anterior tibial as well as an occluded left SFA and anterior tibial.  She does complain of claudication although her symptoms are somewhat atypical, right greater than left.  She does continue to smoke.  She denies chest pain or shortness of breath. ? ?Hospital Course  ?   ?  Consultants: N/A  ? ?Patient presented to [facility] on 11/07/2021 for the planned procedure of lower extremity angiography.  This revealed occluded L SFA with reconstitution in mid vessel by profunda femoris collaterals, 2v runoff with occluded anterior tibial. High grade prox, mid and diastol R SFA with two  vessel runoff, occluded anterior tibial. Underwent successful [facility] 1 directional atherectomy followed by drug coated balloon angioplasty of prox, mid and diastal R SFA. The left SFA disease is not amenable to PCI as it is flush occlded.  Postprocedure, patient was placed on triple therapy of aspirin, Plavix and Eliquis.  Home Prilosec has been discontinued due to interaction with Plavix.  The plan is to continue triple therapy for 1 month, after which time aspirin will be discontinued and she will be on Plavix and Eliquis afterward.  Recent blood work showed significantly elevated LDL, patient apparently has been off of Repatha due to cost.  The case was discussed with our clinical pharmacist who recommended addition of 5 mg daily of Crestor.  A message has been sent to our clinical pharmacist in the office to help assist the patient in obtaining medication assistance for Repatha or alternative therapy. ? ?Patient was seen in the morning of 11/08/2021 at which time she was doing well.  Eliquis has been restarted.  Patient will proceed with outpatient lower extremity Doppler in 1 week and follow-up with Dr. Gwenlyn Found afterward. ? ? ?Did the patient have an acute coronary syndrome (MI, NSTEMI, STEMI, etc) this admission?:  No                               ?Did the patient have a percutaneous coronary intervention (stent / angioplasty)?:  No ? ?  ?_____________ ? ?Discharge Vitals ?Blood pressure (!) 158/80, pulse 69, temperature 98.1 ?F (36.7 ?C), temperature source Oral, resp. rate 16, height 5\' 5"  (1.651 m), weight 92.5 kg, SpO2 96 %.  Danley Danker Weights  ? 11/07/21 0748  ?Weight: 92.5 kg  ?General appearance: alert, cooperative, no distress, and morbidly obese ?Neck: no JVD ?Lungs: clear to auscultation bilaterally and normal percussion bilaterally ?Heart: regular rate and rhythm, S1, S2 normal, no murmur, click, rub or gallop and normal apical impulse ?Abdomen: soft, non-tender; bowel sounds normal; no masses,  no  organomegaly and obese ?Extremities: extremities normal, atraumatic, no cyanosis or edema ?Pulses: 2+ and symmetric ?Minimally palpable L pedal pulses - more 1+ R pedal pulses ?Neurologic: Grossly normal ? ? ?Labs & Radiologic Studies  ?  ?CBC ?Recent Labs  ?  11/08/21 ?0133  ?WBC 9.5  ?HGB 12.7  ?HCT 40.4  ?MCV 87.3  ?PLT 187  ? ?Basic Metabolic Panel ?Recent Labs  ?  11/08/21 ?0133  ?NA 140  ?K 4.2  ?CL 113*  ?CO2 23  ?GLUCOSE 139*  ?BUN 13  ?CREATININE 0.78  ?CALCIUM 8.6*  ? ?Liver Function Tests ?Recent Labs  ?  11/08/21 ?0133  ?AST 12*  ?ALT 10  ?ALKPHOS 53  ?BILITOT 0.4  ?PROT 5.5*  ?ALBUMIN 3.0*  ? ?No results for input(s): LIPASE, AMYLASE in the last 72 hours. ?High Sensitivity Troponin:   ?No results for input(s): TROPONINIHS in the last 720 hours.  ?BNP ?Invalid input(s): POCBNP ?D-Dimer ?No results for input(s): DDIMER in the last 72 hours. ?Hemoglobin A1C ?No results for input(s): HGBA1C in the last 72 hours. ?Fasting Lipid Panel ?Recent Labs  ?  11/08/21 ?0133  ?CHOL 205*  ?HDL 46  ?  LDLCALC 142*  ?TRIG 83  ?CHOLHDL 4.5  ? ?Thyroid Function Tests ?No results for input(s): TSH, T4TOTAL, T3FREE, THYROIDAB in the last 72 hours. ? ?Invalid input(s): FREET3 ?_____________  ?PERIPHERAL VASCULAR CATHETERIZATION ? ?Result Date: 11/07/2021 ?Images from the original result were not included.  GS:[id number] LOCATION:  FACILITY: [facility] PHYSICIAN: [name], M.D. 08/01/1965 DATE OF PROCEDURE:  11/07/2021 DATE OF DISCHARGE: PV Angiogram/Intervention History obtained from chart review.Rachael Becker is a 56 y.o.  moderately overweight divorced African-American female mother of 2, grandmother of 2 grandchildren who is disabled because of prior bypass surgery and back surgery.  She was referred by Geni Bers for evaluation of symptomatic PAD.  I last saw her in the office 07/16/2021.  She has a history of CAD status post CABG x4 at [facility] in 2016.  She has had back surgery as well.  Her cardiovascular  risk factor profile is notable for greater than 60 pack years of tobacco abuse currently smoking a pack a day with the desire to stop.  She apparently is wearing a NicoDerm patch.  She history of hypertension, hyperl

## 2021-11-15 ENCOUNTER — Ambulatory Visit (HOSPITAL_COMMUNITY)
Admission: RE | Admit: 2021-11-15 | Discharge: 2021-11-15 | Disposition: A | Discharge status: Home / Self Care | Payer: Medicare HMO | Source: Ambulatory Visit | Attending: Cardiology | Admitting: Cardiology

## 2021-11-15 ENCOUNTER — Encounter (HOSPITAL_COMMUNITY): Payer: Medicare HMO

## 2021-11-15 DIAGNOSIS — I739 Peripheral vascular disease, unspecified: Secondary | ICD-10-CM | POA: Diagnosis present

## 2021-11-29 ENCOUNTER — Ambulatory Visit: Payer: Medicare HMO | Admitting: Cardiovascular Disease

## 2021-12-04 ENCOUNTER — Ambulatory Visit: Payer: Medicare HMO

## 2021-12-04 NOTE — Progress Notes (Deleted)
12/04/2021 Rachael Becker [birth year] [id number]   HPI:  Rachael Becker is a 56 y.o. female patient of Dr Allyson Sabal, who presents today for a lipid clinic evaluation.  See pertinent past medical history below.  Past Medical History: CAD S/P CABG x 4 (2016)  PAD ABI - right 0.72, left 0.68; occluded left and right anterior tibial, let SFA  HTN Controlled on carvedilol, losartan, hctz  DM2 Last A1c 2 yrs ago, at 7.9; On Rybelsus 3 mg  AF PAF at last visit was in SR; on Eliquis, CHADS2-VASc 4  tobacco Continues to smoke      Current Medications: rosuvastatin 5 mg daily  Cholesterol Goals: LDL < 55   Intolerant/previously tried:  Family history:   Diet:   Exercise:    Labs:    Current Outpatient Medications  Medication Sig Dispense Refill   albuterol (VENTOLIN HFA) 108 (90 Base) MCG/ACT inhaler Inhale 2 puffs into the lungs every 4 (four) hours as needed (COPD).     ALPRAZolam (XANAX) 1 MG tablet Take 1 tablet (1 mg total) by mouth 2 (two) times daily as needed for anxiety. (Patient taking differently: Take 1 mg by mouth 3 (three) times daily as needed for anxiety.) 60 tablet 0   aspirin 81 MG tablet Take 81 mg by mouth every evening.      BREO ELLIPTA 100-25 MCG/ACT AEPB Inhale 1 puff into the lungs daily.     buPROPion ER (WELLBUTRIN SR) 100 MG 12 hr tablet Take 100 mg by mouth daily.     carvedilol (COREG) 25 MG tablet Take 25 mg by mouth 2 (two) times daily.  0   clopidogrel (PLAVIX) 75 MG tablet Take 1 tablet (75 mg total) by mouth daily with breakfast. 90 tablet 3   ELIQUIS 5 MG TABS tablet Take 1 tablet (5 mg total) by mouth 2 (two) times daily. 60 tablet 2   hydrochlorothiazide (HYDRODIURIL) 25 MG tablet Take 1 tablet (25 mg total) by mouth daily. (Patient taking differently: Take 12.5-25 mg by mouth 3 (three) times daily as needed (fluid).) 90 tablet 1   losartan (COZAAR) 100 MG tablet Take 100 mg by mouth daily.  1   methocarbamol (ROBAXIN) 500 MG tablet Take 1 tablet  (500 mg total) by mouth 2 (two) times daily. (Patient taking differently: Take 750 mg by mouth 4 (four) times daily.) 20 tablet 0   ondansetron (ZOFRAN ODT) 4 MG disintegrating tablet Take 1 tablet (4 mg total) by mouth every 8 (eight) hours as needed for nausea or vomiting. 6 tablet 0   oxyCODONE-acetaminophen (PERCOCET) 10-325 MG tablet Take 1 tablet by mouth in the morning, at noon, in the evening, and at bedtime.     QUEtiapine (SEROQUEL) 50 MG tablet Take 50 mg by mouth at bedtime.  1   rosuvastatin (CRESTOR) 5 MG tablet Take 1 tablet (5 mg total) by mouth daily. 30 tablet 11   Semaglutide (RYBELSUS) 3 MG TABS Take 3 mg by mouth daily.     venlafaxine XR (EFFEXOR-XR) 75 MG 24 hr capsule Take 1 capsule (75 mg total) by mouth daily with breakfast. (Patient taking differently: Take 300 mg by mouth daily with breakfast.) 30 capsule 3   No current facility-administered medications for this visit.    Allergies  Allergen Reactions   Ace Inhibitors Cough   Naprosyn [Naproxen] Nausea And Vomiting    Past Medical History:  Diagnosis Date   Bipolar disorder (HCC)    Depression    Diabetes mellitus  without complication (HCC)    Dysrhythmia    a fib   Heart disease    History of chicken pox    Hyperlipidemia    Hypertension     There were no vitals taken for this visit.   No problem-specific Assessment & Plan notes found for this encounter.   Phillips Hay PharmD CPP [facility] [address] [place], [place] [address] [phone]

## 2021-12-10 ENCOUNTER — Encounter: Payer: Self-pay | Admitting: Cardiovascular Disease

## 2021-12-10 ENCOUNTER — Ambulatory Visit (INDEPENDENT_AMBULATORY_CARE_PROVIDER_SITE_OTHER): Payer: Medicaid Other | Admitting: Cardiovascular Disease

## 2021-12-10 VITALS — BP 140/96 | HR 77 | Ht 65.0 in | Wt 201.8 lb

## 2021-12-10 DIAGNOSIS — I739 Peripheral vascular disease, unspecified: Secondary | ICD-10-CM | POA: Diagnosis not present

## 2021-12-10 NOTE — Assessment & Plan Note (Signed)
Rachael Becker was sent to me by [name], PA-C for symptomatic PAD.  She had Doppler studies performed 08/05/2021 revealing a right ABI 0.72 and a left of 0.68.  Her claudication was much worse in her right leg than left.  I performed peripheral angiography on her 11/07/2021 revealing a subtotally occluded right SFA with high-grade lesions in the proximal and midportion with two-vessel runoff.  Her left SFA was a CTO beginning at the ostium and reconstituting in the adductor canal with two-vessel runoff.  I performed [facility] 1 directional atherectomy followed by drug-coated balloon angioplasty with excellent result.  ABI normalized.  Her right SFA is widely patent by recent duplex performed 11/15/2021 and her claudication has resolved.  She has stopped smoking.

## 2021-12-10 NOTE — Progress Notes (Signed)
12/10/2021 Rachael Becker   [birth year]  [id number]  Primary Physician Rachael Fanny, DO Primary Cardiologist: Rachael Harp MD FACP, [place], [place], [place]  HPI:  Rachael Becker is a 56 y.o.  moderately overweight divorced African-American female mother of 2, grandmother of 2 grandchildren who is disabled because of prior bypass surgery and back surgery.  She was referred by Rachael Becker for evaluation of symptomatic PAD.  I last saw her in the office 10/23/2021.  She is accompanied by her aunt Rachael Becker today.  Today she has a history of CAD status post CABG x4 at [facility] in 2016.  She has had back surgery as well.  Her cardiovascular risk factor profile is notable for greater than 60 pack years of tobacco abuse currently smoking a pack a day with the desire to stop.  She apparently is wearing a NicoDerm patch.  She history of hypertension, hyperlipidemia and diabetes.  She is never had a heart attack or stroke.  Her father did have CAD.  She denies chest pain or shortness of breath.  She also has PAF currently in sinus rhythm on Eliquis oral anticoagulation.  She is symptomatic claudication for the last 5 or 6 years right greater than left with recent Doppler studies performed by her referring cardiologist revealing ABIs in the 0.5 range with moderate SFA disease and potential occluded left SFA.  I obtained ower extremity arterial Doppler studies on 08/05/2021 revealing a right ABI of 0.72, left of 0.68.  She did have moderate right SFA disease and an occluded right anterior tibial as well as an occluded left SFA and anterior tibial.  She does complain of claudication although her symptoms are somewhat atypical, right greater than left.    She returns today for follow-up.  I performed peripheral angiography on her 5//23 revealing high-grade proximal and mid right SFA stenosis with two-vessel runoff and left SFA CTO beginning at the origin and extending to the adductor canal  where it reconstituted by profunda femoris collaterals.  I performed [facility] 1 directional atherectomy followed by drug-coated balloon angioplasty with excellent result.  Her Dopplers normalized and her claudication has resolved.  She has stopped smoking since I last saw her.  Current Meds  Medication Sig   albuterol (VENTOLIN HFA) 108 (90 Base) MCG/ACT inhaler Inhale 2 puffs into the lungs every 4 (four) hours as needed (COPD).   ALPRAZolam (XANAX) 1 MG tablet Take 1 tablet (1 mg total) by mouth 2 (two) times daily as needed for anxiety. (Patient taking differently: Take 1 mg by mouth 3 (three) times daily as needed for anxiety.)   BREO ELLIPTA 100-25 MCG/ACT AEPB Inhale 1 puff into the lungs daily.   buPROPion ER (WELLBUTRIN SR) 100 MG 12 hr tablet Take 100 mg by mouth daily.   carvedilol (COREG) 25 MG tablet Take 25 mg by mouth 2 (two) times daily.   clopidogrel (PLAVIX) 75 MG tablet Take 1 tablet (75 mg total) by mouth daily with breakfast.   ELIQUIS 5 MG TABS tablet Take 1 tablet (5 mg total) by mouth 2 (two) times daily.   hydrochlorothiazide (HYDRODIURIL) 25 MG tablet Take 1 tablet (25 mg total) by mouth daily. (Patient taking differently: Take 12.5-25 mg by mouth 3 (three) times daily as needed (fluid).)   losartan (COZAAR) 100 MG tablet Take 100 mg by mouth daily.   methocarbamol (ROBAXIN) 500 MG tablet Take 1 tablet (500 mg total) by mouth 2 (two) times daily. (Patient  taking differently: Take 750 mg by mouth 4 (four) times daily.)   oxyCODONE-acetaminophen (PERCOCET) 10-325 MG tablet Take 1 tablet by mouth in the morning, at noon, in the evening, and at bedtime.   QUEtiapine (SEROQUEL) 50 MG tablet Take 50 mg by mouth at bedtime.   rosuvastatin (CRESTOR) 5 MG tablet Take 1 tablet (5 mg total) by mouth daily.   Semaglutide (RYBELSUS) 3 MG TABS Take 3 mg by mouth daily.   venlafaxine XR (EFFEXOR-XR) 75 MG 24 hr capsule Take 1 capsule (75 mg total) by mouth daily with breakfast. (Patient  taking differently: Take 300 mg by mouth daily with breakfast.)     Allergies  Allergen Reactions   Ace Inhibitors Cough   Naprosyn [Naproxen] Nausea And Vomiting    Social History   Socioeconomic History   Marital status: Single    Spouse name: Not on file   Number of children: Not on file   Years of education: Not on file   Highest education level: Not on file  Occupational History   Not on file  Tobacco Use   Smoking status: Every Day    Types: Cigarettes    Last attempt to quit: 07/07/2014    Years since quitting: 7.4   Smokeless tobacco: Never  Vaping Use   Vaping Use: Never used  Substance and Sexual Activity   Alcohol use: Not Currently    Alcohol/week: 10.0 standard drinks    Types: 10 Standard drinks or equivalent per week    Comment: occ   Drug use: No   Sexual activity: Not Currently    Birth control/protection: Surgical  Other Topics Concern   Not on file  Social History Narrative   Not on file   Social Determinants of Health   Financial Resource Strain: Not on file  Food Insecurity: Not on file  Transportation Needs: Not on file  Physical Activity: Not on file  Stress: Not on file  Social Connections: Not on file  Intimate Partner Violence: Not on file     Review of Systems: General: negative for chills, fever, night sweats or weight changes.  Cardiovascular: negative for chest pain, dyspnea on exertion, edema, orthopnea, palpitations, paroxysmal nocturnal dyspnea or shortness of breath Dermatological: negative for rash Respiratory: negative for cough or wheezing Urologic: negative for hematuria Abdominal: negative for nausea, vomiting, diarrhea, bright red blood per rectum, melena, or hematemesis Neurologic: negative for visual changes, syncope, or dizziness All other systems reviewed and are otherwise negative except as noted above.    Blood pressure (!) 140/96, pulse 77, height 5\' 5"  (1.651 m), weight 201 lb 12.8 oz (91.5 kg), SpO2 97 %.   General appearance: alert and no distress Neck: no adenopathy, no carotid bruit, no JVD, supple, symmetrical, trachea midline, and thyroid not enlarged, symmetric, no tenderness/mass/nodules Lungs: clear to auscultation bilaterally Heart: regular rate and rhythm, S1, S2 normal, no murmur, click, rub or gallop Extremities: extremities normal, atraumatic, no cyanosis or edema Pulses: 2+ right pedal pulse Skin: Skin color, texture, turgor normal. No rashes or lesions Neurologic: Grossly normal  EKG not performed today  ASSESSMENT AND PLAN:   Claudication in peripheral vascular disease ([place]) Ms. Tauer was sent to me by Rachael Cash, Becker for symptomatic PAD.  She had Doppler studies performed 08/05/2021 revealing a right ABI 0.72 and a left of 0.68.  Her claudication was much worse in her right leg than left.  I performed peripheral angiography on her 11/07/2021 revealing a subtotally occluded right SFA with  high-grade lesions in the proximal and midportion with two-vessel runoff.  Her left SFA was a CTO beginning at the ostium and reconstituting in the adductor canal with two-vessel runoff.  I performed [facility] 1 directional atherectomy followed by drug-coated balloon angioplasty with excellent result.  ABI normalized.  Her right SFA is widely patent by recent duplex performed 11/15/2021 and her claudication has resolved.  She has stopped smoking.     Rachael Harp MD FACP,FACC,FAHA, [facility] 12/10/2021 1:49 PM

## 2021-12-10 NOTE — Patient Instructions (Signed)
Medication Instructions:  Your physician recommends that you continue on your current medications as directed. Please refer to the Current Medication list given to you today.  *If you need a refill on your cardiac medications before your next appointment, please call your pharmacy*   Testing/Procedures: Your physician has requested that you have a lower extremity arterial duplex. This test is an ultrasound of the arteries in the legs. It looks at arterial blood flow in the legs. Allow one hour for Lower Arterial scans. There are no restrictions or special instructions  Your physician has requested that you have an ankle brachial index (ABI). During this test an ultrasound and blood pressure cuff are used to evaluate the arteries that supply the arms and legs with blood. Allow thirty minutes for this exam. There are no restrictions or special instructions. To be done in November. These procedures will be done at [address] [facility]   Follow-Up: At [facility], you and your health needs are our priority.  As part of our continuing mission to provide you with exceptional heart care, we have created designated Provider Care Teams.  These Care Teams include your primary Cardiologist (physician) and Advanced Practice Providers (APPs -  Physician Assistants and Nurse Practitioners) who all work together to provide you with the care you need, when you need it.  We recommend signing up for the patient portal called "MyChart".  Sign up information is provided on this After Visit Summary.  MyChart is used to connect with patients for Virtual Visits (Telemedicine).  Patients are able to view lab/test results, encounter notes, upcoming appointments, etc.  Non-urgent messages can be sent to your provider as well.   To learn more about what you can do with MyChart, go to [email or web address].    Your next appointment:   12 month(s)  The format for your next appointment:   In Person  Provider:    Nanetta Batty, MD

## 2021-12-26 ENCOUNTER — Encounter: Payer: Self-pay | Admitting: *Deleted

## 2022-02-14 IMAGING — CR DG CHEST 2V
2 series · 2 of 2 positions shown · non-contrast
Comparison: 03/21/2020 radiographs

CLINICAL DATA: Chest tightness starting yesterday. Shortness of
breath

EXAM:
CHEST - 2 VIEW

[w chest pa]
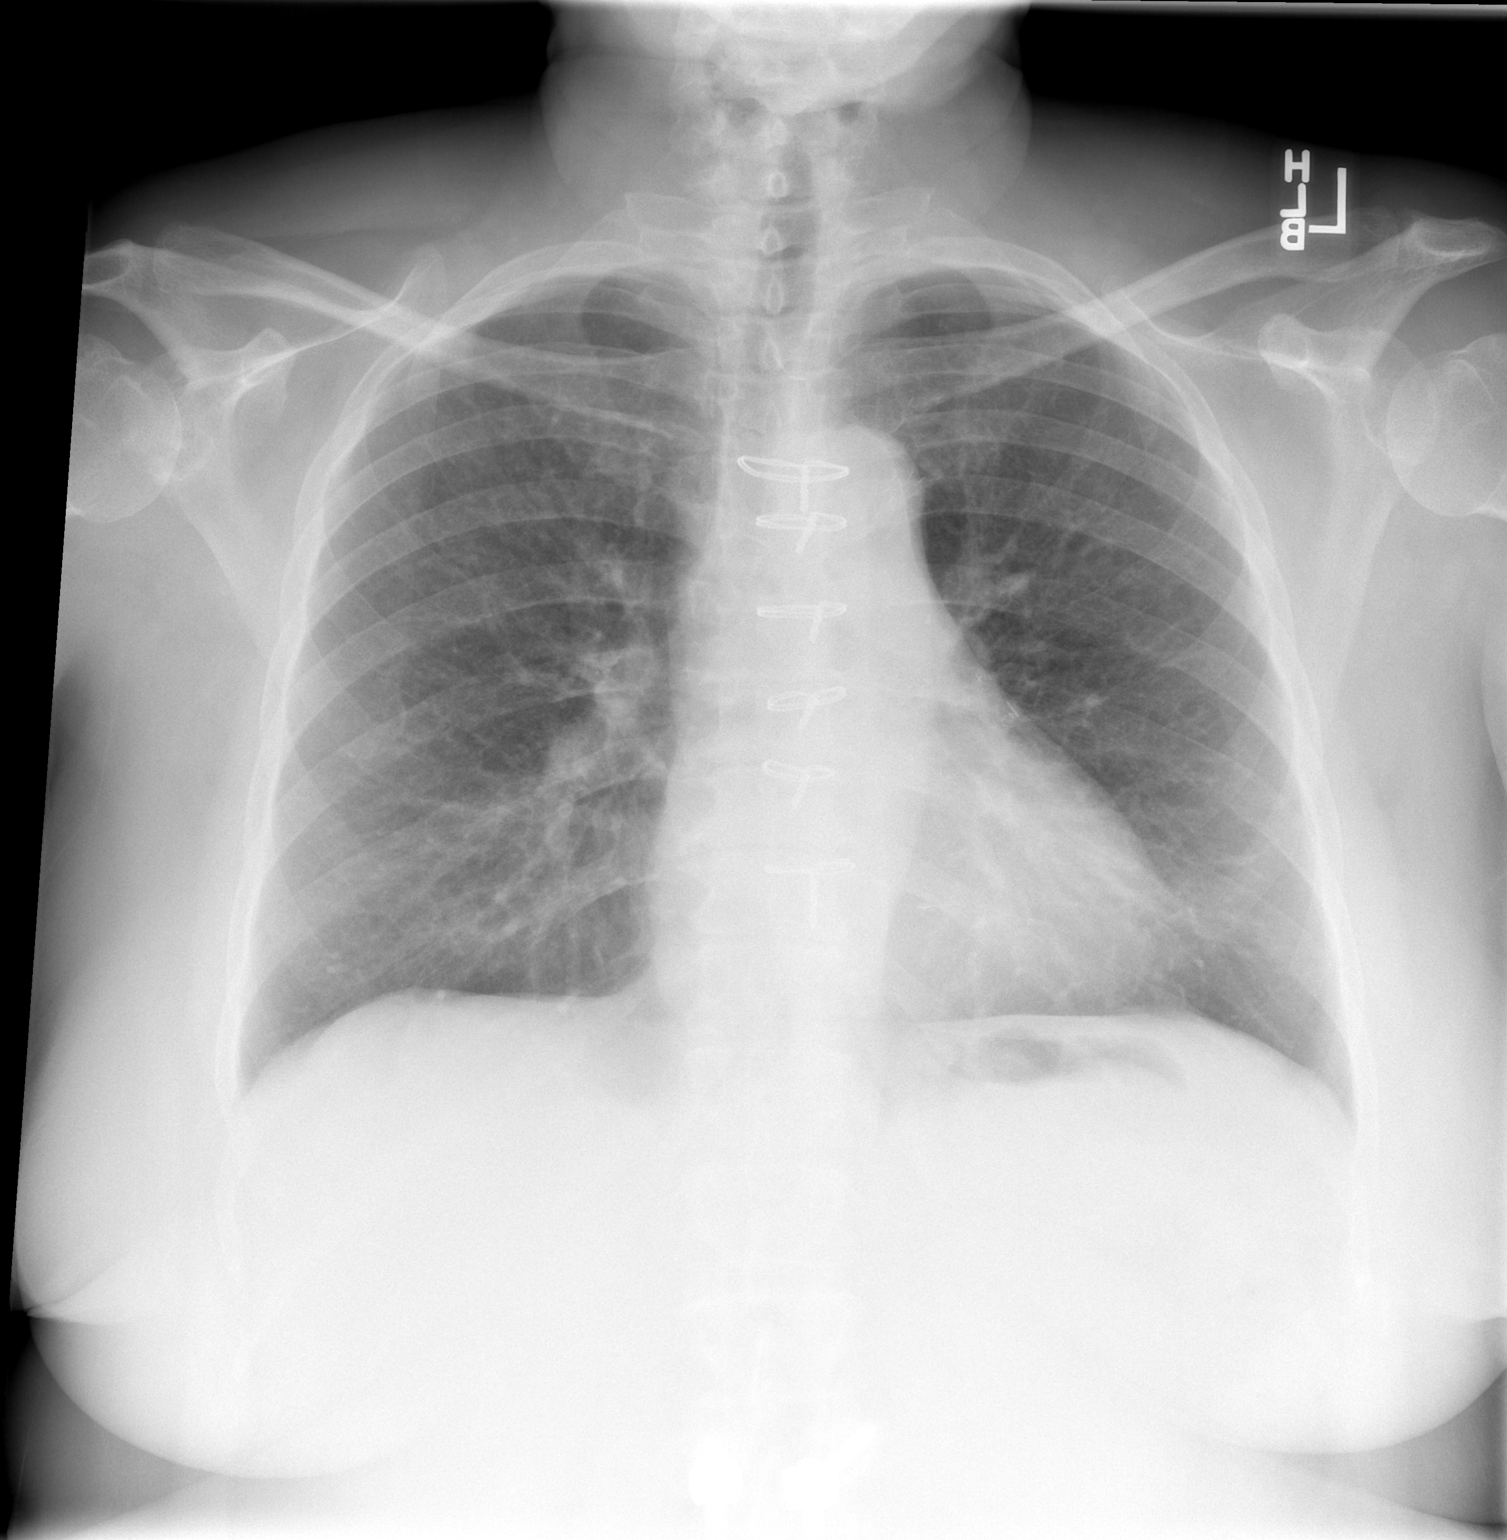

[w chest lat]
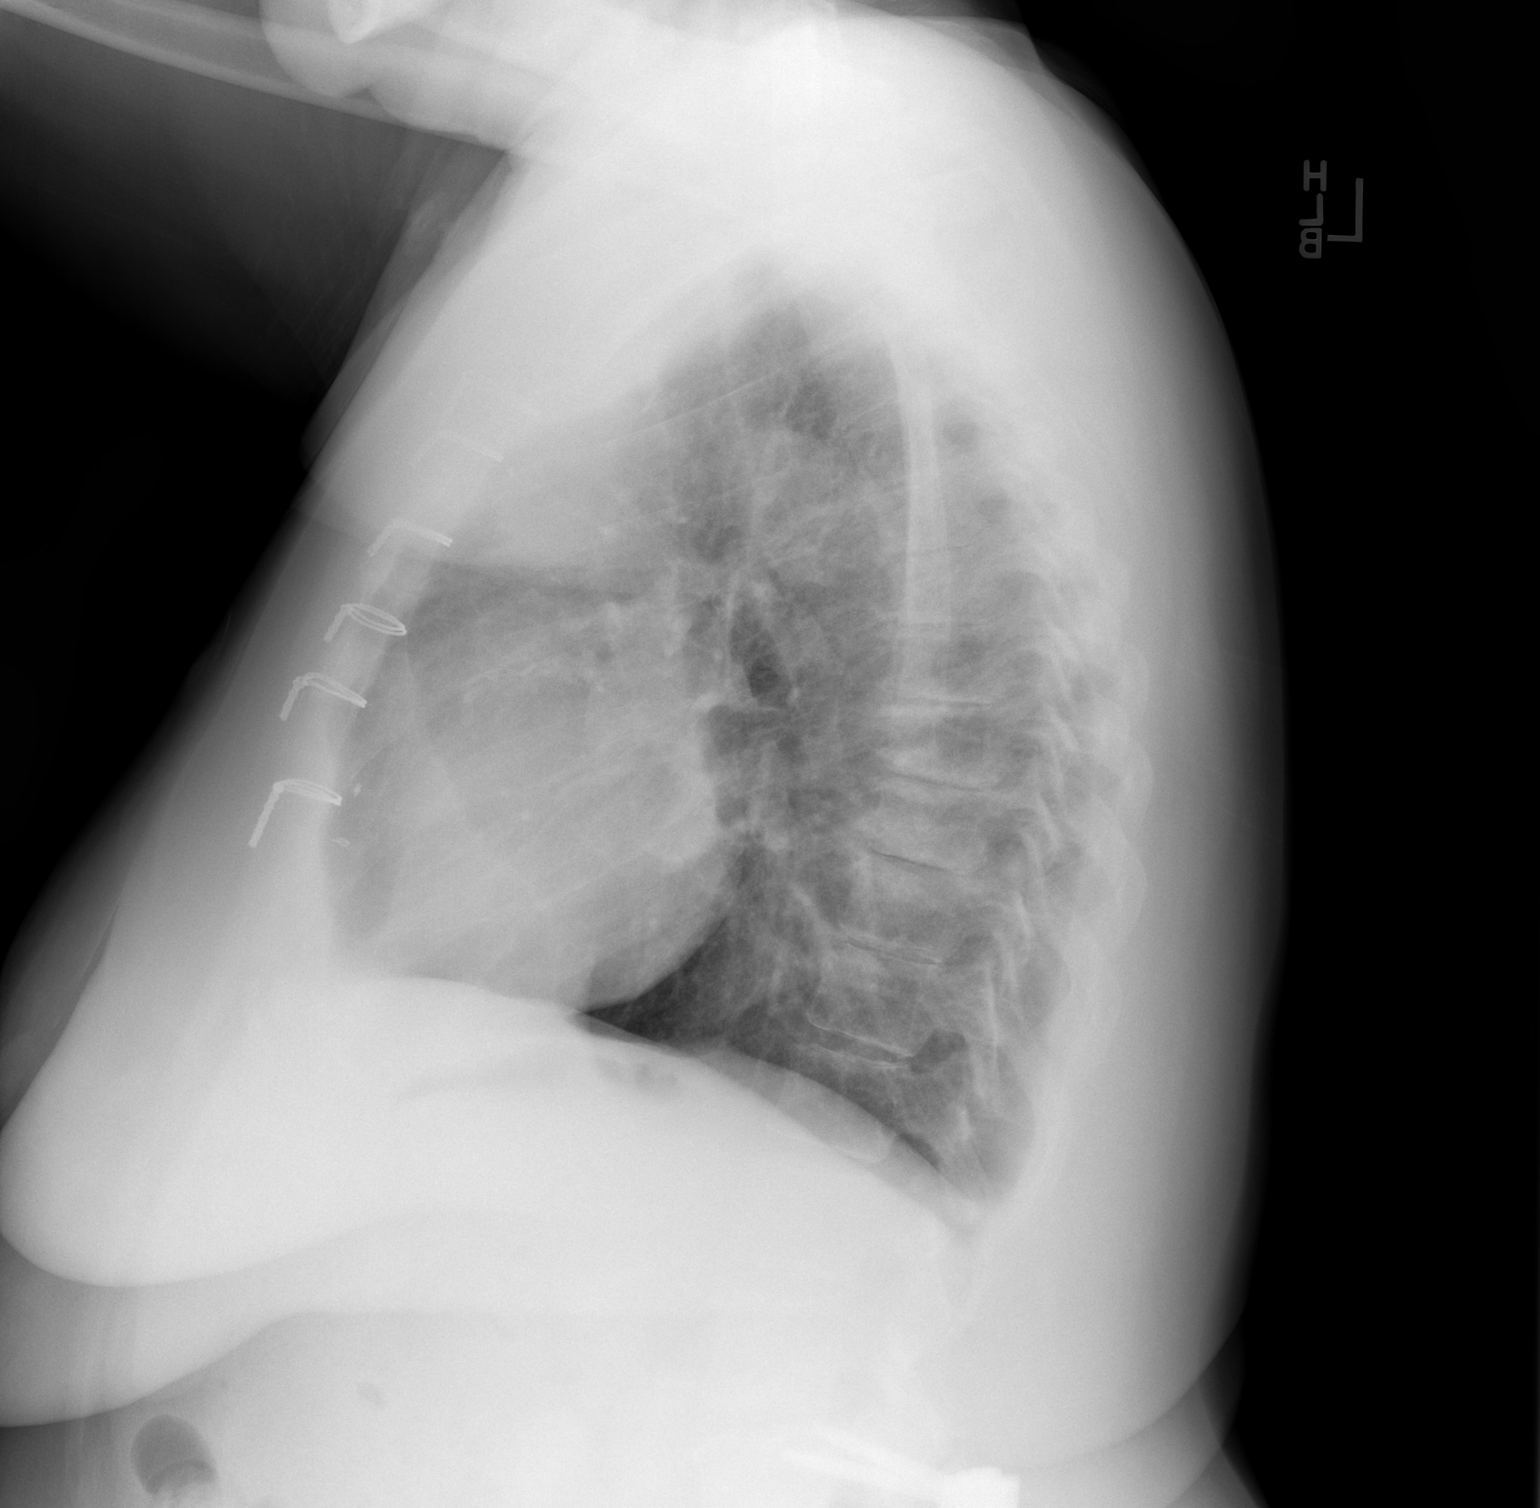

[2 of 2 positions shown; findings below may reference images not displayed]

FINDINGS: Prior median sternotomy and prior CABG. The lungs appear clear.
Thoracic spondylosis. No substantial blunting of the costophrenic
angles.
IMPRESSION: 1.  No active cardiopulmonary disease is radiographically apparent.
2. Prior CABG.

## 2022-05-17 ENCOUNTER — Encounter (HOSPITAL_BASED_OUTPATIENT_CLINIC_OR_DEPARTMENT_OTHER): Payer: Self-pay | Admitting: Emergency Medicine

## 2022-05-17 ENCOUNTER — Emergency Department (HOSPITAL_BASED_OUTPATIENT_CLINIC_OR_DEPARTMENT_OTHER)
Admission: EM | Admit: 2022-05-17 | Discharge: 2022-05-17 | Disposition: A | Discharge status: Home / Self Care | Payer: Medicare Other | Attending: Emergency Medicine | Admitting: Emergency Medicine

## 2022-05-17 ENCOUNTER — Other Ambulatory Visit: Payer: Self-pay

## 2022-05-17 DIAGNOSIS — Z79899 Other long term (current) drug therapy: Secondary | ICD-10-CM | POA: Insufficient documentation

## 2022-05-17 DIAGNOSIS — R0981 Nasal congestion: Secondary | ICD-10-CM | POA: Insufficient documentation

## 2022-05-17 DIAGNOSIS — H9202 Otalgia, left ear: Secondary | ICD-10-CM | POA: Insufficient documentation

## 2022-05-17 DIAGNOSIS — I10 Essential (primary) hypertension: Secondary | ICD-10-CM | POA: Insufficient documentation

## 2022-05-17 DIAGNOSIS — E119 Type 2 diabetes mellitus without complications: Secondary | ICD-10-CM | POA: Diagnosis not present

## 2022-05-17 DIAGNOSIS — Z7902 Long term (current) use of antithrombotics/antiplatelets: Secondary | ICD-10-CM | POA: Diagnosis not present

## 2022-05-17 DIAGNOSIS — Z7901 Long term (current) use of anticoagulants: Secondary | ICD-10-CM | POA: Insufficient documentation

## 2022-05-17 MED ORDER — DEXAMETHASONE 4 MG PO TABS
10.0000 mg | ORAL_TABLET | Freq: Once | ORAL | Status: AC
  Administered 2022-05-17: 10 mg via ORAL
  Filled 2022-05-17: qty 3

## 2022-05-17 MED ORDER — AMOXICILLIN-POT CLAVULANATE 875-125 MG PO TABS: 1.0000 | ORAL_TABLET | Freq: Two times a day (BID) | ORAL | 0 refills | Status: DC

## 2022-05-17 MED ORDER — OXYMETAZOLINE HCL 0.05 % NA SOLN
1.0000 | Freq: Two times a day (BID) | NASAL | Status: DC | PRN
  Administered 2022-05-17: 1 via NASAL
  Filled 2022-05-17: qty 30

## 2022-05-17 NOTE — Discharge Instructions (Signed)
Follow-up with your primary care doctor if not feeling better.  Overall suspect that your congestion is causing your ear pain.  You have been treated with a long-acting steroid.  I have actually given you Afrin nasal spray to take for home.  Please use this twice a day with 1 spray in each nostril for the next 3 days and then discontinue.  Take antibiotic as prescribed.

## 2022-05-17 NOTE — ED Notes (Signed)
Pt discharged to home. Discharge instructions have been discussed with patient and/or family members. Pt verbally acknowledges understanding d/c instructions, and endorses comprehension to checkout at registration before leaving.  °

## 2022-05-17 NOTE — ED Triage Notes (Signed)
Pt arrives pov, to triage in wheelchair, c/o LT ear pain x 2 weeks. Reports dizziness x 1 week when lying on left side.Speech clear, VAN neg

## 2022-05-17 NOTE — ED Provider Notes (Signed)
[facility] EMERGENCY DEPARTMENT Provider Note   CSN: [id number] Arrival date & time: 05/17/22  [id number]     History  Chief Complaint  Patient presents with   Otalgia    Rachael Becker is a 56 y.o. female.  Patient here with nasal congestion, left ear pain, dizziness.  Symptoms for about a week or 2.  She denies any weakness or numbness or vision loss or speech changes.  Nothing makes it worse or better.  She denies any fevers or chills.  She has been using some over-the-counter eardrops with some relief.  History of hypertension, high cholesterol, bipolar, diabetes.  The history is provided by the patient.       Home Medications Prior to Admission medications   Medication Sig Start Date End Date Taking? Authorizing Provider  amoxicillin-clavulanate (AUGMENTIN) 875-125 MG tablet Take 1 tablet by mouth every 12 (twelve) hours. 05/17/22  Yes Lurena Naeve, DO  albuterol (VENTOLIN HFA) 108 (90 Base) MCG/ACT inhaler Inhale 2 puffs into the lungs every 4 (four) hours as needed (COPD). 08/14/21   [provider]  ALPRAZolam Prudy Feeler) 1 MG tablet Take 1 tablet (1 mg total) by mouth 2 (two) times daily as needed for anxiety. Patient taking differently: Take 1 mg by mouth 3 (three) times daily as needed for anxiety. 09/19/14   Waldon Merl, PA-C  amLODipine (NORVASC) 10 MG tablet Take 10 mg by mouth daily. 09/06/21   [provider]  BREO ELLIPTA 100-25 MCG/ACT AEPB Inhale 1 puff into the lungs daily. 10/16/21   [provider]  buPROPion ER (WELLBUTRIN SR) 100 MG 12 hr tablet Take 100 mg by mouth daily. 10/08/21   [provider]  carvedilol (COREG) 25 MG tablet Take 25 mg by mouth 2 (two) times daily. 11/12/16   [provider]  clopidogrel (PLAVIX) 75 MG tablet Take 1 tablet (75 mg total) by mouth daily with breakfast. 11/09/21   Azalee Course, PA  ELIQUIS 5 MG TABS tablet Take 1 tablet (5 mg total) by mouth 2 (two) times daily. 01/23/17    Costella, Darci Current, PA-C  hydrochlorothiazide (HYDRODIURIL) 25 MG tablet Take 1 tablet (25 mg total) by mouth daily. Patient taking differently: Take 12.5-25 mg by mouth 3 (three) times daily as needed (fluid). 08/28/14   Waldon Merl, PA-C  losartan (COZAAR) 100 MG tablet Take 100 mg by mouth daily. 11/05/16   [provider]  methocarbamol (ROBAXIN) 500 MG tablet Take 1 tablet (500 mg total) by mouth 2 (two) times daily. Patient taking differently: Take 750 mg by mouth 4 (four) times daily. 06/26/18   Khatri, Hina, PA-C  oxyCODONE-acetaminophen (PERCOCET) 10-325 MG tablet Take 1 tablet by mouth in the morning, at noon, in the evening, and at bedtime. 10/04/21   [provider]  QUEtiapine (SEROQUEL) 50 MG tablet Take 50 mg by mouth at bedtime. 11/21/16   [provider]  rosuvastatin (CRESTOR) 5 MG tablet Take 1 tablet (5 mg total) by mouth daily. 11/08/21 11/08/22  Azalee Course, PA  Semaglutide (RYBELSUS) 3 MG TABS Take 3 mg by mouth daily. 12/26/19   [provider]  venlafaxine XR (EFFEXOR-XR) 75 MG 24 hr capsule Take 1 capsule (75 mg total) by mouth daily with breakfast. Patient taking differently: Take 300 mg by mouth daily with breakfast. 10/03/14   Waldon Merl, PA-C      Allergies    Ace inhibitors and Naprosyn [naproxen]    Review of Systems   Review  of Systems  Physical Exam Updated Vital Signs BP (!) 159/98   Pulse 71   Temp 98.5 F (36.9 C) (Oral)   Resp 16   Ht 5\' 5"  (1.651 m)   Wt 95.3 kg   SpO2 95%   BMI 34.95 kg/m  Physical Exam Vitals and nursing note reviewed.  Constitutional:      General: She is not in acute distress.    Appearance: She is well-developed.  HENT:     Head: Normocephalic and atraumatic.     Ears:     Comments: There is some fluid behind the left eardrum but otherwise tympanic membranes are fairly unremarkable bilaterally, there is no earwax in your canals bilaterally    Nose: Congestion present.      Mouth/Throat:     Mouth: Mucous membranes are moist.  Eyes:     Conjunctiva/sclera: Conjunctivae normal.     Pupils: Pupils are equal, round, and reactive to light.  Cardiovascular:     Rate and Rhythm: Normal rate and regular rhythm.     Pulses: Normal pulses.     Heart sounds: Normal heart sounds. No murmur heard. Pulmonary:     Effort: Pulmonary effort is normal. No respiratory distress.     Breath sounds: Normal breath sounds.  Abdominal:     Palpations: Abdomen is soft.     Tenderness: There is no abdominal tenderness.  Musculoskeletal:        General: No swelling.     Cervical back: Neck supple.  Skin:    General: Skin is warm and dry.     Capillary Refill: Capillary refill takes less than 2 seconds.  Neurological:     General: No focal deficit present.     Mental Status: She is alert and oriented to person, place, and time.     Cranial Nerves: No cranial nerve deficit.     Sensory: No sensory deficit.     Motor: No weakness.     Coordination: Coordination normal.     Comments: 5+ out of 5 strength throughout, normal sensation, normal finger-nose-finger, normal speech, normal visual fields, no nystagmus  Psychiatric:        Mood and Affect: Mood normal.     ED Results / Procedures / Treatments   Labs (all labs ordered are listed, but only abnormal results are displayed) Labs Reviewed - No data to display  EKG None  Radiology No results found.  Procedures Procedures    Medications Ordered in ED Medications  dexamethasone (DECADRON) tablet 10 mg (has no administration in time range)  oxymetazoline (AFRIN) 0.05 % nasal spray 1 spray (has no administration in time range)    ED Course/ Medical Decision Making/ A&P                           Medical Decision Making Risk OTC drugs. Prescription drug management.   Rachael Becker is here with nasal congestion ear pain and intermittent dizziness.  Patient with history of hypertension, high cholesterol,  bipolar, diabetes.  Differential diagnosis likely viral process/allergic process.  She is got nasal congestion and fluid behind the left eardrum.  We will treat with Decadron and Afrin and Augmentin.  Patient has no evidence to suggest stroke.  She has normal neurological exam.  She has no headache, no vision loss, no nystagmus.  Dizzy feeling likely from disequilibrium from her ear.  Overall she is well-appearing.  I do not think there is any  need for further work-up at this time.  She understands return precautions.  Discharged in good condition.  This chart was dictated using voice recognition software.  Despite best efforts to proofread,  errors can occur which can change the documentation meaning.         Final Clinical Impression(s) / ED Diagnoses Final diagnoses:  Nasal congestion  Left ear pain    Rx / DC Orders ED Discharge Orders          Ordered    amoxicillin-clavulanate (AUGMENTIN) 875-125 MG tablet  Every 12 hours        05/17/22 Elsie, [place], DO 05/17/22 [phone]

## 2022-06-20 ENCOUNTER — Ambulatory Visit (HOSPITAL_COMMUNITY): Admission: RE | Admit: 2022-06-20 | Payer: Medicare Other | Source: Ambulatory Visit

## 2022-08-15 NOTE — Progress Notes (Deleted)
Cardiology Clinic Note   Patient Name: Rachael Becker Date of Encounter: 08/18/2022  Primary Care Provider:  Wynelle Fanny, DO Primary Cardiologist:  [name], MD  Patient Profile    Rachael Becker 57 year old female presents to the clinic today for follow-up evaluation of her peripheral arterial disease.  Past Medical History    Past Medical History:  Diagnosis Date   Bipolar disorder ([place])    Depression    Diabetes mellitus without complication ([place])    Dysrhythmia    a fib   Heart disease    History of chicken pox    Hyperlipidemia    Hypertension    Past Surgical History:  Procedure Laterality Date   ABDOMINAL AORTOGRAM W/LOWER EXTREMITY N/A 11/07/2021   Procedure: ABDOMINAL AORTOGRAM W/LOWER EXTREMITY;  Surgeon: Lorretta Harp, MD;  Location: [facility];  Service: Cardiovascular;  Laterality: N/A;   ABDOMINAL HYSTERECTOMY  2001   BYPASS GRAFT  01.12.2016   MULTIPLE TOOTH EXTRACTIONS     [place]   PERIPHERAL VASCULAR ATHERECTOMY  11/07/2021   Procedure: PERIPHERAL VASCULAR ATHERECTOMY;  Surgeon: Lorretta Harp, MD;  Location: [facility];  Service: Cardiovascular;;  Rt SFA   TONSILLECTOMY  1970   WISDOM TOOTH EXTRACTION      Allergies  Allergies  Allergen Reactions   Ace Inhibitors Cough   Naprosyn [Naproxen] Nausea And Vomiting    History of Present Illness    Rachael Becker has a PMH of peripheral arterial disease, hyperlipidemia, hypertension, and peripheral vascular disease with claudication.  She was seen for evaluation by Dr. Gwenlyn Found on 12/10/2021.  She had been referred for her symptomatic peripheral arterial disease.  She had lower extremity Dopplers performed 08/05/2021 which showed right ABI 0.72 and left ABI of 0.68.  She reported that her claudication was worse in her right leg than her left.  She underwent peripheral angiography 11/07/2021 which showed subtotal occlusion of right SFA with high-grade lesions in her proximal  and midportion with two-vessel runoff.  Her left SFA had CTO beginning at the ostium with reconstitution in the abductor canal with two-vessel runoff.  She underwent arthrectomy followed by drug-coated balloon angioplasty.  Her ABI normalized.  Her right SFA was widely patent by duplex study performed on 11/15/2021.  Her claudication had resolved.  She has stopped smoking.  She presents to the clinic today for follow-up evaluation and states***  *** denies chest pain, shortness of breath, lower extremity edema, fatigue, palpitations, melena, hematuria, hemoptysis, diaphoresis, weakness, presyncope, syncope, orthopnea, and PND.  Peripheral arterial disease-denies lower extremity claudication.  Underwent arthrectomy followed by drug-coated balloon angioplasty 11/07/2021.  Postprocedure ABIs normalized. Continue Plavix, rosuvastatin Heart healthy low-sodium diet-salty 6 given Increase physical activity as tolerated Plan for repeat lower extremity arterial's and ABIs 5/24  Hyperlipidemia-LDL Continue Plavix, rosuvastatin Heart healthy low-sodium high-fiber diet Increase physical activity as tolerated Follows with PCP  Tobacco abuse-continues to refrain from smoking. Congratulated on smoking cessation  Disposition: Follow-up with Dr.Berry after lower extremity diagnostics  Home Medications    Prior to Admission medications   Medication Sig Start Date End Date Taking? Authorizing Provider  albuterol (VENTOLIN HFA) 108 (90 Base) MCG/ACT inhaler Inhale 2 puffs into the lungs every 4 (four) hours as needed (COPD). 08/14/21   [provider]  ALPRAZolam Duanne Moron) 1 MG tablet Take 1 tablet (1 mg total) by mouth 2 (two) times daily as needed for anxiety. Patient taking differently: Take 1 mg by mouth 3 (  three) times daily as needed for anxiety. 09/19/14   Brunetta Jeans, PA-C  amLODipine (NORVASC) 10 MG tablet Take 10 mg by mouth daily. 09/06/21   [provider]   amoxicillin-clavulanate (AUGMENTIN) 875-125 MG tablet Take 1 tablet by mouth every 12 (twelve) hours. 05/17/22   Curatolo, Adam, DO  BREO ELLIPTA 100-25 MCG/ACT AEPB Inhale 1 puff into the lungs daily. 10/16/21   [provider]  buPROPion ER (WELLBUTRIN SR) 100 MG 12 hr tablet Take 100 mg by mouth daily. 10/08/21   [provider]  carvedilol (COREG) 25 MG tablet Take 25 mg by mouth 2 (two) times daily. 11/12/16   [provider]  clopidogrel (PLAVIX) 75 MG tablet Take 1 tablet (75 mg total) by mouth daily with breakfast. 11/09/21   Almyra Deforest, PA  ELIQUIS 5 MG TABS tablet Take 1 tablet (5 mg total) by mouth 2 (two) times daily. 01/23/17   Costella, Vista Mink, PA-C  hydrochlorothiazide (HYDRODIURIL) 25 MG tablet Take 1 tablet (25 mg total) by mouth daily. Patient taking differently: Take 12.5-25 mg by mouth 3 (three) times daily as needed (fluid). 08/28/14   Brunetta Jeans, PA-C  losartan (COZAAR) 100 MG tablet Take 100 mg by mouth daily. 11/05/16   [provider]  methocarbamol (ROBAXIN) 500 MG tablet Take 1 tablet (500 mg total) by mouth 2 (two) times daily. Patient taking differently: Take 750 mg by mouth 4 (four) times daily. 06/26/18   Khatri, Hina, PA-C  oxyCODONE-acetaminophen (PERCOCET) 10-325 MG tablet Take 1 tablet by mouth in the morning, at noon, in the evening, and at bedtime. 10/04/21   [provider]  QUEtiapine (SEROQUEL) 50 MG tablet Take 50 mg by mouth at bedtime. 11/21/16   [provider]  rosuvastatin (CRESTOR) 5 MG tablet Take 1 tablet (5 mg total) by mouth daily. 11/08/21 11/08/22  Almyra Deforest, PA  Semaglutide (RYBELSUS) 3 MG TABS Take 3 mg by mouth daily. 12/26/19   [provider]  venlafaxine XR (EFFEXOR-XR) 75 MG 24 hr capsule Take 1 capsule (75 mg total) by mouth daily with breakfast. Patient taking differently: Take 300 mg by mouth daily with breakfast. 10/03/14   Brunetta Jeans, PA-C    Family History    Family  History  Problem Relation Age of Onset   Stroke Father 67       Deceased   Depression Father    Hypertension Father    Heart disease Father    Hypertension Mother        Living   Hypertension Sister    Healthy Son        x1   Healthy Daughter        x1   She indicated that the status of her mother is unknown. She indicated that the status of her father is unknown. She indicated that the status of her sister is unknown. She indicated that the status of her daughter is unknown. She indicated that the status of her son is unknown.  Social History    Social History   Socioeconomic History   Marital status: Single    Spouse name: Not on file   Number of children: Not on file   Years of education: Not on file   Highest education level: Not on file  Occupational History   Not on file  Tobacco Use   Smoking status: Every Day    Types: Cigarettes    Last attempt to quit: 07/07/2014    Years  since quitting: 8.1   Smokeless tobacco: Never  Vaping Use   Vaping Use: Never used  Substance and Sexual Activity   Alcohol use: Not Currently    Alcohol/week: 10.0 standard drinks of alcohol    Types: 10 Standard drinks or equivalent per week    Comment: occ   Drug use: No   Sexual activity: Not Currently    Birth control/protection: Surgical  Other Topics Concern   Not on file  Social History Narrative   Not on file   Social Determinants of Health   Financial Resource Strain: Not on file  Food Insecurity: Not on file  Transportation Needs: Not on file  Physical Activity: Not on file  Stress: Not on file  Social Connections: Not on file  Intimate Partner Violence: Not on file     Review of Systems    General:  No chills, fever, night sweats or weight changes.  Cardiovascular:  No chest pain, dyspnea on exertion, edema, orthopnea, palpitations, paroxysmal nocturnal dyspnea. Dermatological: No rash, lesions/masses Respiratory: No cough, dyspnea Urologic: No hematuria,  dysuria Abdominal:   No nausea, vomiting, diarrhea, bright red blood per rectum, melena, or hematemesis Neurologic:  No visual changes, wkns, changes in mental status. All other systems reviewed and are otherwise negative except as noted above.  Physical Exam    VS:  There were no vitals taken for this visit. , BMI There is no height or weight on file to calculate BMI. GEN: Well nourished, well developed, in no acute distress. HEENT: normal. Neck: Supple, no JVD, carotid bruits, or masses. Cardiac: RRR, no murmurs, rubs, or gallops. No clubbing, cyanosis, edema.  Radials/DP/PT 2+ and equal bilaterally.  Respiratory:  Respirations regular and unlabored, clear to auscultation bilaterally. GI: Soft, nontender, nondistended, BS + x 4. MS: no deformity or atrophy. Skin: warm and dry, no rash. Neuro:  Strength and sensation are intact. Psych: Normal affect.  Accessory Clinical Findings    Recent Labs: 11/08/2021: ALT 10; BUN 13; Creatinine, Ser 0.78; Hemoglobin 12.7; Platelets 187; Potassium 4.2; Sodium 140   Recent Lipid Panel    Component Value Date/Time   CHOL 205 (H) 11/08/2021 0133   TRIG 83 11/08/2021 0133   HDL 46 11/08/2021 0133   CHOLHDL 4.5 11/08/2021 0133   VLDL 17 11/08/2021 0133   LDLCALC 142 (H) 11/08/2021 0133    No BP recorded.  {Refresh Note OR Click here to enter BP  :1}***    ECG personally reviewed by me today- *** - No acute changes   ABIs 11/15/2021    Right ABIs appear increased compared to prior study on 08/05/21. Left ABIs  appear essentially unchanged compared to prior study on 08/05/21.    Summary:  Right: Resting right ankle-brachial index is within normal range. No  evidence of significant right lower extremity arterial disease. The right  toe-brachial index is abnormal.   Left: Resting left ankle-brachial index indicates moderate left lower  extremity arterial disease. The left toe-brachial index is abnormal.    *See table(s) above for  measurements and observations.      Electronically signed by Kathlyn Sacramento MD on 11/15/2021 at 12:56:50 PM.        Final     Assessment & Plan   1.  ***   Jossie Ng. Marilyn Wing NP-C     08/18/2022, 11:26 AM [place] [address] Office [phone] Fax [phone]    I spent***minutes examining this patient, reviewing medications, and using  patient centered shared decision making involving her cardiac care.  Prior to her visit I spent greater than 20 minutes reviewing her past medical history,  medications, and prior cardiac tests.

## 2022-08-19 ENCOUNTER — Ambulatory Visit: Payer: Medicare HMO | Admitting: General Practice

## 2022-08-22 ENCOUNTER — Other Ambulatory Visit: Payer: Self-pay | Admitting: Physician Assistant

## 2022-08-22 ENCOUNTER — Encounter (HOSPITAL_COMMUNITY): Payer: Self-pay

## 2022-08-22 ENCOUNTER — Encounter: Payer: Self-pay | Admitting: Physician Assistant

## 2022-08-22 ENCOUNTER — Inpatient Hospital Stay (HOSPITAL_COMMUNITY): Payer: Medicare HMO

## 2022-08-22 ENCOUNTER — Inpatient Hospital Stay (HOSPITAL_COMMUNITY)
Admission: RE | Admit: 2022-08-22 | Discharge: 2022-08-26 | DRG: 322 | Disposition: A | Discharge status: Home / Self Care | Payer: Medicare HMO | Source: Ambulatory Visit | Attending: Cardiovascular Disease | Admitting: Cardiovascular Disease

## 2022-08-22 ENCOUNTER — Ambulatory Visit: Payer: Medicare HMO | Attending: General Practice | Admitting: Physician Assistant

## 2022-08-22 VITALS — BP 128/76 | HR 68 | Ht 65.0 in | Wt 187.0 lb

## 2022-08-22 DIAGNOSIS — Z823 Family history of stroke: Secondary | ICD-10-CM | POA: Diagnosis not present

## 2022-08-22 DIAGNOSIS — Z888 Allergy status to other drugs, medicaments and biological substances status: Secondary | ICD-10-CM

## 2022-08-22 DIAGNOSIS — Z7902 Long term (current) use of antithrombotics/antiplatelets: Secondary | ICD-10-CM | POA: Diagnosis not present

## 2022-08-22 DIAGNOSIS — E785 Hyperlipidemia, unspecified: Secondary | ICD-10-CM | POA: Diagnosis present

## 2022-08-22 DIAGNOSIS — I25700 Atherosclerosis of coronary artery bypass graft(s), unspecified, with unstable angina pectoris: Secondary | ICD-10-CM | POA: Diagnosis present

## 2022-08-22 DIAGNOSIS — G8929 Other chronic pain: Secondary | ICD-10-CM | POA: Diagnosis present

## 2022-08-22 DIAGNOSIS — Z955 Presence of coronary angioplasty implant and graft: Principal | ICD-10-CM

## 2022-08-22 DIAGNOSIS — I25110 Atherosclerotic heart disease of native coronary artery with unstable angina pectoris: Secondary | ICD-10-CM | POA: Diagnosis present

## 2022-08-22 DIAGNOSIS — Z7984 Long term (current) use of oral hypoglycemic drugs: Secondary | ICD-10-CM | POA: Diagnosis not present

## 2022-08-22 DIAGNOSIS — F17210 Nicotine dependence, cigarettes, uncomplicated: Secondary | ICD-10-CM | POA: Diagnosis present

## 2022-08-22 DIAGNOSIS — Z7901 Long term (current) use of anticoagulants: Secondary | ICD-10-CM

## 2022-08-22 DIAGNOSIS — I200 Unstable angina: Principal | ICD-10-CM | POA: Diagnosis present

## 2022-08-22 DIAGNOSIS — Z720 Tobacco use: Secondary | ICD-10-CM | POA: Insufficient documentation

## 2022-08-22 DIAGNOSIS — I739 Peripheral vascular disease, unspecified: Secondary | ICD-10-CM

## 2022-08-22 DIAGNOSIS — Z8249 Family history of ischemic heart disease and other diseases of the circulatory system: Secondary | ICD-10-CM

## 2022-08-22 DIAGNOSIS — I480 Paroxysmal atrial fibrillation: Secondary | ICD-10-CM | POA: Insufficient documentation

## 2022-08-22 DIAGNOSIS — F419 Anxiety disorder, unspecified: Secondary | ICD-10-CM | POA: Diagnosis present

## 2022-08-22 DIAGNOSIS — I10 Essential (primary) hypertension: Secondary | ICD-10-CM

## 2022-08-22 DIAGNOSIS — Z79899 Other long term (current) drug therapy: Secondary | ICD-10-CM

## 2022-08-22 DIAGNOSIS — E119 Type 2 diabetes mellitus without complications: Secondary | ICD-10-CM

## 2022-08-22 DIAGNOSIS — E1151 Type 2 diabetes mellitus with diabetic peripheral angiopathy without gangrene: Secondary | ICD-10-CM | POA: Diagnosis present

## 2022-08-22 DIAGNOSIS — F319 Bipolar disorder, unspecified: Secondary | ICD-10-CM | POA: Diagnosis present

## 2022-08-22 DIAGNOSIS — I25709 Atherosclerosis of coronary artery bypass graft(s), unspecified, with unspecified angina pectoris: Secondary | ICD-10-CM | POA: Diagnosis present

## 2022-08-22 DIAGNOSIS — R079 Chest pain, unspecified: Secondary | ICD-10-CM | POA: Diagnosis not present

## 2022-08-22 LAB — COMPREHENSIVE METABOLIC PANEL
ALT: 12 U/L (ref 0–44)
AST: 16 U/L (ref 15–41)
Albumin: 3.4 g/dL — ABNORMAL LOW (ref 3.5–5.0)
Alkaline Phosphatase: 68 U/L (ref 38–126)
Anion gap: 10 (ref 5–15)
BUN: 19 mg/dL (ref 6–20)
CO2: 26 mmol/L (ref 22–32)
Calcium: 9.5 mg/dL (ref 8.9–10.3)
Chloride: 108 mmol/L (ref 98–111)
Creatinine, Ser: 0.91 mg/dL (ref 0.44–1.00)
GFR, Estimated: >60 mL/min (ref >60)
Glucose, Bld: 123 mg/dL — ABNORMAL HIGH (ref 70–99)
Potassium: 4.6 mmol/L (ref 3.5–5.1)
Sodium: 144 mmol/L (ref 135–145)
Total Bilirubin: 0.3 mg/dL (ref 0.3–1.2)
Total Protein: 6.3 g/dL — ABNORMAL LOW (ref 6.5–8.1)

## 2022-08-22 LAB — TROPONIN I (HIGH SENSITIVITY)
Troponin I (High Sensitivity): 8 ng/L (ref <18)
Troponin I (High Sensitivity): 8 ng/L (ref <18)

## 2022-08-22 LAB — CBC WITH DIFFERENTIAL/PLATELET
Abs Immature Granulocytes: 0.03 10*3/uL (ref 0.00–0.07)
Basophils Absolute: 0 10*3/uL (ref 0.0–0.1)
Basophils Relative: 0 %
Eosinophils Absolute: 0.2 10*3/uL (ref 0.0–0.5)
Eosinophils Relative: 2 %
HCT: 43.1 % (ref 36.0–46.0)
Hemoglobin: 13.2 g/dL (ref 12.0–15.0)
Immature Granulocytes: 0 %
Lymphocytes Relative: 36 %
Lymphs Abs: 3.1 10*3/uL (ref 0.7–4.0)
MCH: 25.4 pg — ABNORMAL LOW (ref 26.0–34.0)
MCHC: 30.6 g/dL (ref 30.0–36.0)
MCV: 83 fL (ref 80.0–100.0)
Monocytes Absolute: 0.9 10*3/uL (ref 0.1–1.0)
Monocytes Relative: 11 %
Neutro Abs: 4.3 10*3/uL (ref 1.7–7.7)
Neutrophils Relative %: 51 %
Platelets: 226 10*3/uL (ref 150–400)
RBC: 5.19 MIL/uL — ABNORMAL HIGH (ref 3.87–5.11)
RDW: 17.9 % — ABNORMAL HIGH (ref 11.5–15.5)
WBC: 8.5 10*3/uL (ref 4.0–10.5)
nRBC: 0 % (ref 0.0–0.2)

## 2022-08-22 LAB — GLUCOSE, CAPILLARY: Glucose-Capillary: 109 mg/dL — ABNORMAL HIGH (ref 70–99)

## 2022-08-22 LAB — BRAIN NATRIURETIC PEPTIDE: B Natriuretic Peptide: 213.4 pg/mL — ABNORMAL HIGH (ref 0.0–100.0)

## 2022-08-22 MED ORDER — OXYCODONE HCL 5 MG PO TABS
5.0000 mg | ORAL_TABLET | Freq: Three times a day (TID) | ORAL | Status: DC | PRN
  Administered 2022-08-23 – 2022-08-25 (×4): 5 mg via ORAL
  Filled 2022-08-22 (×4): qty 1

## 2022-08-22 MED ORDER — CARVEDILOL 25 MG PO TABS
25.0000 mg | ORAL_TABLET | Freq: Two times a day (BID) | ORAL | Status: DC
  Administered 2022-08-22 – 2022-08-25 (×6): 25 mg via ORAL
  Filled 2022-08-22 (×7): qty 1

## 2022-08-22 MED ORDER — HEPARIN SODIUM (PORCINE) 5000 UNIT/ML IJ SOLN
5000.0000 [IU] | Freq: Three times a day (TID) | INTRAMUSCULAR | Status: DC
  Administered 2022-08-22 – 2022-08-24 (×7): 5000 [IU] via SUBCUTANEOUS
  Filled 2022-08-22 (×7): qty 1

## 2022-08-22 MED ORDER — ALPRAZOLAM 0.5 MG PO TABS
1.0000 mg | ORAL_TABLET | Freq: Two times a day (BID) | ORAL | Status: DC | PRN
  Administered 2022-08-24: 1 mg via ORAL
  Filled 2022-08-22 (×2): qty 2

## 2022-08-22 MED ORDER — OXYCODONE-ACETAMINOPHEN 5-325 MG PO TABS
1.0000 | ORAL_TABLET | Freq: Three times a day (TID) | ORAL | Status: DC | PRN
  Administered 2022-08-25 – 2022-08-26 (×2): 1 via ORAL
  Filled 2022-08-22 (×2): qty 1

## 2022-08-22 MED ORDER — INSULIN ASPART 100 UNIT/ML IJ SOLN
0.0000 [IU] | Freq: Three times a day (TID) | INTRAMUSCULAR | Status: DC
  Administered 2022-08-23 – 2022-08-24 (×3): 2 [IU] via SUBCUTANEOUS

## 2022-08-22 MED ORDER — ONDANSETRON HCL 4 MG/2ML IJ SOLN: 4.0000 mg | Freq: Four times a day (QID) | INTRAMUSCULAR | Status: DC | PRN

## 2022-08-22 MED ORDER — LABETALOL HCL 5 MG/ML IV SOLN: 10.0000 mg | INTRAVENOUS | Status: DC | PRN

## 2022-08-22 MED ORDER — CLOPIDOGREL BISULFATE 75 MG PO TABS
75.0000 mg | ORAL_TABLET | Freq: Every day | ORAL | Status: DC
  Administered 2022-08-23 – 2022-08-25 (×3): 75 mg via ORAL
  Filled 2022-08-22 (×3): qty 1

## 2022-08-22 MED ORDER — ALBUTEROL SULFATE (2.5 MG/3ML) 0.083% IN NEBU: 2.5000 mg | INHALATION_SOLUTION | RESPIRATORY_TRACT | Status: DC | PRN

## 2022-08-22 MED ORDER — BUPROPION HCL ER (SR) 100 MG PO TB12
200.0000 mg | ORAL_TABLET | Freq: Every day | ORAL | Status: DC
  Administered 2022-08-23 – 2022-08-26 (×3): 200 mg via ORAL
  Filled 2022-08-22 (×5): qty 2

## 2022-08-22 MED ORDER — ASPIRIN 81 MG PO CHEW
324.0000 mg | CHEWABLE_TABLET | Freq: Once | ORAL | Status: AC
  Administered 2022-08-23: 324 mg via ORAL
  Filled 2022-08-22: qty 4

## 2022-08-22 MED ORDER — LOSARTAN POTASSIUM 50 MG PO TABS
100.0000 mg | ORAL_TABLET | Freq: Every day | ORAL | Status: DC
  Administered 2022-08-23 – 2022-08-26 (×4): 100 mg via ORAL
  Filled 2022-08-22 (×4): qty 2

## 2022-08-22 MED ORDER — HYDROCHLOROTHIAZIDE 25 MG PO TABS
25.0000 mg | ORAL_TABLET | Freq: Every day | ORAL | Status: DC
  Administered 2022-08-23 – 2022-08-26 (×4): 25 mg via ORAL
  Filled 2022-08-22 (×4): qty 1

## 2022-08-22 MED ORDER — ACETAMINOPHEN 325 MG PO TABS: 650.0000 mg | ORAL_TABLET | ORAL | Status: DC | PRN

## 2022-08-22 MED ORDER — METHOCARBAMOL 500 MG PO TABS
500.0000 mg | ORAL_TABLET | Freq: Two times a day (BID) | ORAL | Status: DC
  Administered 2022-08-23 – 2022-08-26 (×8): 500 mg via ORAL
  Filled 2022-08-22 (×8): qty 1

## 2022-08-22 MED ORDER — INSULIN ASPART 100 UNIT/ML IJ SOLN
0.0000 [IU] | Freq: Every day | INTRAMUSCULAR | Status: DC
  Administered 2022-08-23: 2 [IU] via SUBCUTANEOUS

## 2022-08-22 MED ORDER — FLUTICASONE FUROATE-VILANTEROL 100-25 MCG/ACT IN AEPB
1.0000 | INHALATION_SPRAY | Freq: Every day | RESPIRATORY_TRACT | Status: DC
  Administered 2022-08-23 – 2022-08-25 (×3): 1 via RESPIRATORY_TRACT
  Filled 2022-08-22 (×2): qty 28

## 2022-08-22 MED ORDER — AMLODIPINE BESYLATE 5 MG PO TABS
5.0000 mg | ORAL_TABLET | Freq: Every day | ORAL | Status: DC
  Administered 2022-08-23 – 2022-08-26 (×4): 5 mg via ORAL
  Filled 2022-08-22 (×4): qty 1

## 2022-08-22 MED ORDER — ASPIRIN 81 MG PO CHEW: 324.0000 mg | CHEWABLE_TABLET | Freq: Once | ORAL | Status: DC

## 2022-08-22 MED ORDER — OXYCODONE-ACETAMINOPHEN 10-325 MG PO TABS: 1.0000 | ORAL_TABLET | Freq: Three times a day (TID) | ORAL | Status: DC | PRN

## 2022-08-22 MED ORDER — NITROGLYCERIN 0.4 MG SL SUBL: 0.4000 mg | SUBLINGUAL_TABLET | SUBLINGUAL | Status: DC | PRN

## 2022-08-22 NOTE — Progress Notes (Signed)
Cardiology Office Note:    Date:  08/22/2022   ID:  BREELY CHANT, DOB Oct 31, 1965, MRN [id number]  PCP:  Wynelle Fanny, [place] [facility] Cardiologist:  [name], MD     Referring MD: Wynelle Fanny, DO   Chief Complaint  Patient presents with   Follow-up    Seen for Dr. Gwenlyn Found   Chest Pain    Referred by [name] of [facility] for cath    History of Present Illness:    HAILLEY SOMERSET is a 57 y.o. female with a hx of CAD s/p CABG x 4 at [facility] in 2016, PAF on Eliquis, PVD, back surgery, bipolar disorder, depression, tobacco abuse, hypertension, hyperlipidemia and DM2.  Her father had CAD as well.  Patient was initially referred to Dr. Alvester Chou due to lower extremity claudication symptoms, right greater than left.  Lower extremity arterial Doppler obtained in January 2023 showed a right ABI 0.72, left ABI 0.68.  Moderate to right SFA disease occluded right anterior tibial as well as left SFA and anterior tibial.  Lower extremity angiography performed in May 2023 revealed a high-grade proximal and mid right SFA stenosis and two-vessel runoff in the left SFA CTO beginning at the origin extending into the abductor canal where it reconstituted by profunda femoris collaterals, he underwent Hawk 1 directional arthrectomy followed by drug-coated balloon angioplasty with excellent result.  She was last seen by Dr. Alvester Chou in June 2023 at which time she was doing well.  Her right SFA was widely patent on follow-up duplex performed in May.  Claudication was also resolved.  She was supposed to have repeat lower extremity Doppler in December, however missed her appointment. She is being seen by [facility] pulmonology service for central lobar emphysema.  Patient was referred to cardiology service for evaluation of unstable angina.  Her cardiologist at the [facility] actually recommended cardiac catheterization around July of last year, however this  was never done.  Since July of last year, she has been having increasing exertional chest pain and worsening dyspnea.  Her chest pain is now occurring multiple times in a day and she is taking 3-4 nitroglycerin every day.  EKG is unchanged and still shows chronic T wave inversion in the lateral leads.  I discussed the case with both the patient, daughter and also Dr. Gwenlyn Found who have seen the patient in the clinic.  The patient will need cardiac catheterization.  Given the unstable angina, she will be admitted to the [facility] today.  Eliquis will be held and the patient will undergo cardiac catheterization on Monday by Dr. Gwenlyn Found.   Past Medical History:  Diagnosis Date   Bipolar disorder ([place])    Depression    Diabetes mellitus without complication ([place])    Dysrhythmia    a fib   Heart disease    History of chicken pox    Hyperlipidemia    Hypertension     Past Surgical History:  Procedure Laterality Date   ABDOMINAL AORTOGRAM W/LOWER EXTREMITY N/A 11/07/2021   Procedure: ABDOMINAL AORTOGRAM W/LOWER EXTREMITY;  Surgeon: Lorretta Harp, MD;  Location: [place] CV [facility];  Service: Cardiovascular;  Laterality: N/A;   ABDOMINAL HYSTERECTOMY  2001   BYPASS GRAFT  01.12.2016   MULTIPLE TOOTH EXTRACTIONS     [place]   PERIPHERAL VASCULAR ATHERECTOMY  11/07/2021   Procedure: PERIPHERAL VASCULAR ATHERECTOMY;  Surgeon: Lorretta Harp, MD;  Location: [place]  CV LAB;  Service: Cardiovascular;;  Rt SFA   TONSILLECTOMY  1970   WISDOM TOOTH EXTRACTION      Current Medications: No outpatient medications have been marked as taking for the 08/22/22 encounter (Office Visit) with Almyra Deforest, [place].     Allergies:   Ace inhibitors and Naprosyn [naproxen]   Social History   Socioeconomic History   Marital status: Single    Spouse name: Not on file   Number of children: Not on file   Years of education: Not on file   Highest education level: Not on file  Occupational History   Not  on file  Tobacco Use   Smoking status: Every Day    Packs/day: 1.00    Types: Cigarettes    Last attempt to quit: 07/07/2014    Years since quitting: 8.1   Smokeless tobacco: Never  Vaping Use   Vaping Use: Never used  Substance and Sexual Activity   Alcohol use: Not Currently    Alcohol/week: 10.0 standard drinks of alcohol    Types: 10 Standard drinks or equivalent per week    Comment: occ   Drug use: No   Sexual activity: Not Currently    Birth control/protection: Surgical  Other Topics Concern   Not on file  Social History Narrative   Not on file   Social Determinants of Health   Financial Resource Strain: Not on file  Food Insecurity: Not on file  Transportation Needs: Not on file  Physical Activity: Not on file  Stress: Not on file  Social Connections: Not on file     Family History: The patient's family history includes Depression in her father; Healthy in her daughter and son; Heart disease in her father; Hypertension in her father, mother, and sister; Stroke (age of onset: 59) in her father.  ROS:   Please see the history of present illness.     All other systems reviewed and are negative.  EKGs/Labs/Other Studies Reviewed:    The following studies were reviewed today:  N/A  EKG:  EKG is ordered today.  The ekg ordered today demonstrates normal sinus rhythm, T wave inversion in the lateral leads  Recent Labs: 11/08/2021: ALT 10; BUN 13; Creatinine, Ser 0.78; Hemoglobin 12.7; Platelets 187; Potassium 4.2; Sodium 140  Recent Lipid Panel    Component Value Date/Time   CHOL 205 (H) 11/08/2021 0133   TRIG 83 11/08/2021 0133   HDL 46 11/08/2021 0133   CHOLHDL 4.5 11/08/2021 0133   VLDL 17 11/08/2021 0133   LDLCALC 142 (H) 11/08/2021 0133     Risk Assessment/Calculations:    CHA2DS2-VASc Score = 4   This indicates a 4.8% annual risk of stroke. The patient's score is based upon: CHF History: 0 HTN History: 1 Diabetes History: 1 Stroke History:  0 Vascular Disease History: 1 Age Score: 0 Gender Score: 1          Physical Exam:    VS:  BP 128/76   Pulse 68   Ht 5' 5"$  (1.651 m)   Wt 187 lb (84.8 kg)   SpO2 97%   BMI 31.12 kg/m         Wt Readings from Last 3 Encounters:  08/22/22 187 lb (84.8 kg)  05/17/22 210 lb (95.3 kg)  12/10/21 201 lb 12.8 oz (91.5 kg)     GEN:  Well nourished, well developed in no acute distress HEENT: Normal NECK: No JVD; No carotid bruits LYMPHATICS: No lymphadenopathy CARDIAC: RRR, no murmurs, rubs,  gallops RESPIRATORY:  Clear to auscultation without rales, wheezing or rhonchi  ABDOMEN: Soft, non-tender, non-distended MUSCULOSKELETAL:  No edema; No deformity  SKIN: Warm and dry NEUROLOGIC:  Alert and oriented x 3 PSYCHIATRIC:  Normal affect   ASSESSMENT:    1. Unstable angina ([place])   2. Coronary artery disease involving coronary bypass graft of native heart with unstable angina pectoris ([place])   3. PAF (paroxysmal atrial fibrillation) ([place])   4. Essential hypertension, benign   5. Hyperlipidemia LDL goal <70   6. Controlled type 2 diabetes mellitus without complication, without long-term current use of insulin ([place])   7. PAD (peripheral artery disease) ([place])    PLAN:    In order of problems listed above:  Unstable angina: Cardiologist at the [facility] initially recommended cardiac catheterization in July 2023, however patient never went through with the cardiac catheterization.  She presented today and described worsening exertional chest pain and dyspnea on exertion for the past 6 months.  She is taking nitroglycerin multiple times throughout the day every day.  The case was discussed with Dr. Gwenlyn Found, we recommend proceed with direct admission, hold Eliquis and plan for cardiac catheterization on Monday by Dr. Gwenlyn Found.  CAD s/p CABG: Previously underwent bypass surgery in November 2016 at [facility].  Continue Plavix  PAF: Hold Eliquis in  anticipation for cardiac catheterization.  The last dose of Eliquis was on the night of 2/15  Hypertension: Blood pressure stable  Hyperlipidemia: On Crestor  DM2: Managed by primary care provider  PAD: Previously underwent right lower extremity intervention in May 2023, still has left-sided lower extremity claudication symptom.  Overdue for lower extremity ABI and LEA, this can be done in 1 month after her cardiac intervention.  Note, previous angiography revealed left lower extremity has CTO that is not amenable to percutaneous intervention.  Will need to discuss with Dr. Gwenlyn Found after ABI/LEA      Shared Decision Making/Informed Consent The risks [stroke (1 in 1000), death (1 in 68), kidney failure [usually temporary] (1 in 500), bleeding (1 in 200), allergic reaction [possibly serious] (1 in 200)], benefits (diagnostic support and management of coronary artery disease) and alternatives of a cardiac catheterization were discussed in detail with Ms. Crouch and she is willing to proceed.    Medication Adjustments/Labs and Tests Ordered: Current medicines are reviewed at length with the patient today.  Concerns regarding medicines are outlined above.  Orders Placed This Encounter  Procedures   EKG 12-Lead   No orders of the defined types were placed in this encounter.   Patient Instructions  Medication Instructions:  Do not take Eliquis due to having Heart Cath on Monday 08/25/22 *If you need a refill on your cardiac medications before your next appointment, please call your pharmacy*  Testing/Procedures: Your physician has requested that you have a cardiac catheterization. Cardiac catheterization is used to diagnose and/or treat various heart conditions. Doctors may recommend this procedure for a number of different reasons. The most common reason is to evaluate chest pain. Chest pain can be a symptom of coronary artery disease (CAD), and cardiac catheterization can show whether plaque  is narrowing or blocking your heart's arteries. This procedure is also used to evaluate the valves, as well as measure the blood flow and oxygen levels in different parts of your heart. For further information please visit [email or web address]. Please follow instruction sheet, as given.    [place] A DEPT OF [place]  [place] [id number] [place] [address] Dept: [phone] Loc: [place]  08/22/2022  You are scheduled for a Cardiac Catheterization on Monday, February 19 with Dr. [name].  1. Please arrive at the [facility] (Main Entrance A) at [facility]: [address] [place], [place] [address] at 7:00 AM (This time is two hours before your procedure to ensure your preparation). Free valet parking service is available.   Special note: Every effort is made to have your procedure done on time. Please understand that emergencies sometimes delay scheduled procedures.  2. Diet: Do not eat solid foods after midnight.  The patient may have clear liquids until 5am upon the day of the procedure.  3. Labs: You will have lab work in hospital- Direct Admit  4. Medication instructions in preparation for your procedure: *For reference purposes while preparing patient instructions.   Delete this med list prior to printing instructions for patient.*  Stop Taking Eliquis today  5. Plan for one night stay--bring personal belongings. 6. Bring a current list of your medications and current insurance cards. 7. You MUST have a responsible person to drive you home. 8. Someone MUST be with you the first 24 hours after you arrive home or your discharge will be delayed. 9. Please wear clothes that are easy to get on and off and wear slip-on shoes.  Thank you for allowing [place] to care for you!   -- [place] [facility]   ABI AND LEA- One Month  after Heart Catheterization: Your physician has requested that you have an ankle brachial index (ABI). During this test an ultrasound and blood pressure cuff are used to evaluate the arteries that supply the arms and legs with blood. Allow thirty minutes for this exam. There are no restrictions or special instructions.   Your physician has requested that you have a lower or upper extremity arterial duplex. This test is an ultrasound of the arteries in the legs or arms. It looks at arterial blood flow in the legs and arms. Allow one hour for Lower and Upper Arterial scans. There are no restrictions or special instructions    Follow-Up: At [facility], you and your health needs are our priority.  As part of our continuing mission to provide you with exceptional heart care, we have created designated Provider Care Teams.  These Care Teams include your primary Cardiologist (physician) and Advanced Practice Providers (APPs -  Physician Assistants and Nurse Practitioners) who all work together to provide you with the care you need, when you need it.  We recommend signing up for the patient portal called "MyChart".  Sign up information is provided on this After Visit Summary.  MyChart is used to connect with patients for Virtual Visits (Telemedicine).  Patients are able to view lab/test results, encounter notes, upcoming appointments, etc.  Non-urgent messages can be sent to your provider as well.   To learn more about what you can do with MyChart, go to [email or web address].      Your next appointment:    2-3 weeks with APP  Provider:   Almyra Deforest PA  Other Instructions Direct Admit to [facility]. They will call you when a bed is available    Signed, Almyra Deforest, [place]  08/22/2022 1:35 PM    [place]

## 2022-08-22 NOTE — Patient Instructions (Signed)
Medication Instructions:  Do not take Eliquis due to having Heart Cath on Monday 08/25/22 *If you need a refill on your cardiac medications before your next appointment, please call your pharmacy*  Testing/Procedures: Your physician has requested that you have a cardiac catheterization. Cardiac catheterization is used to diagnose and/or treat various heart conditions. Doctors may recommend this procedure for a number of different reasons. The most common reason is to evaluate chest pain. Chest pain can be a symptom of coronary artery disease (CAD), and cardiac catheterization can show whether plaque is narrowing or blocking your heart's arteries. This procedure is also used to evaluate the valves, as well as measure the blood flow and oxygen levels in different parts of your heart. For further information please visit [email or web address]. Please follow instruction sheet, as given.    [place] A DEPT OF [place] [id number] [place] [address] Dept: [phone] Loc: [place]  08/22/2022  You are scheduled for a Cardiac Catheterization on Monday, February 19 with Dr. [name].  1. Please arrive at the [facility] (Main Entrance A) at [facility]: [address] [place], [place] [address] at 7:00 AM (This time is two hours before your procedure to ensure your preparation). Free valet parking service is available.   Special note: Every effort is made to have your procedure done on time. Please understand that emergencies sometimes delay scheduled procedures.  2. Diet: Do not eat solid foods after midnight.  The patient may have clear liquids until 5am upon the day of the procedure.  3. Labs: You will have lab work in hospital- Direct Admit  4. Medication instructions in preparation for your procedure: *For reference purposes while preparing patient  instructions.   Delete this med list prior to printing instructions for patient.*  Stop Taking Eliquis today  5. Plan for one night stay--bring personal belongings. 6. Bring a current list of your medications and current insurance cards. 7. You MUST have a responsible person to drive you home. 8. Someone MUST be with you the first 24 hours after you arrive home or your discharge will be delayed. 9. Please wear clothes that are easy to get on and off and wear slip-on shoes.  Thank you for allowing [place] to care for you!   -- [place] [facility]   ABI AND LEA- One Month after Heart Catheterization: Your physician has requested that you have an ankle brachial index (ABI). During this test an ultrasound and blood pressure cuff are used to evaluate the arteries that supply the arms and legs with blood. Allow thirty minutes for this exam. There are no restrictions or special instructions.   Your physician has requested that you have a lower or upper extremity arterial duplex. This test is an ultrasound of the arteries in the legs or arms. It looks at arterial blood flow in the legs and arms. Allow one hour for Lower and Upper Arterial scans. There are no restrictions or special instructions    Follow-Up: At [facility], you and your health needs are our priority.  As part of our continuing mission to provide you with exceptional heart care, we have created designated Provider Care Teams.  These Care Teams include your primary Cardiologist (physician) and Advanced Practice Providers (APPs -  Physician Assistants and Nurse Practitioners) who all work together to provide you with the care you need, when  you need it.  We recommend signing up for the patient portal called "MyChart".  Sign up information is provided on this After Visit Summary.  MyChart is used to connect with patients for Virtual Visits (Telemedicine).  Patients are able to view lab/test results, encounter  notes, upcoming appointments, etc.  Non-urgent messages can be sent to your provider as well.   To learn more about what you can do with MyChart, go to [email or web address].      Your next appointment:    2-3 weeks with APP  Provider:   Almyra Deforest PA  Other Instructions Direct Admit to [facility]. They will call you when a bed is available

## 2022-08-22 NOTE — H&P (Signed)
Cardiology Admission History and Physical   Patient ID: Rachael Becker MRN: [id number]; DOB: Jun 02, 1966   Admission date: 08/22/2022  PCP:  Wynelle Fanny, [facility] Cardiologist:  [name], MD        Chief Complaint: Chest pain  Patient Profile:   Rachael Becker is a 57 y.o. female with medical history notable for CAD status post CABG, NIDDM, hypertension who is being seen 08/22/2022 for the evaluation of chest pain.  History of Present Illness:   Rachael Becker is a 57 year old female with medical history notable for CAD status post CABG, NIDDM, hypertension, PAD status post atherectomy and stenting, paroxysmal atrial fibrillation on Eliquis, bipolar disorder who presents with progressive chest pain.  Patient reports that she has been having increasing frequency of central chest pressure that occurs with minimal movement and at rest.  The chest pain is typically moderate in intensity, nonradiating and not associated with diaphoresis, nausea, jaw neck or arm pain.  She does report that it seems to improve with nitroglycerin tablets and is now taking 2-3 daily.  She also notices a sensation of orthopnea that is made it difficult for her to sleep.  She also reports chronic exertional dyspnea.  Her exercise tolerance is significantly limited due to chronic back pain and she is not able to do activities of exertion.  She is able to perform all of her ADLs without any changes over the last few weeks to months. Of note, she had previously seen her cardiologist who recommended coronary angiogram in July 2023 which was not obtained.  She was seen on day of admission for clinic evaluation regarding her progressive chest pain and ultimately it was decided to admit her for cardiac catheterization.   Past Medical History:  Diagnosis Date   Bipolar disorder ([place])    Depression    Diabetes mellitus without complication ([place])    Dysrhythmia    a fib   Heart disease     History of chicken pox    Hyperlipidemia    Hypertension     Past Surgical History:  Procedure Laterality Date   ABDOMINAL AORTOGRAM W/LOWER EXTREMITY N/A 11/07/2021   Procedure: ABDOMINAL AORTOGRAM W/LOWER EXTREMITY;  Surgeon: Lorretta Harp, MD;  Location: [facility];  Service: Cardiovascular;  Laterality: N/A;   ABDOMINAL HYSTERECTOMY  2001   BYPASS GRAFT  01.12.2016   MULTIPLE TOOTH EXTRACTIONS     [place]   PERIPHERAL VASCULAR ATHERECTOMY  11/07/2021   Procedure: PERIPHERAL VASCULAR ATHERECTOMY;  Surgeon: Lorretta Harp, MD;  Location: [facility];  Service: Cardiovascular;;  Rt SFA   TONSILLECTOMY  1970   WISDOM TOOTH EXTRACTION       Medications Prior to Admission: Prior to Admission medications   Medication Sig Start Date End Date Taking? Authorizing Provider  albuterol (VENTOLIN HFA) 108 (90 Base) MCG/ACT inhaler Inhale 2 puffs into the lungs every 4 (four) hours as needed (COPD). 08/14/21   [provider]  ALPRAZolam Duanne Moron) 1 MG tablet Take 1 tablet (1 mg total) by mouth 2 (two) times daily as needed for anxiety. Patient taking differently: Take 1 mg by mouth 3 (three) times daily as needed for anxiety. 09/19/14   Brunetta Jeans, PA-C  amLODipine (NORVASC) 10 MG tablet Take 10 mg by mouth daily. 09/06/21   [provider]  BREO ELLIPTA 100-25 MCG/ACT AEPB Inhale 1 puff into the lungs daily. 10/16/21   [provider]  buPROPion ER (  WELLBUTRIN SR) 100 MG 12 hr tablet Take 100 mg by mouth daily. 10/08/21   [provider]  carvedilol (COREG) 25 MG tablet Take 25 mg by mouth 2 (two) times daily. 11/12/16   [provider]  clopidogrel (PLAVIX) 75 MG tablet Take 1 tablet (75 mg total) by mouth daily with breakfast. 11/09/21   Almyra Deforest, PA  ELIQUIS 5 MG TABS tablet Take 1 tablet (5 mg total) by mouth 2 (two) times daily. 01/23/17   Costella, Vista Mink, PA-C  hydrochlorothiazide (HYDRODIURIL) 25 MG tablet Take 1 tablet (25  mg total) by mouth daily. Patient taking differently: Take 12.5-25 mg by mouth 3 (three) times daily as needed (fluid). 08/28/14   Brunetta Jeans, PA-C  losartan (COZAAR) 100 MG tablet Take 100 mg by mouth daily. 11/05/16   [provider]  methocarbamol (ROBAXIN) 500 MG tablet Take 1 tablet (500 mg total) by mouth 2 (two) times daily. Patient taking differently: Take 750 mg by mouth 4 (four) times daily. 06/26/18   Khatri, Hina, PA-C  oxyCODONE-acetaminophen (PERCOCET) 10-325 MG tablet Take 1 tablet by mouth in the morning, at noon, in the evening, and at bedtime. 10/04/21   [provider]  QUEtiapine (SEROQUEL) 50 MG tablet Take 50 mg by mouth at bedtime. 11/21/16   [provider]  rosuvastatin (CRESTOR) 5 MG tablet Take 1 tablet (5 mg total) by mouth daily. 11/08/21 11/08/22  Almyra Deforest, PA  Semaglutide (RYBELSUS) 3 MG TABS Take 3 mg by mouth daily. 12/26/19   [provider]  venlafaxine XR (EFFEXOR-XR) 75 MG 24 hr capsule Take 1 capsule (75 mg total) by mouth daily with breakfast. Patient taking differently: Take 300 mg by mouth daily with breakfast. 10/03/14   Brunetta Jeans, PA-C     Allergies:    Allergies  Allergen Reactions   Ace Inhibitors Cough   Naprosyn [Naproxen] Nausea And Vomiting    Social History:   Social History   Socioeconomic History   Marital status: Single    Spouse name: Not on file   Number of children: Not on file   Years of education: Not on file   Highest education level: Not on file  Occupational History   Not on file  Tobacco Use   Smoking status: Every Day    Packs/day: 1.00    Types: Cigarettes    Last attempt to quit: 07/07/2014    Years since quitting: 8.1   Smokeless tobacco: Never  Vaping Use   Vaping Use: Never used  Substance and Sexual Activity   Alcohol use: Not Currently    Alcohol/week: 10.0 standard drinks of alcohol    Types: 10 Standard drinks or equivalent per week    Comment: occ   Drug use: No    Sexual activity: Not Currently    Birth control/protection: Surgical  Other Topics Concern   Not on file  Social History Narrative   Not on file   Social Determinants of Health   Financial Resource Strain: Not on file  Food Insecurity: Not on file  Transportation Needs: Not on file  Physical Activity: Not on file  Stress: Not on file  Social Connections: Not on file  Intimate Partner Violence: Not on file    Family History:   The patient's family history includes Depression in her father; Healthy in her daughter and son; Heart disease in her father; Hypertension in her father, mother, and sister; Stroke (age of onset: 27) in her father.  ROS:  Please see the history of present illness.  All other ROS reviewed and negative.     Physical Exam/Data:   Vitals:   08/22/22 2046  BP: (!) 187/85  Pulse: 74  Resp: 18  Temp: 98.3 F (36.8 C)  TempSrc: Oral  SpO2: 97%  Weight: 85.1 kg  Height: 5' 5"$  (1.651 m)   No intake or output data in the 24 hours ending 08/22/22 2203    08/22/2022    8:46 PM 08/22/2022    9:46 AM 05/17/2022    9:40 AM  Last 3 Weights  Weight (lbs) 187 lb 11.2 oz 187 lb 210 lb  Weight (kg) 85.14 kg 84.823 kg 95.255 kg     Body mass index is 31.23 kg/m.  General:  Well nourished, well developed, in no acute distress HEENT: normal Neck: elevated JVD Vascular: Distal pulses 1+ bilaterally   Cardiac:  normal S1, S2; RRR; no murmur  Lungs:  coarse breath sounds, no wheezing, rhonchi or rales  Abd: soft, nontender, no hepatomegaly  Ext: no edema Musculoskeletal:  No deformities, normal muscle tone/bulk Skin: warm and well perfused Neuro: Alert and oriented, no focal abnormalities noted Psych:  Normal affect    EKG:  The ECG that was done 08/22/2022 was personally reviewed and demonstrates NSR**  Relevant CV Studies: N/A  Laboratory Data:  High Sensitivity Troponin:  No results for input(s): "TROPONINIHS" in the last 720 hours.     ChemistryNo results for input(s): "NA", "K", "CL", "CO2", "GLUCOSE", "BUN", "CREATININE", "CALCIUM", "MG", "GFRNONAA", "GFRAA", "ANIONGAP" in the last 168 hours.  No results for input(s): "PROT", "ALBUMIN", "AST", "ALT", "ALKPHOS", "BILITOT" in the last 168 hours. Lipids No results for input(s): "CHOL", "TRIG", "HDL", "LABVLDL", "LDLCALC", "CHOLHDL" in the last 168 hours. Hematology Recent Labs  Lab 08/22/22 2125  WBC 8.5  RBC 5.19*  HGB 13.2  HCT 43.1  MCV 83.0  MCH 25.4*  MCHC 30.6  RDW 17.9*  PLT 226   Thyroid No results for input(s): "TSH", "FREET4" in the last 168 hours. BNPNo results for input(s): "BNP", "PROBNP" in the last 168 hours.  DDimer No results for input(s): "DDIMER" in the last 168 hours.   Radiology/Studies:  No results found.   Assessment and Plan:  Rachael Becker is a 57 yo w/PMHx of CAD s/p CABG, DM, PAD s/p angioplasty who is admitted for progressive chest pain c/f unstable angina.  Unstable Angina Patient with progressive chest pain/pressure. If ECG changes or abnormal trop, starting heparin gtt C/w plavix 16m daily, aspirin loading Elevated JVP on exam, if Cr amenable, will give gentle diuresis to improve coronary perfusion TTE pending  CAD s/p CABG LIMA-LAD, SVG-D2, SVG-D1/ramus sequentially, SVG - OM in 2016. C/w home plavix No longer on statin, taking Repatha LDL, Lp(a) pending  HTN Patient reports home Bps are typically well controlled, hypertensive on admission to [id number] Continue with carvedilol, amlodipine, HCTZ, losartan 1020m Patient has concerns regarding pill burden - may be reasonable to consider combo pills on discharge  NIDDM Home regimen: low dose semaglutide; holding for now A1C pending ISS QAC/HS  PAD/HLD Angioplasty/stenting to SFA 2023 C/w plavix, PCSK9i  Anxiety Alprazolam 65m49mID PRN. Need to clarify prescription history of Venlafaxine, Quetiapine  VTE ppx: heparin 5000u q8; dc when restarting DOAC Heart  healthy diet Dispo: pending LHC on 2/19  Risk Assessment/Risk Scores:    TIMI Risk Score for Unstable Angina or Non-ST Elevation MI:   The patient's TIMI risk score is 4, which indicates  a 20% risk of all cause mortality, new or recurrent myocardial infarction or need for urgent revascularization in the next 14 days.  New York Heart Association (NYHA) Functional Class NYHA Class II-III  CHA2DS2-VASc Score = 4   This indicates a 4.8% annual risk of stroke. The patient's score is based upon: CHF History: 0 HTN History: 1 Diabetes History: 1 Stroke History: 0 Vascular Disease History: 1 Age Score: 0 Gender Score: 1    Severity of Illness: The appropriate patient status for this patient is INPATIENT. Inpatient status is judged to be reasonable and necessary in order to provide the required intensity of service to ensure the patient's safety. The patient's presenting symptoms, physical exam findings, and initial radiographic and laboratory data in the context of their chronic comorbidities is felt to place them at high risk for further clinical deterioration. Furthermore, it is not anticipated that the patient will be medically stable for discharge from the hospital within 2 midnights of admission.   * I certify that at the point of admission it is my clinical judgment that the patient will require inpatient hospital care spanning beyond 2 midnights from the point of admission due to high intensity of service, high risk for further deterioration and high frequency of surveillance required.*   For questions or updates, please contact [place] Please consult [email or web address] for contact info under     Signed, Cecille Rubin, MD/MPH 08/22/2022 10:03 PM

## 2022-08-23 ENCOUNTER — Inpatient Hospital Stay (HOSPITAL_COMMUNITY): Payer: Medicare HMO

## 2022-08-23 ENCOUNTER — Encounter (HOSPITAL_COMMUNITY): Payer: Self-pay | Admitting: Cardiovascular Disease

## 2022-08-23 DIAGNOSIS — R079 Chest pain, unspecified: Secondary | ICD-10-CM

## 2022-08-23 DIAGNOSIS — I200 Unstable angina: Secondary | ICD-10-CM | POA: Diagnosis not present

## 2022-08-23 LAB — BASIC METABOLIC PANEL
Anion gap: 9 (ref 5–15)
BUN: 18 mg/dL (ref 6–20)
CO2: 22 mmol/L (ref 22–32)
Calcium: 8.8 mg/dL — ABNORMAL LOW (ref 8.9–10.3)
Chloride: 108 mmol/L (ref 98–111)
Creatinine, Ser: 0.79 mg/dL (ref 0.44–1.00)
GFR, Estimated: >60 mL/min (ref >60)
Glucose, Bld: 126 mg/dL — ABNORMAL HIGH (ref 70–99)
Potassium: 4 mmol/L (ref 3.5–5.1)
Sodium: 139 mmol/L (ref 135–145)

## 2022-08-23 LAB — LIPID PANEL
Cholesterol: 179 mg/dL (ref 0–200)
HDL: 47 mg/dL (ref >40)
LDL Cholesterol: 109 mg/dL — ABNORMAL HIGH (ref 0–99)
Total CHOL/HDL Ratio: 3.8 RATIO
Triglycerides: 115 mg/dL (ref <150)
VLDL: 23 mg/dL (ref 0–40)

## 2022-08-23 LAB — GLUCOSE, CAPILLARY
Glucose-Capillary: 122 mg/dL — ABNORMAL HIGH (ref 70–99)
Glucose-Capillary: 141 mg/dL — ABNORMAL HIGH (ref 70–99)
Glucose-Capillary: 158 mg/dL — ABNORMAL HIGH (ref 70–99)
Glucose-Capillary: 203 mg/dL — ABNORMAL HIGH (ref 70–99)

## 2022-08-23 LAB — TSH: TSH: 4.199 u[IU]/mL (ref 0.350–4.500)

## 2022-08-23 LAB — HEMOGLOBIN A1C
Hgb A1c MFr Bld: 6.4 % — ABNORMAL HIGH (ref 4.8–5.6)
Mean Plasma Glucose: 136.98 mg/dL

## 2022-08-23 LAB — HEPATITIS C ANTIBODY: HCV Ab: NONREACTIVE

## 2022-08-23 LAB — ECHOCARDIOGRAM COMPLETE
Area-P 1/2: 3.65 cm2
Est EF: 55
Height: 65 in
S' Lateral: 3.1 cm
Weight: 2984.15 oz

## 2022-08-23 LAB — HIV ANTIBODY (ROUTINE TESTING W REFLEX): HIV Screen 4th Generation wRfx: NONREACTIVE

## 2022-08-23 MED ORDER — FUROSEMIDE 10 MG/ML IJ SOLN
20.0000 mg | Freq: Once | INTRAMUSCULAR | Status: AC
  Administered 2022-08-23: 20 mg via INTRAVENOUS
  Filled 2022-08-23: qty 2

## 2022-08-23 MED ORDER — NICOTINE 14 MG/24HR TD PT24
14.0000 mg | MEDICATED_PATCH | Freq: Every day | TRANSDERMAL | Status: DC
  Administered 2022-08-23 – 2022-08-26 (×4): 14 mg via TRANSDERMAL
  Filled 2022-08-23 (×4): qty 1

## 2022-08-23 MED ORDER — ROSUVASTATIN CALCIUM 5 MG PO TABS
10.0000 mg | ORAL_TABLET | Freq: Every day | ORAL | Status: DC
  Administered 2022-08-23 – 2022-08-24 (×2): 10 mg via ORAL
  Filled 2022-08-23 (×2): qty 2

## 2022-08-23 NOTE — Plan of Care (Signed)
  Problem: Nutrition: Goal: Adequate nutrition will be maintained Outcome: Completed/Met   Problem: Coping: Goal: Level of anxiety will decrease Outcome: Completed/Met   Problem: Pain Managment: Goal: General experience of comfort will improve Outcome: Completed/Met   Problem: Safety: Goal: Ability to remain free from injury will improve Outcome: Completed/Met   Problem: Nutritional: Goal: Maintenance of adequate nutrition will improve Outcome: Completed/Met

## 2022-08-23 NOTE — Progress Notes (Signed)
Rounding Note    Patient Name: Rachael Becker Date of Encounter: 08/23/2022  [facility] Cardiologist: [name], MD   Subjective   Feeling well.  No chest pain at this time.   Inpatient Medications    Scheduled Meds:  amLODipine  5 mg Oral Daily   buPROPion ER  200 mg Oral Daily   carvedilol  25 mg Oral BID   clopidogrel  75 mg Oral Q breakfast   fluticasone furoate-vilanterol  1 puff Inhalation Daily   heparin  5,000 Units Subcutaneous Q8H   hydrochlorothiazide  25 mg Oral Daily   insulin aspart  0-15 Units Subcutaneous TID WC   insulin aspart  0-5 Units Subcutaneous QHS   losartan  100 mg Oral Daily   methocarbamol  500 mg Oral BID   Continuous Infusions:  PRN Meds: acetaminophen, albuterol, ALPRAZolam, labetalol, nitroGLYCERIN, ondansetron (ZOFRAN) IV, oxyCODONE-acetaminophen **AND** oxyCODONE   Vital Signs    Vitals:   08/23/22 0621 08/23/22 0633 08/23/22 0813 08/23/22 0828  BP: (!) 178/82  (!) 147/76 (!) 147/76  Pulse: 65  67   Resp: 16  20   Temp: 98.1 F (36.7 C)  97.8 F (36.6 C)   TempSrc: Oral  Oral   SpO2: 93%  94%   Weight:  84.6 kg    Height:       No intake or output data in the 24 hours ending 08/23/22 0935    08/23/2022    6:33 AM 08/23/2022   12:07 AM 08/22/2022    8:46 PM  Last 3 Weights  Weight (lbs) 186 lb 8.2 oz 187 lb 11.2 oz 187 lb 11.2 oz  Weight (kg) 84.6 kg 85.14 kg 85.14 kg      Telemetry    Sinus rhythm.  No events.- Personally Reviewed  ECG    Sinus rhythm at rate 67 bpm.  QTc 490 ms.  Nonspecific T wave abnormalities. - Personally Reviewed  Physical Exam   VS:  BP (!) 147/76 (BP Location: Left Arm)   Pulse 67   Temp 97.8 F (36.6 C) (Oral)   Resp 20   Ht 5' 5"$  (1.651 m)   Wt 84.6 kg   SpO2 94%   BMI 31.04 kg/m  , BMI Body mass index is 31.04 kg/m. GENERAL:  Well appearing HEENT: Pupils equal round and reactive, fundi not visualized, oral mucosa unremarkable NECK:  No jugular venous  distention, waveform within normal limits, carotid upstroke brisk and symmetric, no bruits, no thyromegaly LUNGS:  Scattered expiratory wheezes HEART:  RRR.  PMI not displaced or sustained,S1 and S2 within normal limits, no S3, no S4, no clicks, no rubs, no murmurs ABD:  Flat, positive bowel sounds normal in frequency in pitch, no bruits, no rebound, no guarding, no midline pulsatile mass, no hepatomegaly, no splenomegaly EXT:  2 plus pulses throughout, no edema, no cyanosis no clubbing SKIN:  No rashes no nodules NEURO:  Cranial nerves II through XII grossly intact, motor grossly intact throughout PSYCH:  Cognitively intact, oriented to person place and time  Labs    High Sensitivity Troponin:   Recent Labs  Lab 08/22/22 2109 08/22/22 2304  TROPONINIHS 8 8     Chemistry Recent Labs  Lab 08/22/22 2125 08/23/22 0041  NA 144 139  K 4.6 4.0  CL 108 108  CO2 26 22  GLUCOSE 123* 126*  BUN 19 18  CREATININE 0.91 0.79  CALCIUM 9.5 8.8*  PROT 6.3*  --   ALBUMIN  3.4*  --   AST 16  --   ALT 12  --   ALKPHOS 68  --   BILITOT 0.3  --   GFRNONAA >60 >60  ANIONGAP 10 9    Lipids  Recent Labs  Lab 08/23/22 0041  CHOL 179  TRIG 115  HDL 47  LDLCALC 109*  CHOLHDL 3.8    Hematology Recent Labs  Lab 08/22/22 2125  WBC 8.5  RBC 5.19*  HGB 13.2  HCT 43.1  MCV 83.0  MCH 25.4*  MCHC 30.6  RDW 17.9*  PLT 226   Thyroid  Recent Labs  Lab 08/23/22 0041  TSH 4.199    BNP Recent Labs  Lab 08/22/22 2125  BNP 213.4*    DDimer No results for input(s): "DDIMER" in the last 168 hours.   Radiology    DG CHEST PORT 1 VIEW  Result Date: 08/22/2022 CLINICAL DATA:  Chest pain EXAM: PORTABLE CHEST 1 VIEW COMPARISON:  06/13/2021, CT 08/26/2021, 05/26/2022 FINDINGS: Post sternotomy changes. No consolidation or pleural effusion. Mild bronchitic changes in the lower lungs. Borderline cardiac enlargement. Aortic atherosclerosis. No pneumothorax IMPRESSION: No active disease. Mild  bronchitic changes in the lower lungs. Electronically Signed   By: Donavan Foil M.D.   On: 08/22/2022 23:31    Cardiac Studies   Echo Pending  LHC pending   Patient Profile     57 y.o. female  with CAD s/p CABG, PAF, PVD, bipolar disorder, depression, HTN, HL, DM and tobacco abuse admitted with CP.    Assessment & Plan    # Unstable angina:  # CAD status post CABG: # Hyperlipidemia Patient admitted with UA.  Eliquis oh hold with plan for [facility] Monday.  Cardiac enzymes remain negative.  Continue aspirin, clopidogrel, and carvedilol.  Lipids are not at goal.  Increase rosuvastatin to 10 mg.  Will need to determine why she is on such a low-dose and titrate accordingly.  # PAF:  She is maintaining sinus rhythm.    Eliquis is on hold as above.  She is currently on subcutaneous heparin.  Would start IV heparin if she goes back in atrial fibrillation.  # Tobacco abuse: Cessation advised.  Continue Wellbutrin.  # Hypertension: Blood pressure well-controlled on amlodipine, carvedilol, HCTZ, and losartan.  Consider consolidating amlodipine, HCTZ, and ARB for reduce pill burden.  # PAD: Status post R SFA angioplasty/PCI.  Medications as above.    For questions or updates, please contact [place] Please consult [email or web address] for contact info under        Signed, Skeet Latch, MD  08/23/2022, 9:35 AM

## 2022-08-23 NOTE — Plan of Care (Signed)
  Problem: Education: Goal: Knowledge of General Education information will improve Description: Including pain rating scale, medication(s)/side effects and non-pharmacologic comfort measures Outcome: Progressing   Problem: Health Behavior/Discharge Planning: Goal: Ability to manage health-related needs will improve Outcome: Progressing   Problem: Clinical Measurements: Goal: Ability to maintain clinical measurements within normal limits will improve Outcome: Progressing Goal: Will remain free from infection Outcome: Progressing Goal: Diagnostic test results will improve Outcome: Progressing Goal: Respiratory complications will improve Outcome: Progressing Goal: Cardiovascular complication will be avoided Outcome: Progressing   Problem: Activity: Goal: Risk for activity intolerance will decrease Outcome: Progressing   Problem: Nutrition: Goal: Adequate nutrition will be maintained Outcome: Progressing   Problem: Coping: Goal: Level of anxiety will decrease Outcome: Progressing   Problem: Elimination: Goal: Will not experience complications related to bowel motility Outcome: Progressing Goal: Will not experience complications related to urinary retention Outcome: Progressing   Problem: Pain Managment: Goal: General experience of comfort will improve Outcome: Progressing   Problem: Education: Goal: Ability to describe self-care measures that may prevent or decrease complications (Diabetes Survival Skills Education) will improve Outcome: Progressing Goal: Individualized Educational Video(s) Outcome: Progressing   Problem: Coping: Goal: Ability to adjust to condition or change in health will improve Outcome: Progressing

## 2022-08-24 ENCOUNTER — Encounter (HOSPITAL_COMMUNITY): Payer: Self-pay | Admitting: Cardiovascular Disease

## 2022-08-24 ENCOUNTER — Other Ambulatory Visit: Payer: Self-pay

## 2022-08-24 DIAGNOSIS — I200 Unstable angina: Secondary | ICD-10-CM | POA: Diagnosis not present

## 2022-08-24 LAB — LIPOPROTEIN A (LPA): Lipoprotein (a): 149.5 nmol/L — ABNORMAL HIGH (ref <75.0)

## 2022-08-24 LAB — GLUCOSE, CAPILLARY
Glucose-Capillary: 136 mg/dL — ABNORMAL HIGH (ref 70–99)
Glucose-Capillary: 139 mg/dL — ABNORMAL HIGH (ref 70–99)
Glucose-Capillary: 128 mg/dL — ABNORMAL HIGH (ref 70–99)
Glucose-Capillary: 160 mg/dL — ABNORMAL HIGH (ref 70–99)

## 2022-08-24 LAB — MRSA NEXT GEN BY PCR, NASAL: MRSA by PCR Next Gen: NOT DETECTED

## 2022-08-24 MED ORDER — SODIUM CHLORIDE 0.9% FLUSH
3.0000 mL | Freq: Two times a day (BID) | INTRAVENOUS | Status: DC
  Administered 2022-08-24 (×2): 3 mL via INTRAVENOUS

## 2022-08-24 MED ORDER — SODIUM CHLORIDE 0.9 % WEIGHT BASED INFUSION
1.0000 mL/kg/h | INTRAVENOUS | Status: DC
  Administered 2022-08-25: 1 mL/kg/h via INTRAVENOUS

## 2022-08-24 MED ORDER — SODIUM CHLORIDE 0.9 % IV SOLN: 250.0000 mL | INTRAVENOUS | Status: DC | PRN

## 2022-08-24 MED ORDER — ASPIRIN 81 MG PO CHEW
81.0000 mg | CHEWABLE_TABLET | ORAL | Status: AC
  Administered 2022-08-25: 81 mg via ORAL
  Filled 2022-08-24: qty 1

## 2022-08-24 MED ORDER — SODIUM CHLORIDE 0.9% FLUSH: 3.0000 mL | INTRAVENOUS | Status: DC | PRN

## 2022-08-24 MED ORDER — SODIUM CHLORIDE 0.9 % WEIGHT BASED INFUSION
3.0000 mL/kg/h | INTRAVENOUS | Status: DC
  Administered 2022-08-25: 3.004 mL/kg/h via INTRAVENOUS

## 2022-08-24 NOTE — Progress Notes (Signed)
Pre-procedure consent form obtained and signed by patient for cardiac catheterization tomorrow. Witnessed by this Therapist, sports. Placed in pt chart.

## 2022-08-24 NOTE — Plan of Care (Signed)
  Problem: Education: Goal: Knowledge of General Education information will improve Description: Including pain rating scale, medication(s)/side effects and non-pharmacologic comfort measures Outcome: Progressing   Problem: Health Behavior/Discharge Planning: Goal: Ability to manage health-related needs will improve Outcome: Progressing   Problem: Clinical Measurements: Goal: Ability to maintain clinical measurements within normal limits will improve Outcome: Progressing Goal: Will remain free from infection Outcome: Progressing Goal: Diagnostic test results will improve Outcome: Progressing Goal: Respiratory complications will improve Outcome: Progressing Goal: Cardiovascular complication will be avoided Outcome: Progressing   Problem: Activity: Goal: Risk for activity intolerance will decrease Outcome: Progressing   Problem: Elimination: Goal: Will not experience complications related to bowel motility Outcome: Progressing Goal: Will not experience complications related to urinary retention Outcome: Progressing   Problem: Skin Integrity: Goal: Risk for impaired skin integrity will decrease Outcome: Progressing   Problem: Activity: Goal: Ability to tolerate increased activity will improve Outcome: Progressing   Problem: Cardiac: Goal: Ability to achieve and maintain adequate cardiovascular perfusion will improve Outcome: Progressing   Problem: Health Behavior/Discharge Planning: Goal: Ability to safely manage health-related needs after discharge will improve Outcome: Progressing   Problem: Education: Goal: Ability to describe self-care measures that may prevent or decrease complications (Diabetes Survival Skills Education) will improve Outcome: Progressing   Problem: Coping: Goal: Ability to adjust to condition or change in health will improve Outcome: Progressing   Problem: Fluid Volume: Goal: Ability to maintain a balanced intake and output will  improve Outcome: Progressing   Problem: Health Behavior/Discharge Planning: Goal: Ability to identify and utilize available resources and services will improve Outcome: Progressing Goal: Ability to manage health-related needs will improve Outcome: Progressing   Problem: Metabolic: Goal: Ability to maintain appropriate glucose levels will improve Outcome: Progressing   Problem: Nutritional: Goal: Progress toward achieving an optimal weight will improve Outcome: Progressing   Problem: Tissue Perfusion: Goal: Adequacy of tissue perfusion will improve Outcome: Progressing   Problem: Activity: Goal: Ability to return to baseline activity level will improve Outcome: Progressing   Problem: Cardiovascular: Goal: Ability to achieve and maintain adequate cardiovascular perfusion will improve Outcome: Progressing   Problem: Health Behavior/Discharge Planning: Goal: Ability to safely manage health-related needs after discharge will improve Outcome: Progressing

## 2022-08-24 NOTE — H&P (View-Only) (Signed)
Rounding Note    Patient Name: Rachael Becker Date of Encounter: 08/24/2022  [place] Cardiologist: [name], MD   Subjective   Feeling well.  No chest pain at this time.  Feeling anxious and wants to go home.   Inpatient Medications    Scheduled Meds:  amLODipine  5 mg Oral Daily   buPROPion ER  200 mg Oral Daily   carvedilol  25 mg Oral BID   clopidogrel  75 mg Oral Q breakfast   fluticasone furoate-vilanterol  1 puff Inhalation Daily   heparin  5,000 Units Subcutaneous Q8H   hydrochlorothiazide  25 mg Oral Daily   insulin aspart  0-15 Units Subcutaneous TID WC   insulin aspart  0-5 Units Subcutaneous QHS   losartan  100 mg Oral Daily   methocarbamol  500 mg Oral BID   nicotine  14 mg Transdermal Daily   rosuvastatin  10 mg Oral Daily   Continuous Infusions:  PRN Meds: acetaminophen, albuterol, ALPRAZolam, labetalol, nitroGLYCERIN, ondansetron (ZOFRAN) IV, oxyCODONE-acetaminophen **AND** oxyCODONE   Vital Signs    Vitals:   08/24/22 0034 08/24/22 0621 08/24/22 0811 08/24/22 1007  BP: (!) 146/86 (!) 170/92 109/74 (!) 162/86  Pulse: 67 69 65 66  Resp: 18 18 20 20  $ Temp: 97.9 F (36.6 C) 98.1 F (36.7 C) 98 F (36.7 C)   TempSrc: Oral Oral Oral   SpO2: 94% 94% 97%   Weight: 82.9 kg     Height:        Intake/Output Summary (Last 24 hours) at 08/24/2022 1028 Last data filed at 08/24/2022 0852 Gross per 24 hour  Intake 860 ml  Output --  Net 860 ml      08/24/2022   12:34 AM 08/23/2022    6:33 AM 08/23/2022   12:07 AM  Last 3 Weights  Weight (lbs) 182 lb 12.8 oz 186 lb 8.2 oz 187 lb 11.2 oz  Weight (kg) 82.918 kg 84.6 kg 85.14 kg      Telemetry    Sinus rhythm.  No events.- Personally Reviewed  ECG    Sinus rhythm at rate 67 bpm.  QTc 490 ms.  Nonspecific T wave abnormalities. - Personally Reviewed  Physical Exam   VS:  BP (!) 162/86 (BP Location: Right Arm)   Pulse 66   Temp 98 F (36.7 C) (Oral)   Resp 20   Ht 5' 5"$   (1.651 m)   Wt 82.9 kg   SpO2 97%   BMI 30.42 kg/m  , BMI Body mass index is 30.42 kg/m. GENERAL:  Well appearing HEENT: Pupils equal round and reactive, fundi not visualized, oral mucosa unremarkable NECK:  No jugular venous distention, waveform within normal limits, carotid upstroke brisk and symmetric, no bruits, no thyromegaly LUNGS:  Scattered expiratory wheezes HEART:  RRR.  PMI not displaced or sustained,S1 and S2 within normal limits, no S3, no S4, no clicks, no rubs, no murmurs ABD:  Flat, positive bowel sounds normal in frequency in pitch, no bruits, no rebound, no guarding, no midline pulsatile mass, no hepatomegaly, no splenomegaly EXT:  2 plus pulses throughout, no edema, no cyanosis no clubbing SKIN:  No rashes no nodules NEURO:  Cranial nerves II through XII grossly intact, motor grossly intact throughout PSYCH:  Cognitively intact, oriented to person place and time  Labs    High Sensitivity Troponin:   Recent Labs  Lab 08/22/22 2109 08/22/22 2304  TROPONINIHS 8 8     Chemistry Recent Labs  Lab 08/22/22 2125 08/23/22 0041  NA 144 139  K 4.6 4.0  CL 108 108  CO2 26 22  GLUCOSE 123* 126*  BUN 19 18  CREATININE 0.91 0.79  CALCIUM 9.5 8.8*  PROT 6.3*  --   ALBUMIN 3.4*  --   AST 16  --   ALT 12  --   ALKPHOS 68  --   BILITOT 0.3  --   GFRNONAA >60 >60  ANIONGAP 10 9    Lipids  Recent Labs  Lab 08/23/22 0041  CHOL 179  TRIG 115  HDL 47  LDLCALC 109*  CHOLHDL 3.8    Hematology Recent Labs  Lab 08/22/22 2125  WBC 8.5  RBC 5.19*  HGB 13.2  HCT 43.1  MCV 83.0  MCH 25.4*  MCHC 30.6  RDW 17.9*  PLT 226   Thyroid  Recent Labs  Lab 08/23/22 0041  TSH 4.199    BNP Recent Labs  Lab 08/22/22 2125  BNP 213.4*    DDimer No results for input(s): "DDIMER" in the last 168 hours.   Radiology    ECHOCARDIOGRAM COMPLETE  Result Date: 08/23/2022    ECHOCARDIOGRAM REPORT   Patient Name:   BALEE IGNACIO Date of Exam: 08/23/2022 Medical  Rec #:  [id number]        Height:       65.0 in Accession #:    [id number]       Weight:       186.5 lb Date of Birth:  04-Feb-1966         BSA:          1.920 m Patient Age:    57 years         BP:           131/76 mmHg Patient Gender: F                HR:           60 bpm. Exam Location:  Inpatient Procedure: 2D Echo, Cardiac Doppler, Color Doppler and Strain Analysis Indications:    Chest Pain R07.9  History:        Patient has no prior history of Echocardiogram examinations.                 PAD, Signs/Symptoms:Fatigue; Risk Factors:Hypertension.  Sonographer:    Luane School RDCS Referring Phys: [id number] [place]  1. Abnormal global strain and inferior basal hypokinesis . Left ventricular ejection fraction, by estimation, is 55%. The left ventricle has normal function. The left ventricle demonstrates regional wall motion abnormalities (see scoring diagram/findings for description). There is moderate left ventricular hypertrophy. Left ventricular diastolic parameters are consistent with Grade II diastolic dysfunction (pseudonormalization). Elevated left ventricular end-diastolic pressure. The average left ventricular global longitudinal strain is -13.8 %. The global longitudinal strain is abnormal.  2. Right ventricular systolic function is normal. The right ventricular size is normal. There is normal pulmonary artery systolic pressure.  3. Left atrial size was mildly dilated.  4. The mitral valve is abnormal. Trivial mitral valve regurgitation. No evidence of mitral stenosis.  5. The aortic valve is tricuspid. There is mild calcification of the aortic valve. There is mild thickening of the aortic valve. Aortic valve regurgitation is not visualized. Aortic valve sclerosis is present, with no evidence of aortic valve stenosis.  6. The inferior vena cava is normal in size with greater than 50% respiratory variability, suggesting right atrial pressure of 3 mmHg. FINDINGS  Left Ventricle: Abnormal global  strain and inferior basal hypokinesis. Left ventricular ejection fraction, by estimation, is 55%. The left ventricle has normal function. The left ventricle demonstrates regional wall motion abnormalities. The average left ventricular global longitudinal strain is -13.8 %. The global longitudinal strain is abnormal. The left ventricular internal cavity size was normal in size. There is moderate left ventricular hypertrophy. Left ventricular diastolic parameters are consistent with Grade II diastolic dysfunction (pseudonormalization). Elevated left ventricular end-diastolic pressure. Right Ventricle: The right ventricular size is normal. No increase in right ventricular wall thickness. Right ventricular systolic function is normal. There is normal pulmonary artery systolic pressure. The tricuspid regurgitant velocity is 1.72 m/s, and  with an assumed right atrial pressure of 3 mmHg, the estimated right ventricular systolic pressure is [id number] mmHg. Left Atrium: Left atrial size was mildly dilated. Right Atrium: Right atrial size was normal in size. Pericardium: There is no evidence of pericardial effusion. Mitral Valve: The mitral valve is abnormal. There is mild thickening of the mitral valve leaflet(s). There is mild calcification of the mitral valve leaflet(s). Mild mitral annular calcification. Trivial mitral valve regurgitation. No evidence of mitral valve stenosis. Tricuspid Valve: The tricuspid valve is normal in structure. Tricuspid valve regurgitation is mild . No evidence of tricuspid stenosis. Aortic Valve: The aortic valve is tricuspid. There is mild calcification of the aortic valve. There is mild thickening of the aortic valve. Aortic valve regurgitation is not visualized. Aortic valve sclerosis is present, with no evidence of aortic valve stenosis. Pulmonic Valve: The pulmonic valve was normal in structure. Pulmonic valve regurgitation is not visualized. No evidence of pulmonic stenosis. Aorta: The aortic  root is normal in size and structure. Venous: The inferior vena cava is normal in size with greater than 50% respiratory variability, suggesting right atrial pressure of 3 mmHg. IAS/Shunts: The interatrial septum was not well visualized.  LEFT VENTRICLE PLAX 2D LVIDd:         4.60 cm   Diastology LVIDs:         3.10 cm   LV e' medial:    5.98 cm/s LV PW:         1.50 cm   LV E/e' medial:  17.6 LV IVS:        1.50 cm   LV e' lateral:   6.64 cm/s LVOT diam:     2.00 cm   LV E/e' lateral: 15.8 LV SV:         63 LV SV Index:   33        2D Longitudinal Strain LVOT Area:     3.14 cm  2D Strain GLS Avg:     -13.8 %  RIGHT VENTRICLE             IVC RV S prime:     11.00 cm/s  IVC diam: 1.90 cm TAPSE (M-mode): 1.7 cm LEFT ATRIUM             Index        RIGHT ATRIUM           Index LA diam:        3.90 cm 2.03 cm/m   RA Area:     17.70 cm LA Vol (A2C):   77.2 ml 40.20 ml/m  RA Volume:   49.70 ml  25.88 ml/m LA Vol (A4C):   54.5 ml 28.38 ml/m LA Biplane Vol: 64.5 ml 33.59 ml/m  AORTIC VALVE LVOT Vmax:   101.00 cm/s LVOT Vmean:  58.700 cm/s LVOT VTI:    0.202 m  AORTA Ao Root diam: 2.90 cm Ao Asc diam:  3.30 cm Ao Desc diam: 2.50 cm MITRAL VALVE                TRICUSPID VALVE MV Area (PHT): 3.65 cm     TR Peak grad:   11.8 mmHg MV Decel Time: 208 msec     TR Vmax:        172.00 cm/s MV E velocity: 105.00 cm/s MV A velocity: 79.70 cm/s   SHUNTS MV E/A ratio:  1.32         Systemic VTI:  0.20 m                             Systemic Diam: 2.00 cm Jenkins Rouge MD Electronically signed by Jenkins Rouge MD Signature Date/Time: 08/23/2022/2:55:25 PM    Final    DG CHEST PORT 1 VIEW  Result Date: 08/22/2022 CLINICAL DATA:  Chest pain EXAM: PORTABLE CHEST 1 VIEW COMPARISON:  06/13/2021, CT 08/26/2021, 05/26/2022 FINDINGS: Post sternotomy changes. No consolidation or pleural effusion. Mild bronchitic changes in the lower lungs. Borderline cardiac enlargement. Aortic atherosclerosis. No pneumothorax IMPRESSION: No active  disease. Mild bronchitic changes in the lower lungs. Electronically Signed   By: Donavan Foil M.D.   On: 08/22/2022 23:31    Cardiac Studies   Echo 08/23/21: IMPRESSIONS     1. Abnormal global strain and inferior basal hypokinesis . Left  ventricular ejection fraction, by estimation, is 55%. The left ventricle  has normal function. The left ventricle demonstrates regional wall motion  abnormalities (see scoring  diagram/findings for description). There is moderate left ventricular  hypertrophy. Left ventricular diastolic parameters are consistent with  Grade II diastolic dysfunction (pseudonormalization). Elevated left  ventricular end-diastolic pressure. The  average left ventricular global longitudinal strain is -13.8 %. The global  longitudinal strain is abnormal.   2. Right ventricular systolic function is normal. The right ventricular  size is normal. There is normal pulmonary artery systolic pressure.   3. Left atrial size was mildly dilated.   4. The mitral valve is abnormal. Trivial mitral valve regurgitation. No  evidence of mitral stenosis.   5. The aortic valve is tricuspid. There is mild calcification of the  aortic valve. There is mild thickening of the aortic valve. Aortic valve  regurgitation is not visualized. Aortic valve sclerosis is present, with  no evidence of aortic valve stenosis.   6. The inferior vena cava is normal in size with greater than 50%  respiratory variability, suggesting right atrial pressure of 3 mmHg.   LHC pending   Patient Profile     57 y.o. female  with CAD s/p CABG, PAF, PVD, bipolar disorder, depression, HTN, HL, DM and tobacco abuse admitted with CP.    Assessment & Plan    # Unstable angina:  # CAD status post CABG: # Hyperlipidemia Patient admitted with UA.  Eliquis oh hold with plan for [facility] Monday.  Cardiac enzymes remain negative.  Continue aspirin, clopidogrel, and carvedilol.  Lipids are not at goal.  Increased rosuvastatin  to 10 mg.  Will need to determine why she is on such a low-dose and titrate accordingly.  # PAF:  She is maintaining sinus rhythm.    Eliquis is on hold as above.  She is currently on subcutaneous heparin.  Would start IV heparin if she goes back in atrial fibrillation.  #  Tobacco abuse: Cessation advised.  Continue Wellbutrin.  Continue nicotine replacement patch.  # Hypertension: Blood pressure well-controlled on amlodipine, carvedilol, HCTZ, and losartan.  Consider consolidating amlodipine, HCTZ, and ARB for reduce pill burden.  # PAD: Status post R SFA angioplasty/PCI.  Medications as above.    For questions or updates, please contact [facility] Please consult [email or web address] for contact info under        Signed, Skeet Latch, MD  08/24/2022, 10:28 AM

## 2022-08-24 NOTE — Progress Notes (Signed)
PIV consult: per RN: pt needs R AC site for cath 2/19. Recommend placing line in the AM to ensure patency for procedure and not limit pt's movement/ comfort overnight.

## 2022-08-24 NOTE — Progress Notes (Signed)
Rounding Note    Patient Name: Rachael Becker Date of Encounter: 08/24/2022  [place] Cardiologist: [name], MD   Subjective   Feeling well.  No chest pain at this time.  Feeling anxious and wants to go home.   Inpatient Medications    Scheduled Meds:  amLODipine  5 mg Oral Daily   buPROPion ER  200 mg Oral Daily   carvedilol  25 mg Oral BID   clopidogrel  75 mg Oral Q breakfast   fluticasone furoate-vilanterol  1 puff Inhalation Daily   heparin  5,000 Units Subcutaneous Q8H   hydrochlorothiazide  25 mg Oral Daily   insulin aspart  0-15 Units Subcutaneous TID WC   insulin aspart  0-5 Units Subcutaneous QHS   losartan  100 mg Oral Daily   methocarbamol  500 mg Oral BID   nicotine  14 mg Transdermal Daily   rosuvastatin  10 mg Oral Daily   Continuous Infusions:  PRN Meds: acetaminophen, albuterol, ALPRAZolam, labetalol, nitroGLYCERIN, ondansetron (ZOFRAN) IV, oxyCODONE-acetaminophen **AND** oxyCODONE   Vital Signs    Vitals:   08/24/22 0034 08/24/22 0621 08/24/22 0811 08/24/22 1007  BP: (!) 146/86 (!) 170/92 109/74 (!) 162/86  Pulse: 67 69 65 66  Resp: 18 18 20 20  $ Temp: 97.9 F (36.6 C) 98.1 F (36.7 C) 98 F (36.7 C)   TempSrc: Oral Oral Oral   SpO2: 94% 94% 97%   Weight: 82.9 kg     Height:        Intake/Output Summary (Last 24 hours) at 08/24/2022 1028 Last data filed at 08/24/2022 0852 Gross per 24 hour  Intake 860 ml  Output --  Net 860 ml      08/24/2022   12:34 AM 08/23/2022    6:33 AM 08/23/2022   12:07 AM  Last 3 Weights  Weight (lbs) 182 lb 12.8 oz 186 lb 8.2 oz 187 lb 11.2 oz  Weight (kg) 82.918 kg 84.6 kg 85.14 kg      Telemetry    Sinus rhythm.  No events.- Personally Reviewed  ECG    Sinus rhythm at rate 67 bpm.  QTc 490 ms.  Nonspecific T wave abnormalities. - Personally Reviewed  Physical Exam   VS:  BP (!) 162/86 (BP Location: Right Arm)   Pulse 66   Temp 98 F (36.7 C) (Oral)   Resp 20   Ht 5' 5"$   (1.651 m)   Wt 82.9 kg   SpO2 97%   BMI 30.42 kg/m  , BMI Body mass index is 30.42 kg/m. GENERAL:  Well appearing HEENT: Pupils equal round and reactive, fundi not visualized, oral mucosa unremarkable NECK:  No jugular venous distention, waveform within normal limits, carotid upstroke brisk and symmetric, no bruits, no thyromegaly LUNGS:  Scattered expiratory wheezes HEART:  RRR.  PMI not displaced or sustained,S1 and S2 within normal limits, no S3, no S4, no clicks, no rubs, no murmurs ABD:  Flat, positive bowel sounds normal in frequency in pitch, no bruits, no rebound, no guarding, no midline pulsatile mass, no hepatomegaly, no splenomegaly EXT:  2 plus pulses throughout, no edema, no cyanosis no clubbing SKIN:  No rashes no nodules NEURO:  Cranial nerves II through XII grossly intact, motor grossly intact throughout PSYCH:  Cognitively intact, oriented to person place and time  Labs    High Sensitivity Troponin:   Recent Labs  Lab 08/22/22 2109 08/22/22 2304  TROPONINIHS 8 8     Chemistry Recent Labs  Lab 08/22/22 2125 08/23/22 0041  NA 144 139  K 4.6 4.0  CL 108 108  CO2 26 22  GLUCOSE 123* 126*  BUN 19 18  CREATININE 0.91 0.79  CALCIUM 9.5 8.8*  PROT 6.3*  --   ALBUMIN 3.4*  --   AST 16  --   ALT 12  --   ALKPHOS 68  --   BILITOT 0.3  --   GFRNONAA >60 >60  ANIONGAP 10 9    Lipids  Recent Labs  Lab 08/23/22 0041  CHOL 179  TRIG 115  HDL 47  LDLCALC 109*  CHOLHDL 3.8    Hematology Recent Labs  Lab 08/22/22 2125  WBC 8.5  RBC 5.19*  HGB 13.2  HCT 43.1  MCV 83.0  MCH 25.4*  MCHC 30.6  RDW 17.9*  PLT 226   Thyroid  Recent Labs  Lab 08/23/22 0041  TSH 4.199    BNP Recent Labs  Lab 08/22/22 2125  BNP 213.4*    DDimer No results for input(s): "DDIMER" in the last 168 hours.   Radiology    ECHOCARDIOGRAM COMPLETE  Result Date: 08/23/2022    ECHOCARDIOGRAM REPORT   Patient Name:   Rachael Becker Date of Exam: 08/23/2022 Medical  Rec #:  [id number]        Height:       65.0 in Accession #:    [id number]       Weight:       186.5 lb Date of Birth:  1965/12/29         BSA:          1.920 m Patient Age:    57 years         BP:           131/76 mmHg Patient Gender: F                HR:           60 bpm. Exam Location:  Inpatient Procedure: 2D Echo, Cardiac Doppler, Color Doppler and Strain Analysis Indications:    Chest Pain R07.9  History:        Patient has no prior history of Echocardiogram examinations.                 PAD, Signs/Symptoms:Fatigue; Risk Factors:Hypertension.  Sonographer:    Luane School RDCS Referring Phys: [id number] [place]  1. Abnormal global strain and inferior basal hypokinesis . Left ventricular ejection fraction, by estimation, is 55%. The left ventricle has normal function. The left ventricle demonstrates regional wall motion abnormalities (see scoring diagram/findings for description). There is moderate left ventricular hypertrophy. Left ventricular diastolic parameters are consistent with Grade II diastolic dysfunction (pseudonormalization). Elevated left ventricular end-diastolic pressure. The average left ventricular global longitudinal strain is -13.8 %. The global longitudinal strain is abnormal.  2. Right ventricular systolic function is normal. The right ventricular size is normal. There is normal pulmonary artery systolic pressure.  3. Left atrial size was mildly dilated.  4. The mitral valve is abnormal. Trivial mitral valve regurgitation. No evidence of mitral stenosis.  5. The aortic valve is tricuspid. There is mild calcification of the aortic valve. There is mild thickening of the aortic valve. Aortic valve regurgitation is not visualized. Aortic valve sclerosis is present, with no evidence of aortic valve stenosis.  6. The inferior vena cava is normal in size with greater than 50% respiratory variability, suggesting right atrial pressure of 3 mmHg. FINDINGS  Left Ventricle: Abnormal global  strain and inferior basal hypokinesis. Left ventricular ejection fraction, by estimation, is 55%. The left ventricle has normal function. The left ventricle demonstrates regional wall motion abnormalities. The average left ventricular global longitudinal strain is -13.8 %. The global longitudinal strain is abnormal. The left ventricular internal cavity size was normal in size. There is moderate left ventricular hypertrophy. Left ventricular diastolic parameters are consistent with Grade II diastolic dysfunction (pseudonormalization). Elevated left ventricular end-diastolic pressure. Right Ventricle: The right ventricular size is normal. No increase in right ventricular wall thickness. Right ventricular systolic function is normal. There is normal pulmonary artery systolic pressure. The tricuspid regurgitant velocity is 1.72 m/s, and  with an assumed right atrial pressure of 3 mmHg, the estimated right ventricular systolic pressure is [id number] mmHg. Left Atrium: Left atrial size was mildly dilated. Right Atrium: Right atrial size was normal in size. Pericardium: There is no evidence of pericardial effusion. Mitral Valve: The mitral valve is abnormal. There is mild thickening of the mitral valve leaflet(s). There is mild calcification of the mitral valve leaflet(s). Mild mitral annular calcification. Trivial mitral valve regurgitation. No evidence of mitral valve stenosis. Tricuspid Valve: The tricuspid valve is normal in structure. Tricuspid valve regurgitation is mild . No evidence of tricuspid stenosis. Aortic Valve: The aortic valve is tricuspid. There is mild calcification of the aortic valve. There is mild thickening of the aortic valve. Aortic valve regurgitation is not visualized. Aortic valve sclerosis is present, with no evidence of aortic valve stenosis. Pulmonic Valve: The pulmonic valve was normal in structure. Pulmonic valve regurgitation is not visualized. No evidence of pulmonic stenosis. Aorta: The aortic  root is normal in size and structure. Venous: The inferior vena cava is normal in size with greater than 50% respiratory variability, suggesting right atrial pressure of 3 mmHg. IAS/Shunts: The interatrial septum was not well visualized.  LEFT VENTRICLE PLAX 2D LVIDd:         4.60 cm   Diastology LVIDs:         3.10 cm   LV e' medial:    5.98 cm/s LV PW:         1.50 cm   LV E/e' medial:  17.6 LV IVS:        1.50 cm   LV e' lateral:   6.64 cm/s LVOT diam:     2.00 cm   LV E/e' lateral: 15.8 LV SV:         63 LV SV Index:   33        2D Longitudinal Strain LVOT Area:     3.14 cm  2D Strain GLS Avg:     -13.8 %  RIGHT VENTRICLE             IVC RV S prime:     11.00 cm/s  IVC diam: 1.90 cm TAPSE (M-mode): 1.7 cm LEFT ATRIUM             Index        RIGHT ATRIUM           Index LA diam:        3.90 cm 2.03 cm/m   RA Area:     17.70 cm LA Vol (A2C):   77.2 ml 40.20 ml/m  RA Volume:   49.70 ml  25.88 ml/m LA Vol (A4C):   54.5 ml 28.38 ml/m LA Biplane Vol: 64.5 ml 33.59 ml/m  AORTIC VALVE LVOT Vmax:   101.00 cm/s LVOT Vmean:  58.700 cm/s LVOT VTI:    0.202 m  AORTA Ao Root diam: 2.90 cm Ao Asc diam:  3.30 cm Ao Desc diam: 2.50 cm MITRAL VALVE                TRICUSPID VALVE MV Area (PHT): 3.65 cm     TR Peak grad:   11.8 mmHg MV Decel Time: 208 msec     TR Vmax:        172.00 cm/s MV E velocity: 105.00 cm/s MV A velocity: 79.70 cm/s   SHUNTS MV E/A ratio:  1.32         Systemic VTI:  0.20 m                             Systemic Diam: 2.00 cm Jenkins Rouge MD Electronically signed by Jenkins Rouge MD Signature Date/Time: 08/23/2022/2:55:25 PM    Final    DG CHEST PORT 1 VIEW  Result Date: 08/22/2022 CLINICAL DATA:  Chest pain EXAM: PORTABLE CHEST 1 VIEW COMPARISON:  06/13/2021, CT 08/26/2021, 05/26/2022 FINDINGS: Post sternotomy changes. No consolidation or pleural effusion. Mild bronchitic changes in the lower lungs. Borderline cardiac enlargement. Aortic atherosclerosis. No pneumothorax IMPRESSION: No active  disease. Mild bronchitic changes in the lower lungs. Electronically Signed   By: Donavan Foil M.D.   On: 08/22/2022 23:31    Cardiac Studies   Echo 08/23/21: IMPRESSIONS     1. Abnormal global strain and inferior basal hypokinesis . Left  ventricular ejection fraction, by estimation, is 55%. The left ventricle  has normal function. The left ventricle demonstrates regional wall motion  abnormalities (see scoring  diagram/findings for description). There is moderate left ventricular  hypertrophy. Left ventricular diastolic parameters are consistent with  Grade II diastolic dysfunction (pseudonormalization). Elevated left  ventricular end-diastolic pressure. The  average left ventricular global longitudinal strain is -13.8 %. The global  longitudinal strain is abnormal.   2. Right ventricular systolic function is normal. The right ventricular  size is normal. There is normal pulmonary artery systolic pressure.   3. Left atrial size was mildly dilated.   4. The mitral valve is abnormal. Trivial mitral valve regurgitation. No  evidence of mitral stenosis.   5. The aortic valve is tricuspid. There is mild calcification of the  aortic valve. There is mild thickening of the aortic valve. Aortic valve  regurgitation is not visualized. Aortic valve sclerosis is present, with  no evidence of aortic valve stenosis.   6. The inferior vena cava is normal in size with greater than 50%  respiratory variability, suggesting right atrial pressure of 3 mmHg.   LHC pending   Patient Profile     57 y.o. female  with CAD s/p CABG, PAF, PVD, bipolar disorder, depression, HTN, HL, DM and tobacco abuse admitted with CP.    Assessment & Plan    # Unstable angina:  # CAD status post CABG: # Hyperlipidemia Patient admitted with UA.  Eliquis oh hold with plan for [facility] Monday.  Cardiac enzymes remain negative.  Continue aspirin, clopidogrel, and carvedilol.  Lipids are not at goal.  Increased rosuvastatin  to 10 mg.  Will need to determine why she is on such a low-dose and titrate accordingly.  # PAF:  She is maintaining sinus rhythm.    Eliquis is on hold as above.  She is currently on subcutaneous heparin.  Would start IV heparin if she goes back in atrial fibrillation.  #  Tobacco abuse: Cessation advised.  Continue Wellbutrin.  Continue nicotine replacement patch.  # Hypertension: Blood pressure well-controlled on amlodipine, carvedilol, HCTZ, and losartan.  Consider consolidating amlodipine, HCTZ, and ARB for reduce pill burden.  # PAD: Status post R SFA angioplasty/PCI.  Medications as above.    For questions or updates, please contact [place] Please consult [email or web address] for contact info under        Signed, Skeet Latch, MD  08/24/2022, 10:28 AM

## 2022-08-25 ENCOUNTER — Inpatient Hospital Stay (HOSPITAL_COMMUNITY)
Admission: RE | Disposition: A | Discharge status: Home / Self Care | Payer: Self-pay | Source: Ambulatory Visit | Attending: Cardiovascular Disease

## 2022-08-25 ENCOUNTER — Encounter (HOSPITAL_COMMUNITY): Payer: Medicare HMO

## 2022-08-25 DIAGNOSIS — I200 Unstable angina: Secondary | ICD-10-CM | POA: Diagnosis not present

## 2022-08-25 DIAGNOSIS — I25110 Atherosclerotic heart disease of native coronary artery with unstable angina pectoris: Secondary | ICD-10-CM | POA: Diagnosis not present

## 2022-08-25 DIAGNOSIS — I480 Paroxysmal atrial fibrillation: Secondary | ICD-10-CM | POA: Diagnosis not present

## 2022-08-25 DIAGNOSIS — I5030 Unspecified diastolic (congestive) heart failure: Secondary | ICD-10-CM

## 2022-08-25 HISTORY — PX: AORTIC ARCH ANGIOGRAPHY: CATH118224

## 2022-08-25 HISTORY — PX: CORONARY STENT INTERVENTION: CATH118234

## 2022-08-25 HISTORY — PX: LEFT HEART CATH AND CORS/GRAFTS ANGIOGRAPHY: CATH118250

## 2022-08-25 HISTORY — DX: Unspecified diastolic (congestive) heart failure: I50.30

## 2022-08-25 LAB — POCT ACTIVATED CLOTTING TIME
Activated Clotting Time: 266 seconds
Activated Clotting Time: 309 seconds
Activated Clotting Time: 277 seconds
Activated Clotting Time: 217 seconds
Activated Clotting Time: 168 seconds

## 2022-08-25 LAB — GLUCOSE, CAPILLARY
Glucose-Capillary: 125 mg/dL — ABNORMAL HIGH (ref 70–99)
Glucose-Capillary: 142 mg/dL — ABNORMAL HIGH (ref 70–99)
Glucose-Capillary: 121 mg/dL — ABNORMAL HIGH (ref 70–99)
Glucose-Capillary: 171 mg/dL — ABNORMAL HIGH (ref 70–99)

## 2022-08-25 SURGERY — LEFT HEART CATH AND CORS/GRAFTS ANGIOGRAPHY
Anesthesia: LOCAL

## 2022-08-25 MED ORDER — HEPARIN SODIUM (PORCINE) 1000 UNIT/ML IJ SOLN
INTRAMUSCULAR | Status: DC | PRN
  Administered 2022-08-25: 3000 [IU] via INTRAVENOUS
  Administered 2022-08-25: 9000 [IU] via INTRAVENOUS

## 2022-08-25 MED ORDER — CLOPIDOGREL BISULFATE 300 MG PO TABS
ORAL_TABLET | ORAL | Status: AC
  Filled 2022-08-25: qty 1

## 2022-08-25 MED ORDER — LABETALOL HCL 5 MG/ML IV SOLN
10.0000 mg | INTRAVENOUS | Status: AC | PRN
  Administered 2022-08-25: 10 mg via INTRAVENOUS

## 2022-08-25 MED ORDER — SODIUM CHLORIDE 0.9 % IV SOLN: INTRAVENOUS | Status: AC

## 2022-08-25 MED ORDER — ONDANSETRON HCL 4 MG/2ML IJ SOLN
INTRAMUSCULAR | Status: AC
  Filled 2022-08-25: qty 2

## 2022-08-25 MED ORDER — NITROGLYCERIN 1 MG/10 ML FOR IR/CATH LAB
INTRA_ARTERIAL | Status: AC
  Filled 2022-08-25: qty 10

## 2022-08-25 MED ORDER — HYDRALAZINE HCL 20 MG/ML IJ SOLN
INTRAMUSCULAR | Status: AC
  Filled 2022-08-25: qty 1

## 2022-08-25 MED ORDER — MIDAZOLAM HCL 2 MG/2ML IJ SOLN
INTRAMUSCULAR | Status: AC
  Filled 2022-08-25: qty 2

## 2022-08-25 MED ORDER — MIDAZOLAM HCL 2 MG/2ML IJ SOLN
INTRAMUSCULAR | Status: DC | PRN
  Administered 2022-08-25: 1 mg via INTRAVENOUS

## 2022-08-25 MED ORDER — FENTANYL CITRATE (PF) 100 MCG/2ML IJ SOLN
INTRAMUSCULAR | Status: AC
  Filled 2022-08-25: qty 2

## 2022-08-25 MED ORDER — HEPARIN SODIUM (PORCINE) 1000 UNIT/ML IJ SOLN
INTRAMUSCULAR | Status: AC
  Filled 2022-08-25: qty 10

## 2022-08-25 MED ORDER — MORPHINE SULFATE (PF) 2 MG/ML IV SOLN: 2.0000 mg | INTRAVENOUS | Status: DC | PRN

## 2022-08-25 MED ORDER — SODIUM CHLORIDE 0.9% FLUSH: 3.0000 mL | INTRAVENOUS | Status: DC | PRN

## 2022-08-25 MED ORDER — HYDRALAZINE HCL 20 MG/ML IJ SOLN
INTRAMUSCULAR | Status: DC | PRN
  Administered 2022-08-25: 10 mg via INTRAVENOUS

## 2022-08-25 MED ORDER — CLOPIDOGREL BISULFATE 300 MG PO TABS
ORAL_TABLET | ORAL | Status: DC | PRN
  Administered 2022-08-25: 600 mg via ORAL

## 2022-08-25 MED ORDER — HEPARIN (PORCINE) 25000 UT/250ML-% IV SOLN
1300.0000 [IU]/h | INTRAVENOUS | Status: DC
  Administered 2022-08-25: 1150 [IU]/h via INTRAVENOUS
  Filled 2022-08-25: qty 250

## 2022-08-25 MED ORDER — ASPIRIN 81 MG PO CHEW
81.0000 mg | CHEWABLE_TABLET | Freq: Every day | ORAL | Status: DC
  Administered 2022-08-26: 81 mg via ORAL
  Filled 2022-08-25: qty 1

## 2022-08-25 MED ORDER — SODIUM CHLORIDE 0.9 % IV SOLN: 250.0000 mL | INTRAVENOUS | Status: DC | PRN

## 2022-08-25 MED ORDER — SODIUM CHLORIDE 0.9% FLUSH
3.0000 mL | Freq: Two times a day (BID) | INTRAVENOUS | Status: DC
  Administered 2022-08-26: 3 mL via INTRAVENOUS

## 2022-08-25 MED ORDER — NITROGLYCERIN IN D5W 200-5 MCG/ML-% IV SOLN
INTRAVENOUS | Status: AC
  Filled 2022-08-25: qty 250

## 2022-08-25 MED ORDER — IOHEXOL 350 MG/ML SOLN
INTRAVENOUS | Status: DC | PRN
  Administered 2022-08-25: 200 mL

## 2022-08-25 MED ORDER — ACETAMINOPHEN 325 MG PO TABS: 650.0000 mg | ORAL_TABLET | ORAL | Status: DC | PRN

## 2022-08-25 MED ORDER — LIDOCAINE HCL (PF) 1 % IJ SOLN
INTRAMUSCULAR | Status: DC | PRN
  Administered 2022-08-25: 20 mL

## 2022-08-25 MED ORDER — LABETALOL HCL 5 MG/ML IV SOLN
INTRAVENOUS | Status: AC
  Filled 2022-08-25: qty 4

## 2022-08-25 MED ORDER — CLOPIDOGREL BISULFATE 75 MG PO TABS
75.0000 mg | ORAL_TABLET | Freq: Every day | ORAL | Status: DC
  Administered 2022-08-26: 75 mg via ORAL
  Filled 2022-08-25: qty 1

## 2022-08-25 MED ORDER — NITROGLYCERIN IN D5W 200-5 MCG/ML-% IV SOLN
INTRAVENOUS | Status: DC | PRN
  Administered 2022-08-25: 5 ug/min via INTRAVENOUS

## 2022-08-25 MED ORDER — ONDANSETRON HCL 4 MG/2ML IJ SOLN: 4.0000 mg | Freq: Four times a day (QID) | INTRAMUSCULAR | Status: DC | PRN

## 2022-08-25 MED ORDER — HEPARIN (PORCINE) 25000 UT/250ML-% IV SOLN: 1150.0000 [IU]/h | INTRAVENOUS | Status: DC

## 2022-08-25 MED ORDER — HEPARIN (PORCINE) IN NACL 1000-0.9 UT/500ML-% IV SOLN
INTRAVENOUS | Status: DC | PRN
  Administered 2022-08-25 (×2): 500 mL

## 2022-08-25 MED ORDER — HYDRALAZINE HCL 20 MG/ML IJ SOLN
10.0000 mg | INTRAMUSCULAR | Status: AC | PRN
  Filled 2022-08-25: qty 1

## 2022-08-25 MED ORDER — FENTANYL CITRATE (PF) 100 MCG/2ML IJ SOLN
INTRAMUSCULAR | Status: DC | PRN
  Administered 2022-08-25 (×2): 25 ug via INTRAVENOUS

## 2022-08-25 MED ORDER — LIDOCAINE HCL (PF) 1 % IJ SOLN
INTRAMUSCULAR | Status: AC
  Filled 2022-08-25: qty 30

## 2022-08-25 MED ORDER — ATORVASTATIN CALCIUM 80 MG PO TABS
80.0000 mg | ORAL_TABLET | Freq: Every day | ORAL | Status: DC
  Administered 2022-08-25 – 2022-08-26 (×2): 80 mg via ORAL
  Filled 2022-08-25 (×2): qty 1

## 2022-08-25 SURGICAL SUPPLY — 20 items
BALLN SAPPHIRE 2.0X12 (BALLOONS) ×1
BALLOON SAPPHIRE 2.0X12 (BALLOONS) IMPLANT
CATH INFINITI 5 FR JL3.5 (CATHETERS) IMPLANT
CATH INFINITI 5 FR LCB (CATHETERS) IMPLANT
CATH INFINITI 5 FR RCB (CATHETERS) IMPLANT
CATH INFINITI 5FR JL5 (CATHETERS) IMPLANT
CATH INFINITI 5FR MULTPACK ANG (CATHETERS) IMPLANT
CATH VISTA GUIDE 6FR LCB (CATHETERS) IMPLANT
KIT ENCORE 26 ADVANTAGE (KITS) IMPLANT
KIT HEART LEFT (KITS) ×1 IMPLANT
PACK CARDIAC CATHETERIZATION (CUSTOM PROCEDURE TRAY) ×1 IMPLANT
SHEATH PINNACLE 5F 10CM (SHEATH) IMPLANT
SHEATH PINNACLE 6F 10CM (SHEATH) IMPLANT
SHEATH PROBE COVER 6X72 (BAG) IMPLANT
STENT ONYX FRONTIER 2.5X12 (Permanent Stent) IMPLANT
SYR MEDRAD MARK 7 150ML (SYRINGE) IMPLANT
TRANSDUCER W/STOPCOCK (MISCELLANEOUS) ×1 IMPLANT
TUBING CIL FLEX 10 FLL-RA (TUBING) ×1 IMPLANT
WIRE ASAHI PROWATER 180CM (WIRE) IMPLANT
WIRE EMERALD 3MM-J .035X150CM (WIRE) IMPLANT

## 2022-08-25 NOTE — Progress Notes (Signed)
Progress Note  Patient Name: Rachael Becker Date of Encounter: 08/25/2022  Primary Cardiologist:   [name], MD   Subjective   The patient has had no further chest pain.  She has no SOB.   Inpatient Medications    Scheduled Meds:  amLODipine  5 mg Oral Daily   buPROPion ER  200 mg Oral Daily   carvedilol  25 mg Oral BID   clopidogrel  75 mg Oral Q breakfast   fluticasone furoate-vilanterol  1 puff Inhalation Daily   heparin  5,000 Units Subcutaneous Q8H   hydrochlorothiazide  25 mg Oral Daily   insulin aspart  0-15 Units Subcutaneous TID WC   insulin aspart  0-5 Units Subcutaneous QHS   losartan  100 mg Oral Daily   methocarbamol  500 mg Oral BID   nicotine  14 mg Transdermal Daily   rosuvastatin  10 mg Oral Daily   sodium chloride flush  3 mL Intravenous Q12H   Continuous Infusions:  sodium chloride     sodium chloride 1 mL/kg/hr (08/25/22 0534)   PRN Meds: sodium chloride, acetaminophen, albuterol, ALPRAZolam, labetalol, nitroGLYCERIN, ondansetron (ZOFRAN) IV, oxyCODONE-acetaminophen **AND** oxyCODONE, sodium chloride flush   Vital Signs    Vitals:   08/24/22 1632 08/24/22 2110 08/25/22 0434 08/25/22 0746  BP: 136/76 (!) 145/85 (!) 157/86 (!) 157/96  Pulse: 79 79 78 70  Resp:  18 18 18  $ Temp:  98.3 F (36.8 C) 97.8 F (36.6 C) 98 F (36.7 C)  TempSrc:  Oral Oral Oral  SpO2:  95% 95% 96%  Weight: 83.6 kg  82.6 kg   Height:        Intake/Output Summary (Last 24 hours) at 08/25/2022 0825 Last data filed at 08/25/2022 0700 Gross per 24 hour  Intake 949.82 ml  Output 400 ml  Net 549.82 ml   Filed Weights   08/24/22 0034 08/24/22 1632 08/25/22 0434  Weight: 82.9 kg 83.6 kg 82.6 kg    Telemetry    NA - Personally Reviewed  ECG    NA - Personally Reviewed  Physical Exam   GEN: No acute distress.   Neck: No  JVD Cardiac: RRR, no murmurs, rubs, or gallops.  Respiratory: Clear  to auscultation bilaterally. GI: Soft, nontender,  non-distended  MS: No  edema; No deformity.  Right femoral access site intact without hematoma or bleeding Neuro:  Nonfocal  Psych: Normal affect   Labs    Chemistry Recent Labs  Lab 08/22/22 2125 08/23/22 0041  NA 144 139  K 4.6 4.0  CL 108 108  CO2 26 22  GLUCOSE 123* 126*  BUN 19 18  CREATININE 0.91 0.79  CALCIUM 9.5 8.8*  PROT 6.3*  --   ALBUMIN 3.4*  --   AST 16  --   ALT 12  --   ALKPHOS 68  --   BILITOT 0.3  --   GFRNONAA >60 >60  ANIONGAP 10 9     Hematology Recent Labs  Lab 08/22/22 2125  WBC 8.5  RBC 5.19*  HGB 13.2  HCT 43.1  MCV 83.0  MCH 25.4*  MCHC 30.6  RDW 17.9*  PLT 226    Cardiac EnzymesNo results for input(s): "TROPONINI" in the last 168 hours. No results for input(s): "TROPIPOC" in the last 168 hours.   BNP Recent Labs  Lab 08/22/22 2125  BNP 213.4*     DDimer No results for input(s): "DDIMER" in the last 168 hours.   Radiology  ECHOCARDIOGRAM COMPLETE  Result Date: 08/23/2022    ECHOCARDIOGRAM REPORT   Patient Name:   Rachael Becker Date of Exam: 08/23/2022 Medical Rec #:  [id number]        Height:       65.0 in Accession #:    [id number]       Weight:       186.5 lb Date of Birth:  1965/08/26         BSA:          1.920 m Patient Age:    57 years         BP:           131/76 mmHg Patient Gender: F                HR:           60 bpm. Exam Location:  Inpatient Procedure: 2D Echo, Cardiac Doppler, Color Doppler and Strain Analysis Indications:    Chest Pain R07.9  History:        Patient has no prior history of Echocardiogram examinations.                 PAD, Signs/Symptoms:Fatigue; Risk Factors:Hypertension.  Sonographer:    Luane School RDCS Referring Phys: [id number] [place]  1. Abnormal global strain and inferior basal hypokinesis . Left ventricular ejection fraction, by estimation, is 55%. The left ventricle has normal function. The left ventricle demonstrates regional wall motion abnormalities (see scoring  diagram/findings for description). There is moderate left ventricular hypertrophy. Left ventricular diastolic parameters are consistent with Grade II diastolic dysfunction (pseudonormalization). Elevated left ventricular end-diastolic pressure. The average left ventricular global longitudinal strain is -13.8 %. The global longitudinal strain is abnormal.  2. Right ventricular systolic function is normal. The right ventricular size is normal. There is normal pulmonary artery systolic pressure.  3. Left atrial size was mildly dilated.  4. The mitral valve is abnormal. Trivial mitral valve regurgitation. No evidence of mitral stenosis.  5. The aortic valve is tricuspid. There is mild calcification of the aortic valve. There is mild thickening of the aortic valve. Aortic valve regurgitation is not visualized. Aortic valve sclerosis is present, with no evidence of aortic valve stenosis.  6. The inferior vena cava is normal in size with greater than 50% respiratory variability, suggesting right atrial pressure of 3 mmHg. FINDINGS  Left Ventricle: Abnormal global strain and inferior basal hypokinesis. Left ventricular ejection fraction, by estimation, is 55%. The left ventricle has normal function. The left ventricle demonstrates regional wall motion abnormalities. The average left ventricular global longitudinal strain is -13.8 %. The global longitudinal strain is abnormal. The left ventricular internal cavity size was normal in size. There is moderate left ventricular hypertrophy. Left ventricular diastolic parameters are consistent with Grade II diastolic dysfunction (pseudonormalization). Elevated left ventricular end-diastolic pressure. Right Ventricle: The right ventricular size is normal. No increase in right ventricular wall thickness. Right ventricular systolic function is normal. There is normal pulmonary artery systolic pressure. The tricuspid regurgitant velocity is 1.72 m/s, and  with an assumed right atrial  pressure of 3 mmHg, the estimated right ventricular systolic pressure is [id number] mmHg. Left Atrium: Left atrial size was mildly dilated. Right Atrium: Right atrial size was normal in size. Pericardium: There is no evidence of pericardial effusion. Mitral Valve: The mitral valve is abnormal. There is mild thickening of the mitral valve leaflet(s). There is mild calcification of the mitral valve leaflet(s). Mild mitral annular calcification.  Trivial mitral valve regurgitation. No evidence of mitral valve stenosis. Tricuspid Valve: The tricuspid valve is normal in structure. Tricuspid valve regurgitation is mild . No evidence of tricuspid stenosis. Aortic Valve: The aortic valve is tricuspid. There is mild calcification of the aortic valve. There is mild thickening of the aortic valve. Aortic valve regurgitation is not visualized. Aortic valve sclerosis is present, with no evidence of aortic valve stenosis. Pulmonic Valve: The pulmonic valve was normal in structure. Pulmonic valve regurgitation is not visualized. No evidence of pulmonic stenosis. Aorta: The aortic root is normal in size and structure. Venous: The inferior vena cava is normal in size with greater than 50% respiratory variability, suggesting right atrial pressure of 3 mmHg. IAS/Shunts: The interatrial septum was not well visualized.  LEFT VENTRICLE PLAX 2D LVIDd:         4.60 cm   Diastology LVIDs:         3.10 cm   LV e' medial:    5.98 cm/s LV PW:         1.50 cm   LV E/e' medial:  17.6 LV IVS:        1.50 cm   LV e' lateral:   6.64 cm/s LVOT diam:     2.00 cm   LV E/e' lateral: 15.8 LV SV:         63 LV SV Index:   33        2D Longitudinal Strain LVOT Area:     3.14 cm  2D Strain GLS Avg:     -13.8 %  RIGHT VENTRICLE             IVC RV S prime:     11.00 cm/s  IVC diam: 1.90 cm TAPSE (M-mode): 1.7 cm LEFT ATRIUM             Index        RIGHT ATRIUM           Index LA diam:        3.90 cm 2.03 cm/m   RA Area:     17.70 cm LA Vol (A2C):   77.2 ml  40.20 ml/m  RA Volume:   49.70 ml  25.88 ml/m LA Vol (A4C):   54.5 ml 28.38 ml/m LA Biplane Vol: 64.5 ml 33.59 ml/m  AORTIC VALVE LVOT Vmax:   101.00 cm/s LVOT Vmean:  58.700 cm/s LVOT VTI:    0.202 m  AORTA Ao Root diam: 2.90 cm Ao Asc diam:  3.30 cm Ao Desc diam: 2.50 cm MITRAL VALVE                TRICUSPID VALVE MV Area (PHT): 3.65 cm     TR Peak grad:   11.8 mmHg MV Decel Time: 208 msec     TR Vmax:        172.00 cm/s MV E velocity: 105.00 cm/s MV A velocity: 79.70 cm/s   SHUNTS MV E/A ratio:  1.32         Systemic VTI:  0.20 m                             Systemic Diam: 2.00 cm Jenkins Rouge MD Electronically signed by Jenkins Rouge MD Signature Date/Time: 08/23/2022/2:55:25 PM    Final     Cardiac Studies   Echo 08/23/21: IMPRESSIONS     1. Abnormal global strain and inferior basal hypokinesis . Left  ventricular ejection  fraction, by estimation, is 55%. The left ventricle  has normal function. The left ventricle demonstrates regional wall motion  abnormalities (see scoring  diagram/findings for description). There is moderate left ventricular  hypertrophy. Left ventricular diastolic parameters are consistent with  Grade II diastolic dysfunction (pseudonormalization). Elevated left  ventricular end-diastolic pressure. The  average left ventricular global longitudinal strain is -13.8 %. The global  longitudinal strain is abnormal.   2. Right ventricular systolic function is normal. The right ventricular  size is normal. There is normal pulmonary artery systolic pressure.   3. Left atrial size was mildly dilated.   4. The mitral valve is abnormal. Trivial mitral valve regurgitation. No  evidence of mitral stenosis.   5. The aortic valve is tricuspid. There is mild calcification of the  aortic valve. There is mild thickening of the aortic valve. Aortic valve  regurgitation is not visualized. Aortic valve sclerosis is present, with  no evidence of aortic valve stenosis.   6. The  inferior vena cava is normal in size with greater than 50%  respiratory variability, suggesting right atrial pressure of 3 mmHg.    Diagnostic Dominance: Right  Intervention       Patient Profile     57 y.o. female  with CAD s/p CABG, PAF, PVD, bipolar disorder, depression, HTN, HL, DM and tobacco abuse admitted with CP.      Assessment & Plan    Unstable angina/CAD status post CABG:   Dr. Gwenlyn Found performed a cath today via  femoral approach. She had high-grade circumflex SVG OM insertion stenosis which was stented. Her LIMA to her LAD is patent. Her right is occluded and not grafted. The remainder of her vein grafts are occluded. She became hypertensive in the lab and was placed on IV nitro drip. She will need "triple therapy" for 30 days after which aspirin can be discontinued. Probably home tomorrow.   Hyperlipidemia:  Continue statin.   PAF:   Resume anticoagulation as above.  Will start Eliquis in the AM.   Tobacco abuse:   On Wellbutrin and nicotine patch.    Hypertension: BP was very labile and required IV NTG.  I am going to hold the PM beta blocker today.     PAD:  Status post R SFA angioplasty/PCI.    For questions or updates, please contact [place] Please consult [email or web address] for contact info under Cardiology/STEMI.   Signed, Minus Breeding, MD  08/25/2022, 8:25 AM

## 2022-08-25 NOTE — Progress Notes (Addendum)
ANTICOAGULATION CONSULT NOTE - Initial Consult  Pharmacy Consult for Heparin Indication: atrial fibrillation  Allergies  Allergen Reactions   Ace Inhibitors Cough   Naprosyn [Naproxen] Nausea And Vomiting    Patient Measurements: Height: 5' 5"$  (165.1 cm) Weight: 82.6 kg (182 lb 3.2 oz) IBW/kg (Calculated) : 57 Heparin Dosing Weight: 75 kg  Vital Signs: Temp: 98 F (36.7 C) (02/19 0746) Temp Source: Oral (02/19 0746) BP: 168/110 (02/19 1550) Pulse Rate: 68 (02/19 1550)  Labs: Recent Labs    08/22/22 2109 08/22/22 2125 08/22/22 2304 08/23/22 0041  HGB  --  13.2  --   --   HCT  --  43.1  --   --   PLT  --  226  --   --   CREATININE  --  0.91  --  0.79  TROPONINIHS 8  --  8  --     Estimated Creatinine Clearance: 81.3 mL/min (by C-G formula based on SCr of 0.79 mg/dL).   Medical History: Past Medical History:  Diagnosis Date   Bipolar disorder ([facility])    Depression    Diabetes mellitus without complication ([facility])    Dysrhythmia    a fib   Heart disease    History of chicken pox    Hyperlipidemia    Hypertension     Medications:  Medications Prior to Admission  Medication Sig Dispense Refill Last Dose   albuterol (VENTOLIN HFA) 108 (90 Base) MCG/ACT inhaler Inhale 2 puffs into the lungs every 4 (four) hours as needed (COPD).   08/21/2022   ALPRAZolam (XANAX) 1 MG tablet Take 1 tablet (1 mg total) by mouth 2 (two) times daily as needed for anxiety. (Patient taking differently: Take 1 mg by mouth 3 (three) times daily as needed for anxiety.) 60 tablet 0 08/21/2022   BREO ELLIPTA 100-25 MCG/ACT AEPB Inhale 1 puff into the lungs daily.   08/21/2022   buPROPion ER (WELLBUTRIN SR) 100 MG 12 hr tablet Take 200 mg by mouth daily.   08/22/2022   carvedilol (COREG) 25 MG tablet Take 25 mg by mouth 2 (two) times daily.  0 08/22/2022 at 0700   ELIQUIS 5 MG TABS tablet Take 1 tablet (5 mg total) by mouth 2 (two) times daily. 60 tablet 2 08/21/2022   hydrOXYzine (ATARAX) 25 MG  tablet Take 25 mg by mouth 3 (three) times daily as needed for anxiety.   Past Week   irbesartan (AVAPRO) 150 MG tablet Take 150 mg by mouth daily.   Past Week   methocarbamol (ROBAXIN) 500 MG tablet Take 1 tablet (500 mg total) by mouth 2 (two) times daily. (Patient taking differently: Take 750 mg by mouth 4 (four) times daily as needed for muscle spasms.) 20 tablet 0 08/20/2022   montelukast (SINGULAIR) 10 MG tablet Take 10 mg by mouth at bedtime.   Past Week   oxyCODONE-acetaminophen (PERCOCET) 10-325 MG tablet Take 1 tablet by mouth in the morning, at noon, in the evening, and at bedtime.   [id number]   REPATHA SURECLICK [id number] MG/ML SOAJ Inject 140 mg into the skin every 14 (fourteen) days.   08/11/2022   venlafaxine XR (EFFEXOR-XR) 150 MG 24 hr capsule Take 300 mg by mouth daily with breakfast.   08/21/2022   amLODipine (NORVASC) 10 MG tablet Take 10 mg by mouth daily. (Patient not taking: Reported on 08/23/2022)   Not Taking   clopidogrel (PLAVIX) 75 MG tablet Take 1 tablet (75 mg total) by mouth daily with breakfast. 90  tablet 3 unk   hydrochlorothiazide (HYDRODIURIL) 25 MG tablet Take 1 tablet (25 mg total) by mouth daily. (Patient not taking: Reported on 08/23/2022) 90 tablet 1 Not Taking   losartan (COZAAR) 100 MG tablet Take 100 mg by mouth daily. (Patient not taking: Reported on 08/23/2022)  1 Not Taking   QUEtiapine (SEROQUEL) 50 MG tablet Take 50 mg by mouth at bedtime. (Patient not taking: Reported on 08/23/2022)  1 Not Taking   rosuvastatin (CRESTOR) 5 MG tablet Take 1 tablet (5 mg total) by mouth daily. (Patient not taking: Reported on 08/23/2022) 30 tablet 11 Not Taking   venlafaxine XR (EFFEXOR-XR) 75 MG 24 hr capsule Take 1 capsule (75 mg total) by mouth daily with breakfast. (Patient not taking: Reported on 08/23/2022) 30 capsule 3 Not Taking    Assessment: 57 y.o. F presents with unstable angina. Pt on Eliquis PTA (last dose 2/15) - held for cardiac cath. S/p cath 2/19 and plan to resume  heparin 6 hours post sheath removal then transition back to Eliquis tomorrow.  Sheath removed 1525  Goal of Therapy:  Heparin level 0.3-0.7 units/ml aPTT 66-102 seconds Monitor platelets by anticoagulation protocol: Yes   Plan:  At 2115, start heparin gtt at 1150 units/hr. No bolus. Will f/u heparin level and aPTT 8 hr post gtt start F/u transition to Eliquis tomorrow a.m.  Sherlon Handing, PharmD, BCPS Please see amion for complete clinical pharmacist phone list 08/25/2022,5:22 PM

## 2022-08-25 NOTE — Progress Notes (Signed)
Site area- right  Site Prior to Removal- 0   Pressure Applied For-  20  MInutes   Bedrest Beginning at - 1315    Manual- Yes   Patient Status During Pull- Stable    Post Pull Groin Site- 0   Post Pull Instructions Given- Yes   Post Pull Pulses Present- Yes    Dressing Applied- Tegaderm and Gauze Dressing    Comments:  Pt educated on site care. verbalizes understanding

## 2022-08-25 NOTE — Interval H&P Note (Signed)
Cath Lab Visit (complete for each Cath Lab visit)  Clinical Evaluation Leading to the Procedure:   ACS: Yes.    Non-ACS:    Anginal Classification: CCS II  Anti-ischemic medical therapy: Maximal Therapy (2 or more classes of medications)  Non-Invasive Test Results: No non-invasive testing performed  Prior CABG: Previous CABG      History and Physical Interval Note:  08/25/2022 8:52 AM  Rachael Becker  has presented today for surgery, with the diagnosis of unstable angina.  The various methods of treatment have been discussed with the patient and family. After consideration of risks, benefits and other options for treatment, the patient has consented to  Procedure(s): LEFT HEART CATH AND CORS/GRAFTS ANGIOGRAPHY (N/A) as a surgical intervention.  The patient's history has been reviewed, patient examined, no change in status, stable for surgery.  I have reviewed the patient's chart and labs.  Questions were answered to the patient's satisfaction.     Rachael Becker

## 2022-08-25 NOTE — Progress Notes (Signed)
Nurse called reporting patient had elevated BP [id number] systolic. Patient assessed at bedside, poor knowledge on her medication, no symptoms or complaints. Upon review records, she did not take her antihypertensive this AM before cath. She required nitro gtt for HTN urgency during cath, post cath BP dropped to 78/42 with nitro gtt + hydralazine bolus and this has been stopped. During encounter, her BP is up to 216/93. Advised RN to administer IV hydralazine now, she has both PRN hydralazine and labetalol already for HTN urgency. Her AM HTN meds are being sent from pharmacy per staff RN reports. Please call if more questions.

## 2022-08-25 NOTE — Plan of Care (Signed)
  Problem: Nutritional: Goal: Progress toward achieving an optimal weight will improve Outcome: Completed/Met   Problem: Skin Integrity: Goal: Risk for impaired skin integrity will decrease Outcome: Completed/Met   

## 2022-08-25 NOTE — Progress Notes (Signed)
Margie Billet, NP was called and rounded on patient in the cath holding area.39m of IV labetolol was administered. Patient's blood pressure had continued to rise and all the blood pressure related medications were held prior to the procedure. Medications were requested by pharmacy and patient was given the following medications by mouth - norvasc, coreg, losartan, and hydrochlorothiazide.  Patient does still have right femoral sheath that will need to be pulled once blood pressure and ACT is acceptable.

## 2022-08-26 ENCOUNTER — Inpatient Hospital Stay (HOSPITAL_COMMUNITY): Payer: Medicare HMO

## 2022-08-26 ENCOUNTER — Encounter (HOSPITAL_COMMUNITY): Payer: Self-pay | Admitting: Cardiovascular Disease

## 2022-08-26 ENCOUNTER — Other Ambulatory Visit (HOSPITAL_COMMUNITY): Payer: Self-pay

## 2022-08-26 DIAGNOSIS — Z720 Tobacco use: Secondary | ICD-10-CM | POA: Insufficient documentation

## 2022-08-26 DIAGNOSIS — I480 Paroxysmal atrial fibrillation: Secondary | ICD-10-CM | POA: Insufficient documentation

## 2022-08-26 DIAGNOSIS — I200 Unstable angina: Secondary | ICD-10-CM | POA: Diagnosis not present

## 2022-08-26 LAB — HEPARIN LEVEL (UNFRACTIONATED): Heparin Unfractionated: 0.46 IU/mL (ref 0.30–0.70)

## 2022-08-26 LAB — CBC
HCT: 47.8 % — ABNORMAL HIGH (ref 36.0–46.0)
Hemoglobin: 14.6 g/dL (ref 12.0–15.0)
MCH: 25.5 pg — ABNORMAL LOW (ref 26.0–34.0)
MCHC: 30.5 g/dL (ref 30.0–36.0)
MCV: 83.6 fL (ref 80.0–100.0)
Platelets: 219 10*3/uL (ref 150–400)
RBC: 5.72 MIL/uL — ABNORMAL HIGH (ref 3.87–5.11)
RDW: 18.4 % — ABNORMAL HIGH (ref 11.5–15.5)
WBC: 9.1 10*3/uL (ref 4.0–10.5)
nRBC: 0 % (ref 0.0–0.2)

## 2022-08-26 LAB — GLUCOSE, CAPILLARY
Glucose-Capillary: 163 mg/dL — ABNORMAL HIGH (ref 70–99)
Glucose-Capillary: 118 mg/dL — ABNORMAL HIGH (ref 70–99)

## 2022-08-26 LAB — BASIC METABOLIC PANEL
Anion gap: 10 (ref 5–15)
BUN: 17 mg/dL (ref 6–20)
CO2: 27 mmol/L (ref 22–32)
Calcium: 9.8 mg/dL (ref 8.9–10.3)
Chloride: 102 mmol/L (ref 98–111)
Creatinine, Ser: 0.96 mg/dL (ref 0.44–1.00)
GFR, Estimated: >60 mL/min (ref >60)
Glucose, Bld: 121 mg/dL — ABNORMAL HIGH (ref 70–99)
Potassium: 4.4 mmol/L (ref 3.5–5.1)
Sodium: 139 mmol/L (ref 135–145)

## 2022-08-26 LAB — APTT: aPTT: 54 seconds — ABNORMAL HIGH (ref 24–36)

## 2022-08-26 MED ORDER — ASPIRIN 81 MG PO TBEC
81.0000 mg | DELAYED_RELEASE_TABLET | Freq: Every day | ORAL | 0 refills | Status: AC
  Filled 2022-08-26: qty 30, 30d supply, fill #0

## 2022-08-26 MED ORDER — NITROGLYCERIN 0.4 MG SL SUBL
0.4000 mg | SUBLINGUAL_TABLET | SUBLINGUAL | 1 refills | Status: AC | PRN
  Filled 2022-08-26: qty 25, 7d supply, fill #0

## 2022-08-26 MED ORDER — NICOTINE 14 MG/24HR TD PT24
14.0000 mg | MEDICATED_PATCH | Freq: Every day | TRANSDERMAL | 0 refills | Status: AC
  Filled 2022-08-26: qty 28, 28d supply, fill #0

## 2022-08-26 MED ORDER — ATORVASTATIN CALCIUM 80 MG PO TABS
80.0000 mg | ORAL_TABLET | Freq: Every day | ORAL | 3 refills | Status: AC
  Filled 2022-08-26: qty 90, 90d supply, fill #0

## 2022-08-26 MED ORDER — APIXABAN 5 MG PO TABS
5.0000 mg | ORAL_TABLET | Freq: Two times a day (BID) | ORAL | Status: DC
  Administered 2022-08-26: 5 mg via ORAL
  Filled 2022-08-26: qty 1

## 2022-08-26 MED ORDER — AMLODIPINE BESYLATE 5 MG PO TABS
5.0000 mg | ORAL_TABLET | Freq: Every day | ORAL | 3 refills | Status: AC
  Filled 2022-08-26: qty 90, 90d supply, fill #0

## 2022-08-26 MED ORDER — CARVEDILOL 25 MG PO TABS
25.0000 mg | ORAL_TABLET | Freq: Two times a day (BID) | ORAL | Status: DC
  Administered 2022-08-26: 25 mg via ORAL
  Filled 2022-08-26: qty 1

## 2022-08-26 MED FILL — Nitroglycerin IV Soln 100 MCG/ML in D5W: INTRA_ARTERIAL | Qty: 10 | Status: AC

## 2022-08-26 MED FILL — Ondansetron HCl Inj 4 MG/2ML (2 MG/ML): INTRAMUSCULAR | Qty: 2 | Status: AC

## 2022-08-26 NOTE — Care Management Important Message (Signed)
Important Message  Patient Details  Name: Rachael Becker MRN: [id number] Date of Birth: July 03, 1966   Medicare Important Message Given:  Yes     Shelda Altes 08/26/2022, 12:40 PM

## 2022-08-26 NOTE — Progress Notes (Signed)
ANTICOAGULATION CONSULT NOTE - Follow Up Consult  Pharmacy Consult for heparin Indication: atrial fibrillation  Labs: Recent Labs    08/26/22 0455  HGB 14.6  HCT 47.8*  PLT 219  APTT 54*  HEPARINUNFRC 0.46    Assessment: 56yo female subtherapeutic on heparin with initial dosing s/p cardiac cath; no infusion issues or signs of bleeding per RN.  Goal of Therapy:  aPTT 66-102 seconds   Plan:  Will increase heparin infusion by 2 units/kg/hr to 1300 units/hr and check level in 6 hours.    Wynona Neat, PharmD, BCPS  08/26/2022,5:33 AM

## 2022-08-26 NOTE — Progress Notes (Addendum)
Rounding Note    Patient Name: Rachael Becker Date of Encounter: 08/26/2022  [place] Cardiologist: [name], MD   Subjective   Patient is doing well this AM. No chest pain, palpitations, shortness of breath. Femoral cath site is stable. No dizziness, syncope, near syncope.   Inpatient Medications    Scheduled Meds:  amLODipine  5 mg Oral Daily   aspirin  81 mg Oral Daily   atorvastatin  80 mg Oral Daily   buPROPion ER  200 mg Oral Daily   clopidogrel  75 mg Oral Q breakfast   fluticasone furoate-vilanterol  1 puff Inhalation Daily   hydrochlorothiazide  25 mg Oral Daily   insulin aspart  0-15 Units Subcutaneous TID WC   insulin aspart  0-5 Units Subcutaneous QHS   losartan  100 mg Oral Daily   methocarbamol  500 mg Oral BID   nicotine  14 mg Transdermal Daily   sodium chloride flush  3 mL Intravenous Q12H   Continuous Infusions:  sodium chloride     heparin 1,300 Units/hr (08/26/22 0540)   PRN Meds: sodium chloride, acetaminophen, acetaminophen, albuterol, ALPRAZolam, labetalol, morphine injection, nitroGLYCERIN, ondansetron (ZOFRAN) IV, ondansetron (ZOFRAN) IV, oxyCODONE-acetaminophen **AND** oxyCODONE, sodium chloride flush   Vital Signs    Vitals:   08/25/22 2146 08/25/22 2333 08/26/22 0424 08/26/22 0802  BP: 113/85 115/61 135/81 (!) 139/105  Pulse:  69 77 85  Resp:    17  Temp:  98.4 F (36.9 C) 97.8 F (36.6 C) 97.7 F (36.5 C)  TempSrc:  Oral Oral Oral  SpO2:      Weight:      Height:        Intake/Output Summary (Last 24 hours) at 08/26/2022 0848 Last data filed at 08/26/2022 0600 Gross per 24 hour  Intake 569.09 ml  Output --  Net 569.09 ml      08/25/2022    4:34 AM 08/24/2022    4:32 PM 08/24/2022   12:34 AM  Last 3 Weights  Weight (lbs) 182 lb 3.2 oz 184 lb 3.2 oz 182 lb 12.8 oz  Weight (kg) 82.645 kg 83.553 kg 82.918 kg      Telemetry    NSR, HR in the 80s - Personally Reviewed  ECG     Sinus rhythm, TWI in  leads I, aVL, V5, V6 - Personally Reviewed  Physical Exam   GEN: No acute distress.  Sitting comfortably in the bed  Neck: No JVD Cardiac: RRR, grade 2/6 systolic murmur at RUSB. Right Femoral cath site is soft, nontender. No bruising or bleeding noted  Respiratory: Clear to auscultation bilaterally. Normal WOB on room air  GI: Soft, nontender, non-distended  MS: No edema in BLE; No deformity. Neuro:  Nonfocal  Psych: Normal affect   Labs    High Sensitivity Troponin:   Recent Labs  Lab 08/22/22 2109 08/22/22 2304  TROPONINIHS 8 8     Chemistry Recent Labs  Lab 08/22/22 2125 08/23/22 0041 08/26/22 0455  NA 144 139 139  K 4.6 4.0 4.4  CL 108 108 102  CO2 26 22 27  $ GLUCOSE 123* 126* 121*  BUN 19 18 17  $ CREATININE 0.91 0.79 0.96  CALCIUM 9.5 8.8* 9.8  PROT 6.3*  --   --   ALBUMIN 3.4*  --   --   AST 16  --   --   ALT 12  --   --   ALKPHOS 68  --   --  BILITOT 0.3  --   --   GFRNONAA >60 >60 >60  ANIONGAP 10 9 10    $ Lipids  Recent Labs  Lab 08/23/22 0041  CHOL 179  TRIG 115  HDL 47  LDLCALC 109*  CHOLHDL 3.8    Hematology Recent Labs  Lab 08/22/22 2125 08/26/22 0455  WBC 8.5 9.1  RBC 5.19* 5.72*  HGB 13.2 14.6  HCT 43.1 47.8*  MCV 83.0 83.6  MCH 25.4* 25.5*  MCHC 30.6 30.5  RDW 17.9* 18.4*  PLT 226 219   Thyroid  Recent Labs  Lab 08/23/22 0041  TSH 4.199    BNP Recent Labs  Lab 08/22/22 2125  BNP 213.4*    DDimer No results for input(s): "DDIMER" in the last 168 hours.   Radiology    CARDIAC CATHETERIZATION  Result Date: 08/25/2022 Images from the original result were not included.   Prox RCA to Mid RCA lesion is 100% stenosed.   Ost LM lesion is 99% stenosed.   Prox LAD to Mid LAD lesion is 100% stenosed.   Origin to Prox Graft lesion is 100% stenosed.   Prox Graft lesion is 100% stenosed.   Dist Cx lesion is 99% stenosed.   A drug-eluting stent was successfully placed using a STENT ONYX FRONTIER 2.5X12.   Post intervention, there  is a 0% residual stenosis. Rachael Becker is a 57 y.o. female  [id number] LOCATION:  FACILITY: [facility] PHYSICIAN: [name], M.D. [birth year] DATE OF PROCEDURE:  08/25/2022 DATE OF DISCHARGE: CARDIAC CATHETERIZATION / PCI DES LCX OM (AFTER SVG) History obtained from chart review.57 y.o. female  with CAD s/p CABG (2016 in [place]), PAF, PVD (status post right SFA intervention by myself May 2023)., bipolar disorder, depression, HTN, HL, DM and tobacco abuse admitted with unstable angina her troponins were negative.  Her Eliquis was held.  She presents now for diagnostic coronary angiography.   PROCEDURE DESCRIPTION: The patient was brought to the second floor  Cardiac cath lab in the postabsorptive state.  She was premedicated with IV Versed and fentanyl.  Her right groin was prepped and shaved in usual sterile fashion. Xylocaine 1% was used for local anesthesia. A 5 French sheath was inserted into the right common femoral artery using standard Seldinger technique.  Ultrasound was used to identify the right common femoral artery and guide access.  A digital image was captured and placed the patient's chart.  5 French right left Judkins diagnostic catheters over the 5 French pigtail catheter were used for selective coronary angiography, selective vein graft and IMA graft angiography, obtain left heart pressures and supravalvular aortography.  Isovue dye was used for the entirety of the case (200 cc of contrast total to patient).  Retrograde aortic, ventricular and pullback pressures were recorded. The 5 French sheath in the right common femoral artery was exchanged over an 035 J-wire for a 6 [place] sheath.  The patient received 60 mg of p.o. clopidogrel in addition to 12,000's of heparin with an ACT measured at 309 at the end of the case.  Isovue dye is used for the entirety of the intervention.  Retrograde aortic pressures monitored to the case. Using a 6 French LCB guide catheter along the 0.14 [place]  guidewire and a 2 mm x 12 mm balloon the distal circumflex marginal vein graft anastomosis was crossed without difficulty.  The lesion just beyond the anastomosis was then dilated and stented with a 2.5 mm x 16 mm long Medtronic frontier Onyx drug-eluting  stent up to 16 atm (2.66 mm) resulting reduction of a 95 to 99% stenosis just beyond graft insertion to 0% residual.  Patient had reproducible pain with balloon inflation similar to her pain prior to admission.  She did become significantly hypertensive and was treated with 25 mcg of fentanyl, 10 mg of hydralazine and a nitroglycerin drip which resulted in significant improvement in her blood pressure.   Ms. Nerby has 2 patent grafts of the 5 that were placed 8 years ago in [facility].  The patient did not have graft markers so it was difficult to identify the grafts although a supravalvular aortogram only showed 1 patent vein graft.  The LIMA was widely patent and gave left-to-right collaterals from the LAD to the occluded RCA.  She had an excellent result from PCI drug-eluting stenting of the obtuse marginal branch vein graft beyond insertion which I suspect is responsible for her accelerated symptoms.  Given the fact that she was on Eliquis she will need to be on "triple therapy" for 1 month after which aspirin can be discontinued.  I am going to restart heparin 6 hours after sheath removal without a bolus and probably begin her on Eliquis tomorrow.  The sheath was sewn securely in place.  The patient left lab in stable condition. [name]. MD, [facility] 08/25/2022 10:37 AM    PERIPHERAL VASCULAR CATHETERIZATION  Result Date: 08/25/2022 Images from the original result were not included.   Prox RCA to Mid RCA lesion is 100% stenosed.   Ost LM lesion is 99% stenosed.   Prox LAD to Mid LAD lesion is 100% stenosed.   Origin to Prox Graft lesion is 100% stenosed.   Prox Graft lesion is 100% stenosed.   Dist Cx lesion is 99% stenosed.   A drug-eluting stent was  successfully placed using a STENT ONYX FRONTIER 2.5X12.   Post intervention, there is a 0% residual stenosis. EMELLIE THAYNE is a 57 y.o. female  [id number] LOCATION:  FACILITY: [place] PHYSICIAN: [name], M.D. 1966-06-04 DATE OF PROCEDURE:  08/25/2022 DATE OF DISCHARGE: CARDIAC CATHETERIZATION / PCI DES LCX OM (AFTER SVG) History obtained from chart review.57 y.o. female  with CAD s/p CABG (2016 in [place]), PAF, PVD (status post right SFA intervention by myself May 2023)., bipolar disorder, depression, HTN, HL, DM and tobacco abuse admitted with unstable angina her troponins were negative.  Her Eliquis was held.  She presents now for diagnostic coronary angiography.   PROCEDURE DESCRIPTION: The patient was brought to the second floor [place] Cardiac cath lab in the postabsorptive state.  She was premedicated with IV Versed and fentanyl.  Her right groin was prepped and shaved in usual sterile fashion. Xylocaine 1% was used for local anesthesia. A 5 French sheath was inserted into the right common femoral artery using standard Seldinger technique.  Ultrasound was used to identify the right common femoral artery and guide access.  A digital image was captured and placed the patient's chart.  5 French right left Judkins diagnostic catheters over the 5 French pigtail catheter were used for selective coronary angiography, selective vein graft and IMA graft angiography, obtain left heart pressures and supravalvular aortography.  Isovue dye was used for the entirety of the case (200 cc of contrast total to patient).  Retrograde aortic, ventricular and pullback pressures were recorded. The 5 French sheath in the right common femoral artery was exchanged over an 035 J-wire for a 6 [place] sheath.  The patient received 60 mg  of p.o. clopidogrel in addition to 12,000's of heparin with an ACT measured at 309 at the end of the case.  Isovue dye is used for the entirety of the intervention.  Retrograde aortic pressures  monitored to the case. Using a 6 French LCB guide catheter along the 0.14 [place] guidewire and a 2 mm x 12 mm balloon the distal circumflex marginal vein graft anastomosis was crossed without difficulty.  The lesion just beyond the anastomosis was then dilated and stented with a 2.5 mm x 16 mm long Medtronic frontier Onyx drug-eluting stent up to 16 atm (2.66 mm) resulting reduction of a 95 to 99% stenosis just beyond graft insertion to 0% residual.  Patient had reproducible pain with balloon inflation similar to her pain prior to admission.  She did become significantly hypertensive and was treated with 25 mcg of fentanyl, 10 mg of hydralazine and a nitroglycerin drip which resulted in significant improvement in her blood pressure.   Ms. Ludd has 2 patent grafts of the 5 that were placed 8 years ago in [facility].  The patient did not have graft markers so it was difficult to identify the grafts although a supravalvular aortogram only showed 1 patent vein graft.  The LIMA was widely patent and gave left-to-right collaterals from the LAD to the occluded RCA.  She had an excellent result from PCI drug-eluting stenting of the obtuse marginal branch vein graft beyond insertion which I suspect is responsible for her accelerated symptoms.  Given the fact that she was on Eliquis she will need to be on "triple therapy" for 1 month after which aspirin can be discontinued.  I am going to restart heparin 6 hours after sheath removal without a bolus and probably begin her on Eliquis tomorrow.  The sheath was sewn securely in place.  The patient left lab in stable condition. [name]. MD, [facility] 08/25/2022 10:37 AM     Cardiac Studies   Echocardiogram 08/23/22  1. Abnormal global strain and inferior basal hypokinesis . Left ventricular ejection fraction, by estimation, is 55%. The left ventricle has normal function. The left ventricle demonstrates regional wall motion abnormalities (see scoring diagram/findings  for description). There is moderate left ventricular hypertrophy. Left ventricular diastolic parameters are consistent with Grade II diastolic dysfunction (pseudonormalization). Elevated left ventricular end-diastolic pressure. The average left ventricular global longitudinal strain is -13.8 %. The global longitudinal strain is abnormal.  2. Right ventricular systolic function is normal. The right ventricular size is normal. There is normal pulmonary artery systolic pressure.  3. Left atrial size was mildly dilated.  4. The mitral valve is abnormal. Trivial mitral valve regurgitation. No evidence of mitral stenosis.  5. The aortic valve is tricuspid. There is mild calcification of the aortic valve. There is mild thickening of the aortic valve. Aortic valve regurgitation is not visualized. Aortic valve sclerosis is present, with no evidence of aortic valve stenosis.  6. The inferior vena cava is normal in size with greater than 50% respiratory variability, suggesting right atrial pressure of 3 mmHg.  Left Heart Catheterization 08/25/22   Prox RCA to Mid RCA lesion is 100% stenosed.   Ost LM lesion is 99% stenosed.   Prox LAD to Mid LAD lesion is 100% stenosed.   Origin to Prox Graft lesion is 100% stenosed.   Prox Graft lesion is 100% stenosed.   Dist Cx lesion is 99% stenosed.   A drug-eluting stent was successfully placed using a STENT ONYX FRONTIER 2.5X12.   Post  intervention, there is a 0% residual stenosis.  IMPRESSION: Ms. Galaska has 2 patent grafts of the 5 that were placed 8 years ago in [facility].  The patient did not have graft markers so it was difficult to identify the grafts although a supravalvular aortogram only showed 1 patent vein graft.  The LIMA was widely patent and gave left-to-right collaterals from the LAD to the occluded RCA.  She had an excellent result from PCI drug-eluting stenting of the obtuse marginal branch vein graft beyond insertion which I suspect is  responsible for her accelerated symptoms.  Given the fact that she was on Eliquis she will need to be on "triple therapy" for 1 month after which aspirin can be discontinued.  I am going to restart heparin 6 hours after sheath removal without a bolus and probably begin her on Eliquis tomorrow.  The sheath was sewn securely in place.  The patient left lab in stable condition.   Diagnostic Dominance: Right  Intervention     Patient Profile     57 y.o. female with a past medical history of CAD s/p CABG, PAF, PVD, bipolar disorder, depression, HTN, HL, DM and tobacco abuse admitted with CP.     Assessment & Plan    Unstable Angina CAD s/p CABG  - Patient previously had a CABG 8 years ago in [facility], she presented complaining of increased frequency of central chest pressure that occurred with minimal movement and while at rest. Pain improved with nitroglycerin, and she was taking 2-3 tablets daily. Of note, her cardiologist had recommended cardiac catheterization in 01/2022, but it was not completed  - She underwent cardiac catheterization with Dr. Gwenlyn Found on 2/19. She was noted to have high-grade circumflex SVG-OM insertion stenosis, and received DES. LIMA-Lad was patent. Her RCA is occluded and not grafted. The remainder of her vein grafts are occluded  - Patient will need to be on "triple therapy" for 30 days with ASA, plavix, and eliquis. After 30 days, she should stop asa - Continue lipitor 80 mg daily  - Resume carvedilol 25 mg BID   HLD  - Lipid panel from 08/23/22 showed LDL 109, lipoptorein a 149.5, HDL 47, triglycerides 115  - Continue lipitor 80 mg daily  - Will need outpatient referral to the pharmD lipid clinic to consider PCSK9 inibitor   PAF  - Resume carvedilol 25 mg BID  - Resume eliquis 5 mg BID   Tobacco Abuse  - On wellburtrin and nicotine patch  - Counseled on tobacco cessation   HTN  - BP was elevated earlier this admission. Required IV nitroglycerin  during cath. After cath, patient was given hydralazine and her BP dropped to 78/42.  - Now back on home medications- amlodipine 5 mg daily, carvedilol 25 mg BID, hydrochlorothiazide 25 mg daily, and losartan 100 mg daily  - BP better controlled on home therapies    For questions or updates, please contact [place] Please consult [email or web address] for contact info under        Signed, Margie Billet, PA-C  08/26/2022, 8:48 AM    History and all data above reviewed.  Patient examined.  I agree with the findings as above.  No pain.  No SOB.  Ready to go home.  l available labs, radiology testing, previous records reviewed. Agree with documented assessment and plan. CAD:  Status post PCI.  Discussed her meds including triple anticoagulation.  Follow up as above.  BP back up so  resumed previous meds.    Jeneen Rinks Nica Friske  11:46 AM  08/26/2022

## 2022-08-26 NOTE — Discharge Summary (Signed)
Discharge Summary    Patient ID: Rachael Becker MRN: [id number]; DOB: 06-24-1966  Admit date: 08/22/2022 Discharge date: 08/26/2022  PCP:  Wynelle Fanny, [place] [facility] Cardiologist:  [name], MD        Discharge Diagnoses    Principal Problem:   Unstable angina [facility]) Active Problems:   Essential hypertension, benign   Hyperlipidemia   Coronary artery disease involving coronary bypass graft of native heart with angina pectoris ([place])   Paroxysmal atrial fibrillation ([place])   Tobacco use    Diagnostic Studies/Procedures    Echocardiogram 08/23/22  1. Abnormal global strain and inferior basal hypokinesis . Left ventricular ejection fraction, by estimation, is 55%. The left ventricle has normal function. The left ventricle demonstrates regional wall motion abnormalities (see scoring diagram/findings for description). There is moderate left ventricular hypertrophy. Left ventricular diastolic parameters are consistent with Grade II diastolic dysfunction (pseudonormalization). Elevated left ventricular end-diastolic pressure. The average left ventricular global longitudinal strain is -13.8 %. The global longitudinal strain is abnormal.  2. Right ventricular systolic function is normal. The right ventricular size is normal. There is normal pulmonary artery systolic pressure.  3. Left atrial size was mildly dilated.  4. The mitral valve is abnormal. Trivial mitral valve regurgitation. No evidence of mitral stenosis.  5. The aortic valve is tricuspid. There is mild calcification of the aortic valve. There is mild thickening of the aortic valve. Aortic valve regurgitation is not visualized. Aortic valve sclerosis is present, with no evidence of aortic valve stenosis.  6. The inferior vena cava is normal in size with greater than 50% respiratory variability, suggesting right atrial pressure of 3 mmHg.   Left Heart Catheterization 08/25/22   Prox RCA to  Mid RCA lesion is 100% stenosed.   Ost LM lesion is 99% stenosed.   Prox LAD to Mid LAD lesion is 100% stenosed.   Origin to Prox Graft lesion is 100% stenosed.   Prox Graft lesion is 100% stenosed.   Dist Cx lesion is 99% stenosed.   A drug-eluting stent was successfully placed using a STENT ONYX FRONTIER 2.5X12.   Post intervention, there is a 0% residual stenosis.   IMPRESSION: Rachael Becker has 2 patent grafts of the 5 that were placed 8 years ago in [facility].  The patient did not have graft markers so it was difficult to identify the grafts although a supravalvular aortogram only showed 1 patent vein graft.  The LIMA was widely patent and gave left-to-right collaterals from the LAD to the occluded RCA.  She had an excellent result from PCI drug-eluting stenting of the obtuse marginal branch vein graft beyond insertion which I suspect is responsible for her accelerated symptoms.  Given the fact that she was on Eliquis she will need to be on "triple therapy" for 1 month after which aspirin can be discontinued.  I am going to restart heparin 6 hours after sheath removal without a bolus and probably begin her on Eliquis tomorrow.  The sheath was sewn securely in place.  The patient left lab in stable condition.    Diagnostic Dominance: Right  Intervention    _____________   History of Present Illness     Rachael Becker is a 57 y.o. female with past medical history of CAD s/p CABG x 4 at [facility] in 2016, paroxysmal atrial fibrillation on Eliquis, PVD, history of back surgery, bipolar disorder, depression, tobacco use, HTN, HLD, type 2 diabetes.  Per  chart review, patient was initially referred to Dr. Gwenlyn Found for evaluation of lower extremity claudication symptoms.  Lower extremity arterial Dopplers in 07/2021 showed right ABI of 0.72, left ABI 0.68.  She underwent PV angiogram/intervention on 11/07/2021 with Dr. Alvester Chou and was found to have an occluded left SFA that was not percutaneously  addressable, and high-grade segmental proximal, mid and distal right SFA disease.  Patient was treated with directional arthrectomy and drug-coated balloon angioplasty of the proximal, mid, and distal SFA stenosis.  She was most recently seen by Dr. Alvester Chou in June 2023 at which time she was doing well.  Her right SFA was widely patent on follow-up duplex performed in May 2023.  Patient was seen by Almyra Deforest PA-C in the office on 2/16 for evaluation of unstable angina.  It was noted that patient's cardiologist at [facility] had actually recommended cardiac catheterization around July 2023, however this was never completed.  At her appointment, patient reported that since July of last year, she had been having increasing exertional chest pain and worsening dyspnea.  Chest pain at has started to occur multiple times in a day and patient was taking 3-4 nitroglycerin every day.  EKG in the office was unchanged, showed chronic T wave inversion in the lateral leads.  Patient was admitted to [facility] for cardiac catheterization.   Hospital Course     Consultants: None   Unstable Angina CAD s/p CABG  - Patient previously had a CABG x4 in 2016 at [facility]  - Patient was seen in our cardiology office on 2/16 and complained of increased frequency of central chest pressure that occurred with minimal movement and while at rest. Pain improved with nitroglycerin, and she was taking 3-4 nitroglycerin tablets daily. Of note, her cardiologist had recommended cardiac catheterization in 01/2022, but it was not completed  - Patient was admitted to [facility] for cardiac catheterization  - Echocardiogram 2/17 showed EF 55% with regional wall motion abnormalities, grade II diastolic dysfunction  - She underwent cardiac catheterization with Dr. Gwenlyn Found on 2/19. She was noted to have high-grade circumflex SVG-OM insertion stenosis, that was treated with DE. LIMA-Lad was patent. Her RCA was occluded and  not grafted. The remainder of her vein grafts are occluded  - Patient will need to be on "triple therapy" for 30 days with ASA, plavix, and eliquis. After 30 days, she should stop asa. Discussed that patient should be aware of any bleeding she may have while on triple therapy.  - Continue lipitor 80 mg daily  - Continue carvedilol 25 mg BID  - Right groin site was stable prior to DC. Discussed site care and provided written instructions on discharge paperwork    HLD  - Lipid panel from 08/23/22 showed LDL 109, lipoptorein a 149.5, HDL 47, triglycerides 115  - Continue lipitor 80 mg daily -- new this admission. Needs lipid panel and LFTs in 8 weeks  - Patient is on repatha    PAF  - Continue carvedilol 25 mg BID  - Resumed eliquis 5 mg BID the morning after cardiac catheterization    Tobacco Abuse  - On wellburtrin and nicotine patch  - Counseled on tobacco cessation    HTN  - BP was elevated earlier this admission. Required IV nitroglycerin during cath. After cath, patient was given hydralazine and her BP dropped to 78/42.  - Home medications were resumed prior to DC- amlodipine 5 mg daily, carvedilol 25 mg BID, hydrochlorothiazide 25 mg daily,  and losartan 100 mg daily  - BP was much better controlled on home therapies    Patient was seen and examined by Dr. Percival Spanish and was deemed stable for discharge.   Patient has a follow up appointment on 3/4 with general cardiology   Did the patient have an acute coronary syndrome (MI, NSTEMI, STEMI, etc) this admission?:  No                               Did the patient have a percutaneous coronary intervention (stent / angioplasty)?:  Yes.     Cath/PCI Registry Performance & Quality Measures: Aspirin prescribed? - Yes ADP Receptor Inhibitor (Plavix/Clopidogrel, Brilinta/Ticagrelor or Effient/Prasugrel) prescribed (includes medically managed patients)? - Yes High Intensity Statin (Lipitor 40-65m or Crestor 20-474m prescribed? - Yes For EF  <40%, was ACEI/ARB prescribed? - Not Applicable (EF >/= 40AB-123456789For EF <40%, Aldosterone Antagonist (Spironolactone or Eplerenone) prescribed? - Not Applicable (EF >/= 40AB-123456789Cardiac Rehab Phase II ordered? - Yes       _____________  Discharge Vitals Blood pressure 109/77, pulse 63, temperature 97.6 F (36.4 C), temperature source Oral, resp. rate 15, height 5' 5"$  (1.651 m), weight 82.6 kg, SpO2 98 %.  Filed Weights   08/24/22 0034 08/24/22 1632 08/25/22 0434  Weight: 82.9 kg 83.6 kg 82.6 kg    Labs & Radiologic Studies    CBC Recent Labs    08/26/22 0455  WBC 9.1  HGB 14.6  HCT 47.8*  MCV 83.6  PLT [id number] Basic Metabolic Panel Recent Labs    08/26/22 0455  NA 139  K 4.4  CL 102  CO2 27  GLUCOSE 121*  BUN 17  CREATININE 0.96  CALCIUM 9.8   Liver Function Tests No results for input(s): "AST", "ALT", "ALKPHOS", "BILITOT", "PROT", "ALBUMIN" in the last 72 hours. No results for input(s): "LIPASE", "AMYLASE" in the last 72 hours. High Sensitivity Troponin:   Recent Labs  Lab 08/22/22 2109 08/22/22 2304  TROPONINIHS 8 8    BNP Invalid input(s): "POCBNP" D-Dimer No results for input(s): "DDIMER" in the last 72 hours. Hemoglobin A1C No results for input(s): "HGBA1C" in the last 72 hours. Fasting Lipid Panel No results for input(s): "CHOL", "HDL", "LDLCALC", "TRIG", "CHOLHDL", "LDLDIRECT" in the last 72 hours. Thyroid Function Tests No results for input(s): "TSH", "T4TOTAL", "T3FREE", "THYROIDAB" in the last 72 hours.  Invalid input(s): "FREET3" _____________  CARDIAC CATHETERIZATION  Result Date: 08/25/2022 Images from the original result were not included.   Prox RCA to Mid RCA lesion is 100% stenosed.   Ost LM lesion is 99% stenosed.   Prox LAD to Mid LAD lesion is 100% stenosed.   Origin to Prox Graft lesion is 100% stenosed.   Prox Graft lesion is 100% stenosed.   Dist Cx lesion is 99% stenosed.   A drug-eluting stent was successfully placed using a STENT ONYX  FRONTIER 2.5X12.   Post intervention, there is a 0% residual stenosis. VeCLAREE MCINERNYs a [age].o. female  [id number]:  [facility]IAN: JoQuay BurowM.D. 01/1966-01-30ATE OF PROCEDURE:  08/25/2022 DATE OF DISCHARGE: CARDIAC CATHETERIZATION / PCI DES LCX OM (AFTER SVG) History obtained from chart review.[age].o. female  with CAD s/p CABG (2016 in [place] PAF, PVD (status post right SFA intervention by myself May 2023)., bipolar disorder, depression, HTN, HL, DM and tobacco abuse admitted with unstable angina her troponins were negative.  Her  Eliquis was held.  She presents now for diagnostic coronary angiography.   PROCEDURE DESCRIPTION: The patient was brought to the second floor [place] [facility] cath lab in the postabsorptive state.  She was premedicated with IV Versed and fentanyl.  Her right groin was prepped and shaved in usual sterile fashion. Xylocaine 1% was used for local anesthesia. A 5 French sheath was inserted into the right common femoral artery using standard Seldinger technique.  Ultrasound was used to identify the right common femoral artery and guide access.  A digital image was captured and placed the patient's chart.  5 French right left Judkins diagnostic catheters over the 5 French pigtail catheter were used for selective coronary angiography, selective vein graft and IMA graft angiography, obtain left heart pressures and supravalvular aortography.  Isovue dye was used for the entirety of the case (200 cc of contrast total to patient).  Retrograde aortic, ventricular and pullback pressures were recorded. The 5 French sheath in the right common femoral artery was exchanged over an 035 J-wire for a 6 [place] sheath.  The patient received 60 mg of p.o. clopidogrel in addition to 12,000's of heparin with an ACT measured at 309 at the end of the case.  Isovue dye is used for the entirety of the intervention.  Retrograde aortic pressures monitored to the case. Using a 6 French  LCB guide catheter along the 0.14 P[place] guidewire and a 2 mm x 12 mm balloon the distal circumflex marginal vein graft anastomosis was crossed without difficulty.  The lesion just beyond the anastomosis was then dilated and stented with a 2.5 mm x 16 mm long Medtronic frontier Onyx drug-eluting stent up to 16 atm (2.66 mm) resulting reduction of a 95 to 99% stenosis just beyond graft insertion to 0% residual.  Patient had reproducible pain with balloon inflation similar to her pain prior to admission.  She did become significantly hypertensive and was treated with 25 mcg of fentanyl, 10 mg of hydralazine and a nitroglycerin drip which resulted in significant improvement in her blood pressure.   Ms. Quamme has 2 patent grafts of the 5 that were placed 8 years ago in [facility].  The patient did not have graft markers so it was difficult to identify the grafts although a supravalvular aortogram only showed 1 patent vein graft.  The LIMA was widely patent and gave left-to-right collaterals from the LAD to the occluded RCA.  She had an excellent result from PCI drug-eluting stenting of the obtuse marginal branch vein graft beyond insertion which I suspect is responsible for her accelerated symptoms.  Given the fact that she was on Eliquis she will need to be on "triple therapy" for 1 month after which aspirin can be discontinued.  I am going to restart heparin 6 hours after sheath removal without a bolus and probably begin her on Eliquis tomorrow.  The sheath was sewn securely in place.  The patient left lab in stable condition. [name]. MD, [facility] 08/25/2022 10:37 AM    PERIPHERAL VASCULAR CATHETERIZATION  Result Date: 08/25/2022 Images from the original result were not included.   Prox RCA to Mid RCA lesion is 100% stenosed.   Ost LM lesion is 99% stenosed.   Prox LAD to Mid LAD lesion is 100% stenosed.   Origin to Prox Graft lesion is 100% stenosed.   Prox Graft lesion is 100% stenosed.   Dist Cx lesion  is 99% stenosed.   A drug-eluting stent was successfully placed using a  STENT ONYX FRONTIER 2.5X12.   Post intervention, there is a 0% residual stenosis. Rachael Becker is a 57 y.o. female  [id number] LOCATION:  FACILITY: [place] PHYSICIAN: [name], M.D. [birth year] DATE OF PROCEDURE:  08/25/2022 DATE OF DISCHARGE: CARDIAC CATHETERIZATION / PCI DES LCX OM (AFTER SVG) History obtained from chart review.57 y.o. female  with CAD s/p CABG (2016 in [place]), PAF, PVD (status post right SFA intervention by myself May 2023)., bipolar disorder, depression, HTN, HL, DM and tobacco abuse admitted with unstable angina her troponins were negative.  Her Eliquis was held.  She presents now for diagnostic coronary angiography.   PROCEDURE DESCRIPTION: The patient was brought to the second floor [place] [facility] cath lab in the postabsorptive state.  She was premedicated with IV Versed and fentanyl.  Her right groin was prepped and shaved in usual sterile fashion. Xylocaine 1% was used for local anesthesia. A 5 French sheath was inserted into the right common femoral artery using standard Seldinger technique.  Ultrasound was used to identify the right common femoral artery and guide access.  A digital image was captured and placed the patient's chart.  5 French right left Judkins diagnostic catheters over the 5 French pigtail catheter were used for selective coronary angiography, selective vein graft and IMA graft angiography, obtain left heart pressures and supravalvular aortography.  Isovue dye was used for the entirety of the case (200 cc of contrast total to patient).  Retrograde aortic, ventricular and pullback pressures were recorded. The 5 French sheath in the right common femoral artery was exchanged over an 035 J-wire for a 6 [place] sheath.  The patient received 60 mg of p.o. clopidogrel in addition to 12,000's of heparin with an ACT measured at 309 at the end of the case.  Isovue dye is used for the entirety of the  intervention.  Retrograde aortic pressures monitored to the case. Using a 6 French LCB guide catheter along the 0.14 [place] guidewire and a 2 mm x 12 mm balloon the distal circumflex marginal vein graft anastomosis was crossed without difficulty.  The lesion just beyond the anastomosis was then dilated and stented with a 2.5 mm x 16 mm long Medtronic frontier Onyx drug-eluting stent up to 16 atm (2.66 mm) resulting reduction of a 95 to 99% stenosis just beyond graft insertion to 0% residual.  Patient had reproducible pain with balloon inflation similar to her pain prior to admission.  She did become significantly hypertensive and was treated with 25 mcg of fentanyl, 10 mg of hydralazine and a nitroglycerin drip which resulted in significant improvement in her blood pressure.   Ms. Geissinger has 2 patent grafts of the 5 that were placed 8 years ago in [facility].  The patient did not have graft markers so it was difficult to identify the grafts although a supravalvular aortogram only showed 1 patent vein graft.  The LIMA was widely patent and gave left-to-right collaterals from the LAD to the occluded RCA.  She had an excellent result from PCI drug-eluting stenting of the obtuse marginal branch vein graft beyond insertion which I suspect is responsible for her accelerated symptoms.  Given the fact that she was on Eliquis she will need to be on "triple therapy" for 1 month after which aspirin can be discontinued.  I am going to restart heparin 6 hours after sheath removal without a bolus and probably begin her on Eliquis tomorrow.  The sheath was sewn securely in place.  The patient left  lab in stable condition. [name]. MD, [facility] 08/25/2022 10:37 AM    ECHOCARDIOGRAM COMPLETE  Result Date: 08/23/2022    ECHOCARDIOGRAM REPORT   Patient Name:   Rachael Becker Date of Exam: 08/23/2022 Medical Rec #:  [id number]        Height:       65.0 in Accession #:    [id number]       Weight:       186.5 lb Date of Birth:   Aug 14, 1965         BSA:          1.920 m Patient Age:    69 years         BP:           131/76 mmHg Patient Gender: F                HR:           60 bpm. Exam Location:  Inpatient Procedure: 2D Echo, Cardiac Doppler, Color Doppler and Strain Analysis Indications:    Chest Pain R07.9  History:        Patient has no prior history of Echocardiogram examinations.                 PAD, Signs/Symptoms:Fatigue; Risk Factors:Hypertension.  Sonographer:    Luane School RDCS Referring Phys: [id number] [place]  1. Abnormal global strain and inferior basal hypokinesis . Left ventricular ejection fraction, by estimation, is 55%. The left ventricle has normal function. The left ventricle demonstrates regional wall motion abnormalities (see scoring diagram/findings for description). There is moderate left ventricular hypertrophy. Left ventricular diastolic parameters are consistent with Grade II diastolic dysfunction (pseudonormalization). Elevated left ventricular end-diastolic pressure. The average left ventricular global longitudinal strain is -13.8 %. The global longitudinal strain is abnormal.  2. Right ventricular systolic function is normal. The right ventricular size is normal. There is normal pulmonary artery systolic pressure.  3. Left atrial size was mildly dilated.  4. The mitral valve is abnormal. Trivial mitral valve regurgitation. No evidence of mitral stenosis.  5. The aortic valve is tricuspid. There is mild calcification of the aortic valve. There is mild thickening of the aortic valve. Aortic valve regurgitation is not visualized. Aortic valve sclerosis is present, with no evidence of aortic valve stenosis.  6. The inferior vena cava is normal in size with greater than 50% respiratory variability, suggesting right atrial pressure of 3 mmHg. FINDINGS  Left Ventricle: Abnormal global strain and inferior basal hypokinesis. Left ventricular ejection fraction, by estimation, is 55%. The left ventricle has  normal function. The left ventricle demonstrates regional wall motion abnormalities. The average left ventricular global longitudinal strain is -13.8 %. The global longitudinal strain is abnormal. The left ventricular internal cavity size was normal in size. There is moderate left ventricular hypertrophy. Left ventricular diastolic parameters are consistent with Grade II diastolic dysfunction (pseudonormalization). Elevated left ventricular end-diastolic pressure. Right Ventricle: The right ventricular size is normal. No increase in right ventricular wall thickness. Right ventricular systolic function is normal. There is normal pulmonary artery systolic pressure. The tricuspid regurgitant velocity is 1.72 m/s, and  with an assumed right atrial pressure of 3 mmHg, the estimated right ventricular systolic pressure is [id number] mmHg. Left Atrium: Left atrial size was mildly dilated. Right Atrium: Right atrial size was normal in size. Pericardium: There is no evidence of pericardial effusion. Mitral Valve: The mitral valve is abnormal. There is mild thickening of the mitral valve  leaflet(s). There is mild calcification of the mitral valve leaflet(s). Mild mitral annular calcification. Trivial mitral valve regurgitation. No evidence of mitral valve stenosis. Tricuspid Valve: The tricuspid valve is normal in structure. Tricuspid valve regurgitation is mild . No evidence of tricuspid stenosis. Aortic Valve: The aortic valve is tricuspid. There is mild calcification of the aortic valve. There is mild thickening of the aortic valve. Aortic valve regurgitation is not visualized. Aortic valve sclerosis is present, with no evidence of aortic valve stenosis. Pulmonic Valve: The pulmonic valve was normal in structure. Pulmonic valve regurgitation is not visualized. No evidence of pulmonic stenosis. Aorta: The aortic root is normal in size and structure. Venous: The inferior vena cava is normal in size with greater than 50% respiratory  variability, suggesting right atrial pressure of 3 mmHg. IAS/Shunts: The interatrial septum was not well visualized.  LEFT VENTRICLE PLAX 2D LVIDd:         4.60 cm   Diastology LVIDs:         3.10 cm   LV e' medial:    5.98 cm/s LV PW:         1.50 cm   LV E/e' medial:  17.6 LV IVS:        1.50 cm   LV e' lateral:   6.64 cm/s LVOT diam:     2.00 cm   LV E/e' lateral: 15.8 LV SV:         63 LV SV Index:   33        2D Longitudinal Strain LVOT Area:     3.14 cm  2D Strain GLS Avg:     -13.8 %  RIGHT VENTRICLE             IVC RV S prime:     11.00 cm/s  IVC diam: 1.90 cm TAPSE (M-mode): 1.7 cm LEFT ATRIUM             Index        RIGHT ATRIUM           Index LA diam:        3.90 cm 2.03 cm/m   RA Area:     17.70 cm LA Vol (A2C):   77.2 ml 40.20 ml/m  RA Volume:   49.70 ml  25.88 ml/m LA Vol (A4C):   54.5 ml 28.38 ml/m LA Biplane Vol: 64.5 ml 33.59 ml/m  AORTIC VALVE LVOT Vmax:   101.00 cm/s LVOT Vmean:  58.700 cm/s LVOT VTI:    0.202 m  AORTA Ao Root diam: 2.90 cm Ao Asc diam:  3.30 cm Ao Desc diam: 2.50 cm MITRAL VALVE                TRICUSPID VALVE MV Area (PHT): 3.65 cm     TR Peak grad:   11.8 mmHg MV Decel Time: 208 msec     TR Vmax:        172.00 cm/s MV E velocity: 105.00 cm/s MV A velocity: 79.70 cm/s   SHUNTS MV E/A ratio:  1.32         Systemic VTI:  0.20 m                             Systemic Diam: 2.00 cm Jenkins Rouge MD Electronically signed by Jenkins Rouge MD Signature Date/Time: 08/23/2022/2:55:25 PM    Final    DG CHEST PORT 1 VIEW  Result Date: 08/22/2022 CLINICAL DATA:  Chest pain EXAM: PORTABLE CHEST 1 VIEW COMPARISON:  06/13/2021, CT 08/26/2021, 05/26/2022 FINDINGS: Post sternotomy changes. No consolidation or pleural effusion. Mild bronchitic changes in the lower lungs. Borderline cardiac enlargement. Aortic atherosclerosis. No pneumothorax IMPRESSION: No active disease. Mild bronchitic changes in the lower lungs. Electronically Signed   By: Donavan Foil M.D.   On: 08/22/2022 23:31    Disposition   Pt is being discharged home today in good condition.  Follow-up Plans & Appointments     Follow-up Information     Almyra Deforest, [place] Follow up on 09/08/2022.   Specialties: Cardiology, Radiology Why: Appointment at 10:55 AM Contact information: [address] [place] [address] [phone]                Discharge Instructions     Amb Referral to Cardiac Rehabilitation   Complete by: As directed    [facility]   Diagnosis: Coronary Stents   After initial evaluation and assessments completed: Virtual Based Care may be provided alone or in conjunction with Phase 2 Cardiac Rehab based on patient barriers.: Yes   Intensive Cardiac Rehabilitation (ICR) [place] location only OR Traditional Cardiac Rehabilitation (TCR) *If criteria for ICR are not met will enroll in TCR [facility] only): Yes   Diet - low sodium heart healthy   Complete by: As directed    Discharge instructions   Complete by: As directed    Groin Site Care Refer to this sheet in the next few weeks. These instructions provide you with information on caring for yourself after your procedure. Your caregiver may also give you more specific instructions. Your treatment has been planned according to current medical practices, but problems sometimes occur. Call your caregiver if you have any problems or questions after your procedure. HOME CARE INSTRUCTIONS You may shower 24 hours after the procedure. Remove the bandage (dressing) and gently wash the site with plain soap and water. Gently pat the site dry.  Do not apply powder or lotion to the site.  Do not sit in a bathtub, swimming pool, or whirlpool for 5 to 7 days.  No bending, squatting, or lifting anything over 10 pounds (4.5 kg) as directed by your caregiver.  Inspect the site at least twice daily.  Do not drive home if you are discharged the same day of the procedure. Have someone else drive you.  You may drive 24 hours after the procedure  unless otherwise instructed by your caregiver.  What to expect: Any bruising will usually fade within 1 to 2 weeks.  Blood that collects in the tissue (hematoma) may be painful to the touch. It should usually decrease in size and tenderness within 1 to 2 weeks.  SEEK IMMEDIATE MEDICAL CARE IF: You have unusual pain at the groin site or down the affected leg.  You have redness, warmth, swelling, or pain at the groin site.  You have drainage (other than a small amount of blood on the dressing).  You have chills.  You have a fever or persistent symptoms for more than 72 hours.  You have a fever and your symptoms suddenly get worse.  Your leg becomes pale, cool, tingly, or numb.  You have heavy bleeding from the site. Hold pressure on the site. .   Increase activity slowly   Complete by: As directed         Discharge Medications   Allergies as of 08/26/2022       Reactions   Ace Inhibitors Cough  Naprosyn [naproxen] Nausea And Vomiting        Medication List     STOP taking these medications    irbesartan 150 MG tablet Commonly known as: AVAPRO   QUEtiapine 50 MG tablet Commonly known as: SEROQUEL   rosuvastatin 5 MG tablet Commonly known as: Crestor       TAKE these medications    albuterol 108 (90 Base) MCG/ACT inhaler Commonly known as: VENTOLIN HFA Inhale 2 puffs into the lungs every 4 (four) hours as needed (COPD).   ALPRAZolam 1 MG tablet Commonly known as: XANAX Take 1 tablet (1 mg total) by mouth 2 (two) times daily as needed for anxiety. What changed: when to take this   amLODipine 5 MG tablet Commonly known as: NORVASC Take 1 tablet (5 mg total) by mouth daily. Start taking on: August 27, 2022 What changed:  medication strength how much to take   aspirin EC 81 MG tablet Take 1 tablet (81 mg total) by mouth daily. Swallow whole. Take for 30 days, then stop.   atorvastatin 80 MG tablet Commonly known as: LIPITOR Take 1 tablet (80 mg total)  by mouth daily. Start taking on: August 27, 2022   Breo Ellipta 100-25 MCG/ACT Aepb Generic drug: fluticasone furoate-vilanterol Inhale 1 puff into the lungs daily.   buPROPion ER 100 MG 12 hr tablet Commonly known as: WELLBUTRIN SR Take 200 mg by mouth daily.   carvedilol 25 MG tablet Commonly known as: COREG Take 25 mg by mouth 2 (two) times daily.   clopidogrel 75 MG tablet Commonly known as: PLAVIX Take 1 tablet (75 mg total) by mouth daily with breakfast.   Eliquis 5 MG Tabs tablet Generic drug: apixaban Take 1 tablet (5 mg total) by mouth 2 (two) times daily.   hydrochlorothiazide 25 MG tablet Commonly known as: HYDRODIURIL Take 1 tablet (25 mg total) by mouth daily.   hydrOXYzine 25 MG tablet Commonly known as: ATARAX Take 25 mg by mouth 3 (three) times daily as needed for anxiety.   losartan 100 MG tablet Commonly known as: COZAAR Take 100 mg by mouth daily.   methocarbamol 500 MG tablet Commonly known as: ROBAXIN Take 1 tablet (500 mg total) by mouth 2 (two) times daily. What changed:  how much to take when to take this reasons to take this   montelukast 10 MG tablet Commonly known as: SINGULAIR Take 10 mg by mouth at bedtime.   nicotine 14 mg/24hr patch Commonly known as: NICODERM CQ - dosed in mg/24 hours Place 1 patch (14 mg total) onto the skin daily. Start taking on: August 27, 2022   nitroGLYCERIN 0.4 MG SL tablet Commonly known as: NITROSTAT Place 1 tablet (0.4 mg total) under the tongue every 5 (five) minutes x 3 doses as needed for chest pain.   oxyCODONE-acetaminophen 10-325 MG tablet Commonly known as: PERCOCET Take 1 tablet by mouth in the morning, at noon, in the evening, and at bedtime.   Repatha SureClick [id number] MG/ML Soaj Generic drug: Evolocumab Inject 140 mg into the skin every 14 (fourteen) days.   venlafaxine XR 150 MG 24 hr capsule Commonly known as: EFFEXOR-XR Take 300 mg by mouth daily with breakfast. What changed:  Another medication with the same name was removed. Continue taking this medication, and follow the directions you see here.           Outstanding Labs/Studies   LFTs and lipid panel in 8 weeks.    Duration of Discharge Encounter  Greater than 30 minutes including physician time.  Signed, Margie Billet, PA-C 08/26/2022, 1:21 PM

## 2022-08-26 NOTE — Progress Notes (Addendum)
CARDIAC REHAB PHASE I   PRE:  Rate/Rhythm: 81 NSR  BP:  Sitting: 141/91      SaO2: 96 RA  MODE:  Ambulation: 470 ft   AD:  Cane  POST:  Rate/Rhythm: 82 NSR  BP:  Sitting: 125/84      SaO2: 96 RA  Pt amb with standby assistance, pt denies CP and SOB during amb and was returned to room w/o complaint.   Pt was educated on stent card, stent location, Eliquis, Plavix and ASA use, wt restrictions, no baths/daily wash-ups, s/s of infection, ex guidelines (progressive walking and resistance exercise), s/s to stop exercising, NTG use and calling 911, heart healthy diet, risk factors (diabetic, smoking) and CRPII. Pt received materials on exercise and diet. Will refer to [facility].     Christen Bame  12:08 PM 08/26/2022    Service time is from 1128 to 1150.

## 2022-08-27 ENCOUNTER — Telehealth (HOSPITAL_COMMUNITY): Payer: Self-pay

## 2022-08-27 NOTE — Telephone Encounter (Signed)
Per phase I cardiac rehab, fax referral to [facility].

## 2022-09-07 ENCOUNTER — Encounter: Payer: Self-pay | Admitting: Physician Assistant

## 2022-09-07 NOTE — Progress Notes (Deleted)
Cardiology Office Note:    Date:  09/07/2022   ID:  Rachael Becker, DOB 01-04-1966, MRN [id number]  PCP:  Wynelle Fanny, [place] Providers Cardiologist:  [name], MD { Click to update primary MD,subspecialty MD or APP then REFRESH:1}    Referring MD: Wynelle Fanny, DO   No chief complaint on file. ***  History of Present Illness:    Rachael Becker is a 57 y.o. female with a hx of coronary artery disease (s/p CABG x 5 at [facility] in 2016), hypertension, PAD, PAF (Eliquis CHADSVASC 4), HLD, anxiety, depression, DM2, central lobar emphysema.  She initially establish care with Dr. Gwenlyn Found for evaluation of lower extremity claudication symptoms.  Extremity arterial Dopplers in January 2023 showed a right ABI of 0.72, left ABI 0.68, she underwent PVD angiogram/intervention on 11/07/2021 with Dr. Alvester Chou and was found to have an occluded left SFA that was not percutaneously addressable, and high-grade segmental proximal, mid and distal right SFA disease.  She was treated with directional arthrectomy and drug-coated balloon angioplasty of the proximal, mid, and distal SFA stenosis.   She was evaluated by Almyra Deforest PA-C on 08/22/22 for unstable angina, she had been having increasing exertional chest pain, worsening dyspnea since July 2023, she was taking NTG several times throughout the day--she was evaluated by her cardiologist at [facility] and was recommended to have a cardiac catheterization however this was not completed.  EKG in the office was unchanged, showed chronic T wave inversion in the lateral leads, she was admitted to the hospital for cardiac catheterization.  She was admitted on 08/22/2022 following the above visit, echocardiogram on 2/17 showed an EF of 55% with regional wall motion abnormalities, grade 2 DD, she underwent cardiac catheterization with Dr. Gwenlyn Found on 2/19, she was noted to have high-grade circumflex SVG-OM insertion stenosis, that was  treated with DE.  Her LIMA-LAD was patent.  Her RCA was occluded and not grafted.  The remainder of her vein grafts were occluded.  She was discharged on triple therapy for 30 days with aspirin, Plavix, Eliquis.  After 30 days she can stop her aspirin.   Referral to pharmD for PCSK9 inhibitor.  Repeat ABI and LEA 1 month after cath  Past Medical History:  Diagnosis Date   Bipolar disorder ([place])    Depression    Diabetes mellitus without complication ([place])    Dysrhythmia    a fib   Heart disease    History of chicken pox    Hyperlipidemia    Hypertension     Past Surgical History:  Procedure Laterality Date   ABDOMINAL AORTOGRAM W/LOWER EXTREMITY N/A 11/07/2021   Procedure: ABDOMINAL AORTOGRAM W/LOWER EXTREMITY;  Surgeon: Lorretta Harp, MD;  Location: [place] [facility];  Service: Cardiovascular;  Laterality: N/A;   ABDOMINAL HYSTERECTOMY  2001   AORTIC ARCH ANGIOGRAPHY N/A 08/25/2022   Procedure: AORTIC ARCH ANGIOGRAPHY;  Surgeon: Lorretta Harp, MD;  Location: [place] [facility];  Service: Cardiovascular;  Laterality: N/A;   BYPASS GRAFT  01.12.2016   CORONARY STENT INTERVENTION N/A 08/25/2022   Procedure: CORONARY STENT INTERVENTION;  Surgeon: Lorretta Harp, MD;  Location: [place] [facility];  Service: Cardiovascular;  Laterality: N/A;   LEFT HEART CATH AND CORS/GRAFTS ANGIOGRAPHY N/A 08/25/2022   Procedure: LEFT HEART CATH AND CORS/GRAFTS ANGIOGRAPHY;  Surgeon: Lorretta Harp, MD;  Location: [place] [facility];  Service: Cardiovascular;  Laterality: N/A;   MULTIPLE TOOTH  EXTRACTIONS     Denturees   PERIPHERAL VASCULAR ATHERECTOMY  11/07/2021   Procedure: PERIPHERAL VASCULAR ATHERECTOMY;  Surgeon: Lorretta Harp, MD;  Location: [place] [facility];  Service: Cardiovascular;;  Rt SFA   TONSILLECTOMY  1970   WISDOM TOOTH EXTRACTION      Current Medications: No outpatient medications have been marked as taking for the 09/08/22 encounter (Appointment) with Almyra Deforest, [place].      Allergies:   Ace inhibitors and Naprosyn [naproxen]   Social History   Socioeconomic History   Marital status: Single    Spouse name: Not on file   Number of children: Not on file   Years of education: Not on file   Highest education level: Not on file  Occupational History   Not on file  Tobacco Use   Smoking status: Every Day    Packs/day: 1.00    Types: Cigarettes    Last attempt to quit: 07/07/2014    Years since quitting: 8.1   Smokeless tobacco: Never  Vaping Use   Vaping Use: Never used  Substance and Sexual Activity   Alcohol use: Not Currently    Alcohol/week: 10.0 standard drinks of alcohol    Types: 10 Standard drinks or equivalent per week    Comment: occ   Drug use: No   Sexual activity: Not Currently    Birth control/protection: Surgical  Other Topics Concern   Not on file  Social History Narrative   Not on file   Social Determinants of Health   Financial Resource Strain: Not on file  Food Insecurity: No Food Insecurity (08/24/2022)   Hunger Vital Sign    Worried About Running Out of Food in the Last Year: Never true    Ran Out of Food in the Last Year: Never true  Transportation Needs: No Transportation Needs (08/24/2022)   PRAPARE - Hydrologist (Medical): No    Lack of Transportation (Non-Medical): No  Physical Activity: Not on file  Stress: Not on file  Social Connections: Not on file     Family History: The patient's ***family history includes Depression in her father; Healthy in her daughter and son; Heart disease in her father; Hypertension in her father, mother, and sister; Stroke (age of onset: 40) in her father.  ROS:   Please see the history of present illness.    *** All other systems reviewed and are negative.  EKGs/Labs/Other Studies Reviewed:    The following studies were reviewed today:  Echocardiogram 08/23/22  1. Abnormal global strain and inferior basal hypokinesis . Left ventricular ejection  fraction, by estimation, is 55%. The left ventricle has normal function. The left ventricle demonstrates regional wall motion abnormalities (see scoring diagram/findings for description). There is moderate left ventricular hypertrophy. Left ventricular diastolic parameters are consistent with Grade II diastolic dysfunction (pseudonormalization). Elevated left ventricular end-diastolic pressure. The average left ventricular global longitudinal strain is -13.8 %. The global longitudinal strain is abnormal.  2. Right ventricular systolic function is normal. The right ventricular size is normal. There is normal pulmonary artery systolic pressure.  3. Left atrial size was mildly dilated.  4. The mitral valve is abnormal. Trivial mitral valve regurgitation. No evidence of mitral stenosis.  5. The aortic valve is tricuspid. There is mild calcification of the aortic valve. There is mild thickening of the aortic valve. Aortic valve regurgitation is not visualized. Aortic valve sclerosis is present, with no evidence of aortic valve stenosis.  6. The  inferior vena cava is normal in size with greater than 50% respiratory variability, suggesting right atrial pressure of 3 mmHg.  Left Heart Catheterization 08/25/22   Prox RCA to Mid RCA lesion is 100% stenosed.   Ost LM lesion is 99% stenosed.   Prox LAD to Mid LAD lesion is 100% stenosed.   Origin to Prox Graft lesion is 100% stenosed.   Prox Graft lesion is 100% stenosed.   Dist Cx lesion is 99% stenosed.   A drug-eluting stent was successfully placed using a STENT ONYX FRONTIER 2.5X12.   Post intervention, there is a 0% residual stenosis.  IMPRESSION: Rachael Becker has 2 patent grafts of the 5 that were placed 8 years ago in [facility].  The patient did not have graft markers so it was difficult to identify the grafts although a supravalvular aortogram only showed 1 patent vein graft.  The LIMA was widely patent and gave left-to-right collaterals  from the LAD to the occluded RCA.  She had an excellent result from PCI drug-eluting stenting of the obtuse marginal branch vein graft beyond insertion which I suspect is responsible for her accelerated symptoms.  Given the fact that she was on Eliquis she will need to be on "triple therapy" for 1 month after which aspirin can be discontinued.  I am going to restart heparin 6 hours after sheath removal without a bolus and probably begin her on Eliquis tomorrow.  The sheath was sewn securely in place.  The patient left lab in stable condition.    Diagnostic Dominance: Right  11/15/21 ABI -   Right: Resting right ankle-brachial index is within normal range. No  evidence of significant right lower extremity arterial disease. The right  toe-brachial index is abnormal.   Left: Resting left ankle-brachial index indicates moderate left lower  extremity arterial disease. The left toe-brachial index is abnormal.  EKG:  EKG is *** ordered today.  The ekg ordered today demonstrates ***  Recent Labs: 08/22/2022: ALT 12; B Natriuretic Peptide 213.4 08/23/2022: TSH 4.199 08/26/2022: BUN 17; Creatinine, Ser 0.96; Hemoglobin 14.6; Platelets 219; Potassium 4.4; Sodium 139  Recent Lipid Panel    Component Value Date/Time   CHOL 179 08/23/2022 0041   TRIG 115 08/23/2022 0041   HDL 47 08/23/2022 0041   CHOLHDL 3.8 08/23/2022 0041   VLDL 23 08/23/2022 0041   LDLCALC 109 (H) 08/23/2022 0041     Risk Assessment/Calculations:   {Does this patient have ATRIAL FIBRILLATION?:[phone]}  No BP recorded.  {Refresh Note OR Click here to enter BP  :1}***         Physical Exam:    VS:  There were no vitals taken for this visit.    Wt Readings from Last 3 Encounters:  08/25/22 182 lb 3.2 oz (82.6 kg)  08/22/22 187 lb (84.8 kg)  05/17/22 210 lb (95.3 kg)     GEN: *** Well nourished, well developed in no acute distress HEENT: Normal NECK: No JVD; No carotid bruits LYMPHATICS: No lymphadenopathy CARDIAC:  ***RRR, no murmurs, rubs, gallops RESPIRATORY:  Clear to auscultation without rales, wheezing or rhonchi  ABDOMEN: Soft, non-tender, non-distended MUSCULOSKELETAL:  No edema; No deformity  SKIN: Warm and dry NEUROLOGIC:  Alert and oriented x 3 PSYCHIATRIC:  Normal affect   ASSESSMENT:    1. Coronary artery disease involving coronary bypass graft of native heart with unstable angina pectoris ([place])   2. Unstable angina ([place])   3. PAF (paroxysmal atrial fibrillation) ([place])   4. Peripheral arterial  disease ([place])   5. Hyperlipidemia, unspecified hyperlipidemia type   6. Essential hypertension, benign    PLAN:    In order of problems listed above:  Coronary artery disease - CABG x 5 in 2016 @ [place]; [facility] on 2/19 showed high-grade circumflex SVG-OM insertion stenosis, and received DES. LIMA-Lad was patent. Her RCA is occluded and not grafted. The remainder of her vein grafts are occluded. Continue triple therapy x 1 month. After 1 month, she can stop Aspirin. Continue carvedilol, ASA, Eliquis, Plavix, NTG as needed, lipitor.  PAF - CHADSVASC score of 4. Continue Eliquis, carvedilol.   HLD - recent LDL on 08/23/22 was 109, on Lipitor 80 mg daily. Refer to PharmD for PCSK9 consideration as LDL should be < 55.   PAD - Extremity arterial Dopplers in January 2023 showed a right ABI of 0.72, left ABI 0.68, she underwent PVD angiogram/intervention on 11/07/2021 with Dr. Alvester Chou and was found to have an occluded left SFA that was not percutaneously addressable, and high-grade segmental proximal, mid and distal right SFA disease  HTN - BP today       {Are you ordering a CV Procedure (e.g. stress test, cath, DCCV, TEE, etc)?   Press F2        :[id number]    Medication Adjustments/Labs and Tests Ordered: Current medicines are reviewed at length with the patient today.  Concerns regarding medicines are outlined above.  No orders of the defined types were placed in this encounter.  No  orders of the defined types were placed in this encounter.   There are no Patient Instructions on file for this visit.   Signed, Trudi Ida, NP  09/07/2022 5:18 PM    [place]

## 2022-09-08 ENCOUNTER — Ambulatory Visit: Payer: Medicare HMO | Admitting: Physician Assistant

## 2022-09-08 DIAGNOSIS — E785 Hyperlipidemia, unspecified: Secondary | ICD-10-CM

## 2022-09-08 DIAGNOSIS — I739 Peripheral vascular disease, unspecified: Secondary | ICD-10-CM

## 2022-09-08 DIAGNOSIS — I10 Essential (primary) hypertension: Secondary | ICD-10-CM

## 2022-09-08 DIAGNOSIS — I480 Paroxysmal atrial fibrillation: Secondary | ICD-10-CM

## 2022-09-08 DIAGNOSIS — I200 Unstable angina: Secondary | ICD-10-CM

## 2022-09-08 DIAGNOSIS — I25700 Atherosclerosis of coronary artery bypass graft(s), unspecified, with unstable angina pectoris: Secondary | ICD-10-CM

## 2022-09-14 NOTE — Progress Notes (Unsigned)
Cardiology Clinic Note   Date: 09/15/2022 ID: JESSALEE BORRELL, DOB 12-10-1965, MRN [id number]  Primary Cardiologist:  [name], MD  Patient Profile    JENNICE MULRONEY is a 57 y.o. female who presents to the clinic today for follow-up post LHC.  Past medical history significant for: CAD. CABG x 4 (performed at outside facility) January 2016. Echo 08/23/2022: EF 55%.  RWMA.  Moderate LVH.  Grade II DD.  Elevated LVEDP.  Mild LAE.  Aortic valve sclerosis without stenosis. LHC 08/25/2022 (unstable angina): Proximal to mid RCA 100%.  Ostial LM 99%.  Proximal to mid LAD 100%.  Origin to proximal graft 100%.  Proximal graft 100%.  Distal Cx 99%.  PCI with DES 2.5 x 16 to OM SVG.  Patent LIMA to LAD with left-to-right collaterals to occluded (not grafted) RCA.  Remaining vein grafts occluded.  Recommend triple therapy with aspirin, clopidogrel, and Eliquis for 1 month then discontinue aspirin. PAD. Vascular ultrasound aorta/IVC/iliac 08/05/2021: Aortoiliac atherosclerosis.  Widely patent bilateral common and external iliac arteries without evidence of stenosis.  Patent IVC. Arterial ultrasound lower extremities 08/06/2021: Right proximal SFA distal to the ostium 50 to 74%, tandem lesions mid SFA 75 to 99%.  Two-vessel runoff via PTA and peroneal artery.  Mid and distal ATA occlusion noted.  Left occluded ostial SFA with reconstitution of minimal flow further distally in the proximal segment.  One-vessel runoff via PTA.  Mid and distal ATA occlusion, possible occlusion in the proximal peroneal artery. Abdominal aortogram with lower extremity 11/07/2021: Atherectomy followed by drug-coated balloon angioplasty mid and distal right SFA.  Left SFA not treatable endovascularly.  Recommend triple therapy with low-dose aspirin, clopidogrel, and Eliquis for 30 days followed by discontinuation of aspirin.. Arterial ultrasound lower extremities and ABIs 11/15/2021: Right mid SFA 30 to 49%.  Right ABI within normal  range without evidence of significant lower extremity arterial disease.  Moderate left lower extremity arterial disease. PAF. Onset following CABG January 2016. 48-hour Holter (performed at an outside facility) June 2016: Showed sinus rhythm with PACs and PVCs. Hypertension. Hyperlipidemia. Lipid panel 08/23/2022: LDL 109, HDL 47, TG 115, total 179. T2DM. Tobacco abuse. COPD. OSA.   History of Present Illness    Danesa Nasir Selan was first evaluated by Dr. Henrene Pastor on 07/16/2021 for PAD at the request of [name], PA-C from [place] [facility].  Patient presented to Dr. Gwenlyn Found with a 5 to 6-year history of right greater than left claudication with Doppler studies performed prior [facility] medical revealing ABIs revealing moderate SFA disease and potential occluded left SFA.  She underwent arterial ultrasound and ABI which showed moderate bilateral lower extremity arterial disease with bilateral SFA disease and occlusion of bilateral ATA (details above).  Patient underwent abdominal aortogram with lower extremities May 2023 with atherectomy and drug-coated balloon angioplasty mid to distal right SFA, left SFA not treatable endovascularly (detailed above).  In follow-up she reported resolution of claudication.    Patient was last seen in the office by Almyra Deforest, PA-C on 08/22/2022 for evaluation of unstable angina at the request of [name], PA-C from [place] [facility].  Patient reported cardiac catheterization was recommended July 2023 but was never done.  Since that time she reported increasing exertional chest pain and dyspnea that began occurring multiple times a day for which she was taking 3-4 NTG every day.  Case discussed with Dr. Gwenlyn Found and it was agreed given her unstable angina she would be admitted to the hospital with  plan to undergo LHC after holding Eliquis x 3 days.  [place] 08/25/2022 PCI with DES to OM SVG (details above).  Today, patient is accompanied by her daughter. She denies  shortness of breath or dyspnea on exertion. No chest pain, pressure, or tightness. Denies lower extremity edema, orthopnea, or PND. No palpitations.  She complains of lightheadedness after taking her medications in the morning with occasional nausea.  She states she felt great for the first 3 to 4 days after coming home from the hospital but since then has had increased episodes of lightheadedness after taking medications.  When questioned further she reports symptoms of lightheadedness occur 5 minutes after taking medications and then she feels like she has to lay down.  Patient did not take any medications this morning.  Intake BP 176/110, 162/90.  Repeat BP 160/90.  She denies headaches or vision changes.  Orthostatic BP as listed below.  Patient did not have any feelings of lightheadedness or dizziness.  Patient does not have a BP cuff at home.  Blood pressure was well-controlled in the hospital.  Patient continues to smoke half pack a day.  She is not interested in quitting.   ROS: All other systems reviewed and are otherwise negative except as noted in History of Present Illness.  Studies Reviewed    ECG personally reviewed by me today: NSR, 81 bpm nonspecific T wave.  No significant changes from 08/26/2022.  Risk Assessment/Calculations      HYPERTENSION CONTROL Vitals:   09/15/22 1017 09/15/22 1025  BP: (!) 176/110 (!) 162/90    The patient's blood pressure is elevated above target today.  In order to address the patient's elevated BP: Blood pressure will be monitored at home to determine if medication changes need to be made.           Physical Exam    VS:  BP (!) 162/90 (BP Location: Left Arm, Patient Position: Sitting, Cuff Size: Normal)   Pulse 81   Ht '5\' 5"'$  (1.651 m)   Wt 188 lb 6.4 oz (85.5 kg)   BMI 31.35 kg/m  , BMI Body mass index is 31.35 kg/m. Orthostatic VS for the past 24 hrs (Last 3 readings):  BP- Lying Pulse- Lying BP- Sitting Pulse- Sitting BP- Standing at 0  minutes Pulse- Standing at 0 minutes BP- Standing at 3 minutes Pulse- Standing at 3 minutes  09/15/22 1044 (!) 221/119 81 (!) 226/107 83 (!) 217/127 88 (!) 195/106 81    GEN: Well nourished, well developed, in no acute distress. Neck: No JVD or carotid bruits. Cardiac:  RRR. No murmurs. No rubs or gallops.   Respiratory:  Respirations regular and unlabored. Clear to auscultation without rales, wheezing or rhonchi. GI: Soft, nontender, nondistended. Extremities: Radials/DP/PT 2+ and equal bilaterally. No clubbing or cyanosis. No edema.  Skin: Warm and dry, no rash. Neuro: Strength intact.  Assessment & Plan   CAD.  S/p CABG x 10 July 2014 performed at outside facility.  PCI with DES to OM SVG February 2024, patent LIMA to LAD with collaterals to occluded RCA, remaining vein grafts occluded.  Patient denies chest pain, pressure, or tightness.  She is interested in attending cardiac rehab in [facility] as it is closer to her house.  It appears after reviewing care everywhere that she has been contacted to get that set up.  Her daughter states she will call back today to schedule her.  Continue aspirin, clopidogrel, Eliquis, carvedilol, Repatha, atorvastatin, as needed SL NTG.  Patient may stop aspirin after 09/23/2022. PAD.  S/p atherectomy and drug-coated balloon angioplasty right SFA.  Patient denies claudication.  Continue clopidogrel, Eliquis, atorvastatin, Repatha. PAF.  Onset s/p CABG 2016.  48-hour Holter performed at outside facility June 2016 showed sinus rhythm with PACs and PVCs.  Patient denies palpitations.  She is not having any bleeding issues.  Continue carvedilol and Eliquis. Appropriate Eliquis dose. Lightheadedness.  Patient reports feeling lightheaded 5 minutes after taking her morning medications.  She reports she gets up in the morning and then goes into the kitchen to take her medications and after about 5 minutes later she feels "woozy" and lightheaded.  She reports sometimes  nauseated.  She states sometimes she will only have coffee in the morning and then go back to bed because she feels bad but will not have anything to eat.  Patient has type 2 diabetes but does not check her sugar at home.  Orthostatic BP taken today with BP very high but no signs of orthostasis.  Wonder if she is experiencing symptoms of low blood sugar.  She is going to start checking her blood sugar at home to see if it is dropping pursing in the morning. Hypertension.  BP today 176/110, 162/90 on intake and 160/90 on repeat.  Patient did not take any of her medications today.  Patient denies headaches or vision changes.  Continue amlodipine, carvedilol, hydrochlorothiazide, losartan.  Orthostatic blood pressures taken with SBP >200 and DBP >100 but no significant drop in BP from laying to sitting and sitting to standing.  Recheck of BP prior to patient leaving office was 160/90.  Patient was instructed to take medications at home.  Given I do not know what her BP is doing on medications I will provide patient with a prescription to obtain a BP cuff and a blood pressure log to monitor BP at home.  She is instructed to contact the office in 2 weeks with blood pressure readings.  She verbalized understanding.  If BP is not controlled at home will refer to Dr. Oval Linsey at hypertension clinic. Hyperlipidemia.  LDL February 2024 109, not at goal.  Continue atorvastatin and Repatha.  Repeat lipid panel and LFTs in about 6 weeks.  Will obtain a BMP today Tobacco abuse.  Patient continues to smoke half a pack a day.  Discussed systematically cutting back cigarettes at a pace she is comfortable with and replacing it with a different (healthy) habit.  She is very interested and is going to try it.  Disposition: BMP today.  Repeat lipid panel and LFTs in 6 weeks.  Prescription provided to obtain BP cuff.  BP log provided.  Patient is instructed to contact office in 2 weeks with BP readings.  Patient will start cardiac  rehab in [facility] as it is closer to her home.  Return in 3 months or sooner as needed.         Signed, Justice Britain. Zahi Plaskett, DNP, NP-C

## 2022-09-15 ENCOUNTER — Ambulatory Visit: Payer: Medicare HMO | Attending: Physician Assistant | Admitting: Student

## 2022-09-15 ENCOUNTER — Encounter: Payer: Self-pay | Admitting: Student

## 2022-09-15 VITALS — BP 162/90 | HR 81 | Ht 65.0 in | Wt 188.4 lb

## 2022-09-15 DIAGNOSIS — I480 Paroxysmal atrial fibrillation: Secondary | ICD-10-CM

## 2022-09-15 DIAGNOSIS — I10 Essential (primary) hypertension: Secondary | ICD-10-CM

## 2022-09-15 DIAGNOSIS — I25810 Atherosclerosis of coronary artery bypass graft(s) without angina pectoris: Secondary | ICD-10-CM

## 2022-09-15 DIAGNOSIS — I739 Peripheral vascular disease, unspecified: Secondary | ICD-10-CM

## 2022-09-15 DIAGNOSIS — E785 Hyperlipidemia, unspecified: Secondary | ICD-10-CM | POA: Diagnosis not present

## 2022-09-15 DIAGNOSIS — R42 Dizziness and giddiness: Secondary | ICD-10-CM

## 2022-09-15 DIAGNOSIS — Z720 Tobacco use: Secondary | ICD-10-CM

## 2022-09-15 LAB — BASIC METABOLIC PANEL
BUN/Creatinine Ratio: 27 — ABNORMAL HIGH (ref 9–23)
BUN: 22 mg/dL (ref 6–24)
CO2: 23 mmol/L (ref 20–29)
Calcium: 9.9 mg/dL (ref 8.7–10.2)
Chloride: 103 mmol/L (ref 96–106)
Creatinine, Ser: 0.83 mg/dL (ref 0.57–1.00)
Glucose: 119 mg/dL — ABNORMAL HIGH (ref 70–99)
Potassium: 4.9 mmol/L (ref 3.5–5.2)
Sodium: 139 mmol/L (ref 134–144)
eGFR: 82 mL/min/{1.73_m2} (ref >59)

## 2022-09-15 NOTE — Patient Instructions (Signed)
Medication Instructions:  Your physician recommends that you continue on your current medications as directed. Please refer to the Current Medication list given to you today.  *If you need a refill on your cardiac medications before your next appointment, please call your pharmacy*  Please take your blood pressure daily for 2 weeks and send in a MyChart message. Please include heart rates.   HOW TO TAKE YOUR BLOOD PRESSURE: Rest 5 minutes before taking your blood pressure. Don't smoke or drink caffeinated beverages for at least 30 minutes before. Take your blood pressure before (not after) you eat. Sit comfortably with your back supported and both feet on the floor (don't cross your legs). Elevate your arm to heart level on a table or a desk. Use the proper sized cuff. It should fit smoothly and snugly around your bare upper arm. There should be enough room to slip a fingertip under the cuff. The bottom edge of the cuff should be 1 inch above the crease of the elbow. Ideally, take 3 measurements at one sitting and record the average.    Lab Work: Your physician recommends that you have the following lab drawn today: BMET  If you have labs (blood work) drawn today and your tests are completely normal, you will receive your results only by: MyChart Message (if you have MyChart) OR A paper copy in the mail If you have any lab test that is abnormal or we need to change your treatment, we will call you to review the results.   Testing/Procedures: NONE   Follow-Up: At [facility], you and your health needs are our priority.  As part of our continuing mission to provide you with exceptional heart care, we have created designated Provider Care Teams.  These Care Teams include your primary Cardiologist (physician) and Advanced Practice Providers (APPs -  Physician Assistants and Nurse Practitioners) who all work together to provide you with the care you need, when you need it.  We  recommend signing up for the patient portal called "MyChart".  Sign up information is provided on this After Visit Summary.  MyChart is used to connect with patients for Virtual Visits (Telemedicine).  Patients are able to view lab/test results, encounter notes, upcoming appointments, etc.  Non-urgent messages can be sent to your provider as well.   To learn more about what you can do with MyChart, go to [email or web address].    Your next appointment:   3 month(s)  Provider:   Mayra Reel, NP

## 2022-09-19 ENCOUNTER — Ambulatory Visit (HOSPITAL_COMMUNITY): Admission: RE | Admit: 2022-09-19 | Payer: Medicare HMO | Source: Ambulatory Visit

## 2022-11-08 ENCOUNTER — Other Ambulatory Visit: Payer: Self-pay | Admitting: Physician Assistant

## 2022-11-17 ENCOUNTER — Ambulatory Visit (HOSPITAL_COMMUNITY): Payer: Medicare HMO | Attending: Internal Medicine

## 2022-12-13 NOTE — Progress Notes (Deleted)
Cardiology Clinic Note   Date: 12/13/2022 ID: KERIONNA SMILOWITZ, DOB [birth year], MRN [id number]  Primary Cardiologist:  Nanetta Batty, MD  Patient Profile    Rachael Becker is a 57 y.o. female who presents to the clinic today for ***  Past medical history significant for: CAD. CABG x 4 (performed at outside facility) January 2016. Echo 08/23/2022: EF 55%.  RWMA.  Moderate LVH.  Grade II DD.  Elevated LVEDP.  Mild LAE.  Aortic valve sclerosis without stenosis. LHC 08/25/2022 (unstable angina): Proximal to mid RCA 100%.  Ostial LM 99%.  Proximal to mid LAD 100%.  Origin to proximal graft 100%.  Proximal graft 100%.  Distal Cx 99%.  PCI with DES 2.5 x 16 to OM SVG.  Patent LIMA to LAD with left-to-right collaterals to occluded (not grafted) RCA.  Remaining vein grafts occluded.  Recommend triple therapy with aspirin, clopidogrel, and Eliquis for 1 month then discontinue aspirin. PAD. Vascular ultrasound aorta/IVC/iliac 08/05/2021: Aortoiliac atherosclerosis.  Widely patent bilateral common and external iliac arteries without evidence of stenosis.  Patent IVC. Arterial ultrasound lower extremities 08/06/2021: Right proximal SFA distal to the ostium 50 to 74%, tandem lesions mid SFA 75 to 99%.  Two-vessel runoff via PTA and peroneal artery.  Mid and distal ATA occlusion noted.  Left occluded ostial SFA with reconstitution of minimal flow further distally in the proximal segment.  One-vessel runoff via PTA.  Mid and distal ATA occlusion, possible occlusion in the proximal peroneal artery. Abdominal aortogram with lower extremity 11/07/2021: Atherectomy followed by drug-coated balloon angioplasty mid and distal right SFA.  Left SFA not treatable endovascularly.  Recommend triple therapy with low-dose aspirin, clopidogrel, and Eliquis for 30 days followed by discontinuation of aspirin.. Arterial ultrasound lower extremities and ABIs 11/15/2021: Right mid SFA 30 to 49%.  Right ABI within normal range without  evidence of significant lower extremity arterial disease.  Moderate left lower extremity arterial disease. PAF. Onset following CABG January 2016. 48-hour Holter (performed at an outside facility) June 2016: Showed sinus rhythm with PACs and PVCs. Hypertension. Hyperlipidemia. Lipid panel 08/23/2022: LDL 109, HDL 47, TG 115, total 179. T2DM. Tobacco abuse. Continues smoke half pack a day. COPD. OSA.   History of Present Illness    Rachael Becker was first evaluated by Dr. Allyson Sabal on 07/16/2021 for PAD at the request of Arnette Felts, PA-C from [place] [facility]. Patient presented to Dr. Allyson Sabal with a 5 to 6-year history of right greater than left claudication with Doppler studies performed prior at [facility] medical revealing moderate SFA disease and potential occluded left SFA. She underwent arterial ultrasound and ABI which showed moderate bilateral lower extremity arterial disease with bilateral SFA disease and occlusion of bilateral ATA (details above). Patient underwent abdominal aortogram with lower extremities May 2023 with atherectomy and drug-coated balloon angioplasty mid to distal right SFA, left SFA not treatable endovascularly (detailed above). In follow-up she reported resolution of claudication.   In February 2024 patient was evaluated for unstable angina with complaints of increasing exertional chest pain and dyspnea occurring multiple times a day for which she was treating with 3-4 NTG.  It she underwent PCI with DES to OM SVG.  Patient was last seen in the office by me on 09/15/2022 with complaints of lightheadedness 5 minutes after taking medications.  She was hypertensive during her visit however she had not taken her medications.  She was instructed to: BP from home after taking medications.  It does not appear she contacted  the office with those readings.  Today, patient ***  Stopped aspirin?  CAD.  S/p CABG x 10 July 2014 performed at outside facility.  PCI with DES to  OM SVG February 2024, patent LIMA to LAD with collaterals to occluded RCA, remaining vein grafts occluded.  Patient*** Continue clopidogrel, Eliquis, carvedilol, Repatha, atorvastatin, as needed SL NTG.   PAD.  S/p atherectomy and drug-coated balloon angioplasty right SFA.  Patient denies claudication.  Continue clopidogrel, Eliquis, atorvastatin, Repatha.*** PAF.  Onset s/p CABG 2016.  48-hour Holter performed at outside facility June 2016 showed sinus rhythm with PACs and PVCs.  Patient denies palpitations.  She is not having any bleeding issues.  Continue carvedilol and Eliquis. Appropriate Eliquis dose.*** Lightheadedness.  Patient*** Hypertension.  BP today***.  Patient denies headaches or vision changes.  Continue amlodipine, carvedilol, hydrochlorothiazide, losartan.*** Hyperlipidemia.  LDL February 2024 109, not at goal.  Continue atorvastatin and Repatha.*** Tobacco abuse.  Patient continues to smoke half a pack a day.***     ROS: All other systems reviewed and are otherwise negative except as noted in History of Present Illness.  Studies Reviewed    ECG personally reviewed by me today: ***  No significant changes from ***  Risk Assessment/Calculations    {Does this patient have ATRIAL FIBRILLATION?[phone]} No BP recorded.  {Refresh Note OR Click here to enter BP  :1}***        Physical Exam    VS:  There were no vitals taken for this visit. , BMI There is no height or weight on file to calculate BMI.  GEN: Well nourished, well developed, in no acute distress. Neck: No JVD or carotid bruits. Cardiac: *** RRR. No murmurs. No rubs or gallops.   Respiratory:  Respirations regular and unlabored. Clear to auscultation without rales, wheezing or rhonchi. GI: Soft, nontender, nondistended. Extremities: Radials/DP/PT 2+ and equal bilaterally. No clubbing or cyanosis. No edema ***  Skin: Warm and dry, no rash. Neuro: Strength intact.  Assessment & Plan   ***  Disposition:  ***     {Are you ordering a CV Procedure (e.g. stress test, cath, DCCV, TEE, etc)?   Press F2        :[id number]}   Signed, Etta Grandchild. Marybel Alcott, DNP, NP-C

## 2022-12-16 ENCOUNTER — Ambulatory Visit: Payer: Medicare HMO | Attending: Student | Admitting: Student
